# Patient Record
Sex: Female | Born: 1965 | Race: White | Hispanic: No | Marital: Married | Smoking: Never smoker
Health system: Southern US, Community
[De-identification: ages and names within clinical notes are randomized; demographics above are authoritative.]

## PROBLEM LIST (undated history)

## (undated) DIAGNOSIS — D649 Anemia, unspecified: Secondary | ICD-10-CM

## (undated) DIAGNOSIS — M1990 Unspecified osteoarthritis, unspecified site: Secondary | ICD-10-CM

## (undated) DIAGNOSIS — M179 Osteoarthritis of knee, unspecified: Secondary | ICD-10-CM

## (undated) DIAGNOSIS — F419 Anxiety disorder, unspecified: Secondary | ICD-10-CM

## (undated) DIAGNOSIS — K635 Polyp of colon: Secondary | ICD-10-CM

## (undated) DIAGNOSIS — K219 Gastro-esophageal reflux disease without esophagitis: Secondary | ICD-10-CM

## (undated) DIAGNOSIS — K5790 Diverticulosis of intestine, part unspecified, without perforation or abscess without bleeding: Secondary | ICD-10-CM

## (undated) DIAGNOSIS — Z8719 Personal history of other diseases of the digestive system: Secondary | ICD-10-CM

## (undated) DIAGNOSIS — I10 Essential (primary) hypertension: Secondary | ICD-10-CM

## (undated) DIAGNOSIS — M1710 Unilateral primary osteoarthritis, unspecified knee: Secondary | ICD-10-CM

## (undated) DIAGNOSIS — K602 Anal fissure, unspecified: Secondary | ICD-10-CM

## (undated) HISTORY — DX: Anal fissure, unspecified: K60.2

## (undated) HISTORY — DX: Anemia, unspecified: D64.9

## (undated) HISTORY — PX: KNEE ARTHROSCOPY W/ MENISCECTOMY: SHX1879

## (undated) HISTORY — PX: TONSILLECTOMY: SUR1361

## (undated) HISTORY — DX: Diverticulosis of intestine, part unspecified, without perforation or abscess without bleeding: K57.90

## (undated) HISTORY — DX: Essential (primary) hypertension: I10

---
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
## 1989-07-30 HISTORY — PX: BUNIONECTOMY: SHX129

## 1994-07-30 HISTORY — PX: TUBAL LIGATION: SHX77

## 1998-07-30 HISTORY — PX: SEPTOPLASTY: SUR1290

## 1999-05-30 ENCOUNTER — Other Ambulatory Visit: Admission: RE | Admit: 1999-05-30 | Discharge: 1999-05-30 | Payer: Self-pay | Admitting: Obstetrics and Gynecology

## 2000-06-05 ENCOUNTER — Other Ambulatory Visit: Admission: RE | Admit: 2000-06-05 | Discharge: 2000-06-05 | Payer: Self-pay | Admitting: Obstetrics and Gynecology

## 2000-06-13 ENCOUNTER — Encounter (INDEPENDENT_AMBULATORY_CARE_PROVIDER_SITE_OTHER): Payer: Self-pay | Admitting: Specialist

## 2000-06-13 ENCOUNTER — Ambulatory Visit (HOSPITAL_COMMUNITY): Admission: RE | Admit: 2000-06-13 | Discharge: 2000-06-13 | Payer: Self-pay | Admitting: General Surgery

## 2000-06-13 HISTORY — PX: ANAL FISSURE REPAIR: SHX2312

## 2001-12-16 ENCOUNTER — Other Ambulatory Visit: Admission: RE | Admit: 2001-12-16 | Discharge: 2001-12-16 | Payer: Self-pay | Admitting: Obstetrics and Gynecology

## 2003-03-23 ENCOUNTER — Other Ambulatory Visit: Admission: RE | Admit: 2003-03-23 | Discharge: 2003-03-23 | Payer: Self-pay | Admitting: Obstetrics and Gynecology

## 2004-02-10 ENCOUNTER — Ambulatory Visit (HOSPITAL_COMMUNITY): Admission: RE | Admit: 2004-02-10 | Discharge: 2004-02-10 | Payer: Self-pay | Admitting: Orthopedic Surgery

## 2004-04-17 ENCOUNTER — Other Ambulatory Visit: Admission: RE | Admit: 2004-04-17 | Discharge: 2004-04-17 | Payer: Self-pay | Admitting: Obstetrics and Gynecology

## 2004-12-11 ENCOUNTER — Ambulatory Visit (HOSPITAL_COMMUNITY): Admission: RE | Admit: 2004-12-11 | Discharge: 2004-12-11 | Payer: Self-pay | Admitting: Orthopedic Surgery

## 2005-10-11 ENCOUNTER — Ambulatory Visit: Payer: Self-pay | Admitting: Internal Medicine

## 2005-10-25 ENCOUNTER — Ambulatory Visit: Payer: Self-pay | Admitting: Internal Medicine

## 2005-10-30 ENCOUNTER — Encounter (INDEPENDENT_AMBULATORY_CARE_PROVIDER_SITE_OTHER): Payer: Self-pay | Admitting: Specialist

## 2005-10-30 ENCOUNTER — Ambulatory Visit: Payer: Self-pay | Admitting: Internal Medicine

## 2005-11-15 ENCOUNTER — Ambulatory Visit (HOSPITAL_COMMUNITY): Admission: RE | Admit: 2005-11-15 | Discharge: 2005-11-15 | Payer: Self-pay | Admitting: Obstetrics and Gynecology

## 2005-11-15 ENCOUNTER — Encounter (INDEPENDENT_AMBULATORY_CARE_PROVIDER_SITE_OTHER): Payer: Self-pay | Admitting: *Deleted

## 2005-11-15 HISTORY — PX: OTHER SURGICAL HISTORY: SHX169

## 2005-12-12 ENCOUNTER — Ambulatory Visit: Payer: Self-pay | Admitting: Internal Medicine

## 2006-11-18 ENCOUNTER — Ambulatory Visit (HOSPITAL_COMMUNITY): Admission: RE | Admit: 2006-11-18 | Discharge: 2006-11-18 | Payer: Self-pay | Admitting: Specialist

## 2008-04-12 DIAGNOSIS — Z862 Personal history of diseases of the blood and blood-forming organs and certain disorders involving the immune mechanism: Secondary | ICD-10-CM

## 2008-04-12 DIAGNOSIS — K219 Gastro-esophageal reflux disease without esophagitis: Secondary | ICD-10-CM

## 2008-04-12 DIAGNOSIS — Z8719 Personal history of other diseases of the digestive system: Secondary | ICD-10-CM

## 2008-04-12 DIAGNOSIS — K2980 Duodenitis without bleeding: Secondary | ICD-10-CM | POA: Insufficient documentation

## 2008-04-12 DIAGNOSIS — K589 Irritable bowel syndrome without diarrhea: Secondary | ICD-10-CM

## 2008-04-14 ENCOUNTER — Ambulatory Visit: Payer: Self-pay | Admitting: Internal Medicine

## 2008-04-14 DIAGNOSIS — K602 Anal fissure, unspecified: Secondary | ICD-10-CM | POA: Insufficient documentation

## 2008-04-14 DIAGNOSIS — R1013 Epigastric pain: Secondary | ICD-10-CM

## 2008-04-14 DIAGNOSIS — K3189 Other diseases of stomach and duodenum: Secondary | ICD-10-CM

## 2008-04-14 DIAGNOSIS — R1012 Left upper quadrant pain: Secondary | ICD-10-CM

## 2008-04-21 ENCOUNTER — Ambulatory Visit (HOSPITAL_COMMUNITY): Admission: RE | Admit: 2008-04-21 | Discharge: 2008-04-21 | Payer: Self-pay | Admitting: Internal Medicine

## 2008-04-23 ENCOUNTER — Encounter: Payer: Self-pay | Admitting: Internal Medicine

## 2008-04-29 ENCOUNTER — Ambulatory Visit: Payer: Self-pay | Admitting: Internal Medicine

## 2008-04-29 LAB — CONVERTED CEMR LAB
ALT: 19 units/L (ref 0–35)
Basophils Absolute: 0 10*3/uL (ref 0.0–0.1)
Basophils Relative: 0.7 % (ref 0.0–3.0)
Creatinine, Ser: 1 mg/dL (ref 0.4–1.2)
GFR calc non Af Amer: 65 mL/min
Hemoglobin: 12.8 g/dL (ref 12.0–15.0)
Monocytes Absolute: 0.3 10*3/uL (ref 0.1–1.0)
Neutro Abs: 4.2 10*3/uL (ref 1.4–7.7)
Neutrophils Relative %: 69.7 % (ref 43.0–77.0)
RBC: 4.29 M/uL (ref 3.87–5.11)
Retic Ct Pct: 1.7 % (ref 0.4–3.1)
Sodium: 141 meq/L (ref 135–145)

## 2008-05-31 ENCOUNTER — Ambulatory Visit: Payer: Self-pay | Admitting: Internal Medicine

## 2008-06-02 ENCOUNTER — Ambulatory Visit: Payer: Self-pay | Admitting: Cardiovascular Disease

## 2008-06-02 ENCOUNTER — Ambulatory Visit: Payer: Self-pay | Admitting: Internal Medicine

## 2008-06-02 ENCOUNTER — Ambulatory Visit (HOSPITAL_COMMUNITY): Admission: RE | Admit: 2008-06-02 | Discharge: 2008-06-02 | Payer: Self-pay | Admitting: Cardiology

## 2008-06-03 ENCOUNTER — Ambulatory Visit: Payer: Self-pay

## 2008-06-03 ENCOUNTER — Encounter: Payer: Self-pay | Admitting: Cardiovascular Disease

## 2008-06-03 HISTORY — PX: TRANSTHORACIC ECHOCARDIOGRAM: SHX275

## 2008-06-05 ENCOUNTER — Encounter: Payer: Self-pay | Admitting: Internal Medicine

## 2008-06-07 ENCOUNTER — Telehealth: Payer: Self-pay | Admitting: Internal Medicine

## 2008-06-10 ENCOUNTER — Inpatient Hospital Stay (HOSPITAL_COMMUNITY): Admission: RE | Admit: 2008-06-10 | Discharge: 2008-06-12 | Payer: Self-pay | Admitting: Specialist

## 2008-06-10 HISTORY — PX: OTHER SURGICAL HISTORY: SHX169

## 2008-10-12 ENCOUNTER — Telehealth: Payer: Self-pay | Admitting: Internal Medicine

## 2008-10-14 ENCOUNTER — Ambulatory Visit: Payer: Self-pay | Admitting: Internal Medicine

## 2008-10-14 DIAGNOSIS — R110 Nausea: Secondary | ICD-10-CM

## 2008-10-14 DIAGNOSIS — K209 Esophagitis, unspecified: Secondary | ICD-10-CM

## 2008-10-15 DIAGNOSIS — R161 Splenomegaly, not elsewhere classified: Secondary | ICD-10-CM | POA: Insufficient documentation

## 2008-10-25 ENCOUNTER — Ambulatory Visit: Payer: Self-pay | Admitting: Internal Medicine

## 2008-10-25 ENCOUNTER — Encounter: Payer: Self-pay | Admitting: Internal Medicine

## 2008-10-27 ENCOUNTER — Encounter: Payer: Self-pay | Admitting: Internal Medicine

## 2008-10-28 ENCOUNTER — Telehealth: Payer: Self-pay | Admitting: Internal Medicine

## 2010-08-20 ENCOUNTER — Encounter: Payer: Self-pay | Admitting: Specialist

## 2010-12-12 NOTE — Assessment & Plan Note (Signed)
[facility]                                 ON-CALL NOTE   NAME:Cook, Jill HEGNER                    MRN:          [id number]  DATE:06/03/2008                            DOB:          10/07/1965    She is a patient of Dr. Earney Hamburg.  I received a call from Jill Cook this morning.  She recently saw Dr. Clifton James in clinic on  June 02, 2008, with complaints of atypical chest discomfort.  At that  time, he performed a 2-D echocardiogram which I have been able to pull  up and see that this is completely normal.  She also underwent Myoview  stress testing; unfortunately, I do not have the results of that test.  This morning, she complains of some discomfort in her chest and in her  back that is worse with deep breathing and coughing.  It started last  night prior to going to bed and she was able to sleep well, but then  awoke this morning and it was still there in a mild fashion.  She is  fairly jovial on the phone and is just asking what she should do.  She  gives no indication that she is in any acute distress at all.  I  recommended that it is difficult to know whether or not her symptoms are  cardiac in nature.  I went over her echocardiogram with her to reassure  her some.  Finally, I recommended that if she is concerned about her  symptoms that she should come into the ED for further evaluation.  She  explained that she is not overly concerned.  She was just was not sure  what to do and that she will stay at home for now and see if it gets  better, but if not, then she will come into the ED.     Nicolasa Ducking, ANP  Electronically Signed    CB/MedQ  DD: 06/06/2008  DT: 06/06/2008  Job #: [id number]

## 2010-12-12 NOTE — Assessment & Plan Note (Signed)
[facility]                            CARDIOLOGY OFFICE NOTE   NAME:Guertin, DAIYA TAMER                    MRN:          [id number]  DATE:06/02/2008                            DOB:          September 14, 1965    PRIMARY CARE PHYSICIAN:  Ernestina Penna, MD   REASON FOR CONSULTATION:  Chest pain.   HISTORY OF PRESENT ILLNESS:  Ms. Simkin is a pleasant 45 year old  Caucasian female with a past medical history significant for  hyperlipidemia, gastroesophageal reflux disease, duodenitis,  esophagitis, irritable bowel syndrome, anemia and osteoarthritis of her  right knee who presents today for further evaluation of left-sided chest  pain.  The patient tells me that for the last month, she has been having  sharp left-sided chest pain that makes her bend over and clutch her  chest.  The pain lasts for 15-30 minutes and is worsened with deep  inspiration.  She denies any associated shortness of breath or  diaphoresis, but does occasionally have nausea while she is experiencing  this pain.  The pain almost always occurs while she is at rest.  Approximately 2 weeks ago, she started noticing a change in the  character of the pain.  The pain is now a heavy pressure-like sensation  over her center chest without radiation.  There are no specific  associated signs with the chest pain over the last several weeks.  The  patient has a complex history of GI problems, which could explain some  of her current symptoms.  She was most recently seen by Dr. Lina Sar  of gastroenterology for further evaluation of her left upper quadrant  abdominal pain.  Ultrasound revealed splenomegaly in September; however,  her gallbladder and liver were normal in size.  The patient has been  started on Aciphex 20 mg a day for possible gastroesophageal reflux  disease as a explanation of her pain.  Dr. Juanda Chance is continuing a workup  of the abdominal pain which is to include a CT scan of the  abdomen.  The  patient has no other complaints at the current time.   The patient has a planned partial right knee replacement for Friday,  June 04, 2008.   PAST MEDICAL HISTORY:  1. Hyperlipidemia.  2. Gastroesophageal reflux disease.  3. Duodenitis.  4. Esophagitis.  5. Irritable bowel syndrome.  6. Anemia.  7. Osteoarthritis of the right knee with plans for partial knee      replacement later this week.  8. Splenomegaly of unknown etiology.   PAST SURGICAL HISTORY:  Tonsillectomy, nasal surgery, bilateral tubal  ligation, C-section, bunion surgery and knee arthroscopic surgery.   ALLERGIES:  CODEINE, MORPHINE, DILAUDID.   CURRENT MEDICATIONS:  1. Aciphex 20 mg once daily.  2. Multivitamin once daily.  3. Lovaza 1 g 2 pills once daily (the multivitamin and Lovaza are      currently on hold).   SOCIAL HISTORY:  The patient denies use of tobacco or illicit drugs.  She does occasionally have an alcoholic beverage.  She is married and  has 3 children.  She is employed in the desk  job and Insurance account manager.   FAMILY HISTORY:  The patient's mother is alive and has diabetes  mellitus.  Her father is alive and had a myocardial infarction at age of  52.  She has a younger sister and a younger brother who are both  healthy.   REVIEW OF SYSTEMS:  As stated in the history present illness and is  otherwise negative.   PHYSICAL EXAMINATION:  VITAL SIGNS:  Blood pressure 136/98, pulse 87 and  regular, respirations 12 and nonlabored.  GENERAL:  She is a pleasant young Caucasian female in no acute distress.  She is alert and oriented x3.  PSYCHIATRIC:  Mood and affect are normal.  NEUROLOGICAL:  No focal neurological deficits.  MUSCULOSKELETAL:  Muscle strength and tone are normal.  SKIN:  Warm and dry.  HEENT:  Normal.  NECK:  No JVD.  No carotid bruits.  No lymphadenopathy.  No thyromegaly.  LUNGS:  Clear to auscultation bilaterally without wheezes, rhonchi, or  crackles noted.   CARDIOVASCULAR:  Regular rate and rhythm without murmurs, gallops, or  rubs noted.  ABDOMEN:  Soft, nontender, nondistended.  Bowel sounds are present.  EXTREMITIES:  No evidence of edema.  Pulses are 2+ in all extremities.  CHEST WALL:  There is no reproducible chest pain with palpation.   DIAGNOSTIC STUDIES:  1. A 12-lead EKG obtained in our office today shows normal sinus      rhythm with a ventricular rate of 87 beats per minute and sinus      arrhythmia.  This is otherwise a normal EKG.  2. Chest x-ray obtained on June 02, 2008, is negative for acute      cardiopulmonary process.  The lung fields are both clear.  The      heart and mediastinal contours are normal.  All osseous structures      are normal.   ASSESSMENT AND PLAN:  This is a pleasant 45 year old Caucasian female  with a family history of premature coronary artery disease and a  personal history of obesity and hyperlipidemia who presents with  complaints of substernal and left-sided chest pressure and heaviness.  The pain has some atypical features and that it lasts for several hours  at times, occurs at rest and is not associated with exertion.  This pain  is also made worse at times by deep inspiration.  There are some  concerning features about the pain, however, and because of this I think  it will be most appropriate to obtain surface echocardiogram to evaluate  her left ventricular function and to rule out any pericardial effusion.  I would also like to get an adenosine Myoview to rule out any myocardial  ischemia.  We will perform this test at the earliest convenience for the  patient and we will call her with the results.  I will make no  medication changes at the current time.  We will communicate the results  with the patient and to her surgeon when the results are available.  Once again, the patient is planning to have a partial right knee  replacement done as soon as she has clearance from cardiology and  the  gastroenterology team.     Verne Carrow, MD  Electronically Signed    CM/MedQ  DD: 06/02/2008  DT: 06/03/2008  Job #: [id number]   cc:   Hedwig Morton. Juanda Chance, MD  Ernestina Penna, M.D.

## 2010-12-12 NOTE — Op Note (Signed)
Jill Cook, OSORTO             ACCOUNT NO.:  [id number]   MEDICAL RECORD NO.:  [id number]          PATIENT TYPE:  INP   LOCATION:  1608                         FACILITY:  [facility]   PHYSICIAN:  Erasmo Leventhal, M.D.DATE OF BIRTH:  06/03/1966   DATE OF PROCEDURE:  06/10/2008  DATE OF DISCHARGE:                               OPERATIVE REPORT   PREOPERATIVE DIAGNOSIS:  Right knee patellofemoral osteoarthritis.   POSTOPERATIVE DIAGNOSIS:  Right knee patellofemoral osteoarthritis.   PROCEDURE:  Right knee patellofemoral arthroplasty.   SURGEON:  Erasmo Leventhal, M.D.   ASSISTANT:  Jaquelyn Bitter. Chabon, P.A.   ANESTHESIA:  Femoral nerve block with general  anesthesia was rule  femoral nerve block with general, Dr. Shireen Quan.   BLOOD LOSS:  Less than 50 mL.   DRAINS:  One Hemovac.   COMPLICATIONS:  None.   TOURNIQUET TIME:  Seventy minutes at 300 mmHg.   COMPLICATIONS:  None.   DISPOSITION:  PACU stable.   OPERATIVE IMPLANTS:  DePuy Sigma size 1 patellofemoral trochlea and a 35  mm all polyethylene patella.   OPERATIVE DETAILS:  The patient counseled in holding after correct side  was identified.  Regional anesthetic was administered.  She was then  taken to the operating room and placed in the position.  General  anesthesia was administered and well padded bump.  Foley catheter placed  in sterile technique by OR circulating nurse.  Right lower extremity was  elevated, prepped with DuraPrep and draped in the sterile fashion.  Standard Esmarch  tourniquet inflated to 300 mmHg.  Straight midline  incision was made in the skin and subcutaneous tissue.  Medial  parapatellar arthrotomy performed making sure to protect the articular  cartilage that was uninvolved and also the menisci.  The knee was  inspected through this arthrotomy.  There were no others areas of  abnormality noted and grade 4 chondromalacia patella and softening of  femoral trochlea..  The patella was  resected initially after measuring  the appropriate thickness and a size 35 locking holes were made and a  shim was placed.   A 2 mm pilot hole was made for standard technique being 7 mm anterior to  the intercondylar notch cortical bone superiorly leaving a 2 mm rim.  Distal femoral cutting block was applied.  We made appropriate varus and  valgus and rotation based upon Whiteside's line and also epicondylar  axis.  The sagittal cuts were made, transverse cut was made at the  appropriate level making sure it was nice and flush.  Next a jig was  then placed utilizing the router and appropriate of bone was resected.  It was removed, trimmed down with rongeurs and trial was placed  following that fixation.   The knee was copiously irrigated with pulsatile lavage and utilizing  modern cement technique, all components were cemented into place.  We  used a size 35 patella with a size 1 femoral trochlea making sure we had  appropriate balance, rotated and set and appropriate coverage  throughout.  After cement had cured, excess cement removed.  Tracking  was anatomic.  She  had already undergone lateral release with previous  surgical procedure and did not require lateral release this time.  We  had anatomic tracking.  Again pulsatile lavage irrigated the knee.  The  arthrotomy was closed and the knee flexed  after drain was placed.  This  done with Vicryl suture.  Subcu with Vicryl and skin with subcuticular  Monocryl suture.  Drain was hooked to suction.  A sterile dressing  applied.  Tourniquet deflated.  Ice packs applied.  Knee immobilizer.  She tolerated the procedure well.  There were no complications or  problems.  She was then awakened and taken from the operating room in  stable condition.   Note:  She had received preoperative antibiotics.   To help with surgical technique and good decision making, Mr. Leilani Able PA-C assistance was needed for the entire case.            ______________________________  Erasmo Leventhal, M.D.     RAC/MEDQ  D:  06/10/2008  T:  06/10/2008  Job:  [id number]

## 2010-12-15 NOTE — Discharge Summary (Signed)
Jill Cook, Jill Cook             ACCOUNT NO.:  [id number]   MEDICAL RECORD NO.:  [id number]          PATIENT TYPE:  INP   LOCATION:  1608                         FACILITY:  [facility]   PHYSICIAN:  Erasmo Leventhal, M.D.DATE OF BIRTH:  01-10-1966   DATE OF ADMISSION:  06/10/2008  DATE OF DISCHARGE:  06/12/2008                               DISCHARGE SUMMARY   ADMITTING DIAGNOSIS:  Right knee patellofemoral arthritis.   DISCHARGE DIAGNOSIS:  Same.   OPERATION:  Right knee patellofemoral arthroplasty.   BRIEF HISTORY:  This is a 45 year old lady with a history of  patellofemoral arthritis with failure of conservative treatment  including scope and lateral release to alleviate her pain.  After  discussion of treatment benefits, risks and options, the patient now  scheduled for patellofemoral arthroplasty of the right knee.   LABORATORY VALUES:  Admission CBC within normal limits.  Admission BMET  showed a sodium slightly low at 133, glucose high at 120.  Hemoglobin/hematocrit on discharge 11.8 and 34.4, sodium was back up to  normal at 138 on discharge.  Urinalysis within normal limits.   COURSE IN THE HOSPITAL:  The patient tolerated the op procedure well.  First postoperative day she was feeling okay, vital signs stable,  afebrile, CBC normal, lungs clear, heart sounds normal, bowel sounds  active, calves negative, dressing was dry, drain was removed and she was  started on physical therapy.  Second postoperative day she was  comfortable, vital signs stable, afebrile, labs were stable, her calves  were negative, her dressing was changed, her wound was benign,  neurovascular status was intact and plans were made for discharge today  after her physical therapy if she did well.   CONDITION ON DISCHARGE:  Improved.   DISCHARGE MEDICATIONS:  1. Oxycodone 5 mg one to two q.6 h p.r.n. pain.  2. Robaxin 500 mg one p.o. q.8 h p.r.n. spasm.  3. Aspirin 325 mg one pill twice a day for  3 weeks.   DISCHARGE INSTRUCTIONS:  She is to do ankle pumps, tighten and relax the  leg muscles frequently each hour.  She is to ambulate with assistance in  her crutches, wear brace when up and about.  Call the office for recheck  in 2 weeks or call sooner p.r.n. problems.  She is to keep the wound  clean and dry for a total of 3 weeks and she is to follow a low sodium,  heart healthy diet.      Jaquelyn Bitter. Chabon, P.A.    ______________________________  Erasmo Leventhal, M.D.    SJC/MEDQ  D:  06/30/2008  T:  06/30/2008  Job:  [id number]

## 2010-12-15 NOTE — H&P (Signed)
NAME:  Jill Cook, Jill Cook             ACCOUNT NO.:  [id number]   MEDICAL RECORD NO.:  [id number]          PATIENT TYPE:  AMB   LOCATION:  SDC                           FACILITY:  [facility]   PHYSICIAN:  Duke Salvia. Marcelle Overlie, M.D.DATE OF BIRTH:  [birth year]   DATE OF ADMISSION:  DATE OF DISCHARGE:                                HISTORY & PHYSICAL   CHIEF COMPLAINT:  Menorrhagia.   HISTORY OF PRESENT ILLNESS:  A 45 year old G2, P2 has 3 living children,  prior tubal, 1 set of twins, has been bothered with menorrhagia that has not  responded to conservative measures. She underwent FSG in our office dated  October 26, 2005 which showed a 3.1 x 3.2 intramural fibroid with a small  polyp noted. She presents now for [facility] hysteroscopy and NovaSure endometrial  ablation. This procedure including risks of bleeding, infection, adjacent  organ injury and the possible need for open or additional surgery were all  reviewed with her which she understands and accepts.   ALLERGIES:  DILAUDID, CODEINE and MORPHINE.   CURRENT MEDICATIONS:  None.   OBSTETRICAL HISTORY:  One vaginal delivery, one Cesarean section, tubal  ligation.   FAMILY HISTORY:  Significant for great-grandmother with twin gestation.  Otherwise negative.   PHYSICAL EXAMINATION:  VITAL SIGNS:  Temperature 98.2, blood pressure  120/78.  HEENT:  Unremarkable.  NECK:  Supple without masses.  LUNGS:  Clear.  CARDIOVASCULAR:  Regular rate and rhythm without murmurs, rubs, or gallops  noted.  BREASTS:  Without masses.  ABDOMEN:  Soft, flat, nontender.  PELVIC:  Normal external genitalia. Vagina and cervix clear. Uterus in good  position, normal size. Adnexa negative.   IMPRESSION:  Symptomatic leiomyoma, endometrial polyp, menorrhagia.   PLAN:  D&C hysteroscopy with NovaSure endometrial ablation. Procedure and  risks reviewed as above.      Richard M. Marcelle Overlie, M.D.  Electronically Signed     RMH/MEDQ  D:  11/12/2005  T:  11/12/2005   Job:  [id number]

## 2010-12-15 NOTE — Op Note (Signed)
NAME:  Jill Cook, Jill Cook             ACCOUNT NO.:  [id number]   MEDICAL RECORD NO.:  [id number]          PATIENT TYPE:  AMB   LOCATION:  SDC                           FACILITY:  [facility]   PHYSICIAN:  Duke Salvia. Marcelle Overlie, M.D.DATE OF BIRTH:  1966/03/16   DATE OF PROCEDURE:  11/15/2005  DATE OF DISCHARGE:                                 OPERATIVE REPORT   PREOPERATIVE DIAGNOSIS:  Endometrial polyp, menorrhagia with abnormal  uterine bleeding.  Leiomyoma.   POSTOPERATIVE DIAGNOSIS:  Endometrial polyp, menorrhagia with abnormal  uterine bleeding.  Leiomyoma.   OPERATION PERFORMED:  Dilation and curettage, hysteroscopy, NovaSure EMA.   SURGEON:  Duke Salvia. Marcelle Overlie, M.D.   ANESTHESIA:  General endotracheal.   COMPLICATIONS:  None.   DRAINS:  In and out Foley catheter.   ESTIMATED BLOOD LOSS:  Minimal.   SPECIMENS:  Endometrial curettings with endometrial polyp.   DESCRIPTION OF PROCEDURE:  The patient was taken to the operating room.  After an adequate level of general endotracheal anesthesia was obtained, the  patient was placed in stirrups.  The perineum and vagina were prepped and  draped with Betadine.  The bladder was drained.  Examination under  anesthesia carried out.  The uterus was mid posterior, normal size, adnexa  negative.  Speculum was positioned.  Cervix grasped with a tenaculum.  Paracervical block infiltrated at 4, 8, 10 and 2. A deep paracervical block  total of 16 mL 1% Xylocaine after negative aspiration.  The uterus was then  sounded to 9.5 cm.  Cervical length of 3.0 progressively dilated to a 29  Pratt dilator.  A 7 mm continuous flow hysteroscope was then inserted.  A  small benign appearing posterior wall polyp was noted.  The remainder of the  cavity was normal.  The polyp was removed by dilation and curettage.  The  scope was reinserted, the cavity irrigated and noted to be clear.  NovaSure  was then carried out per protocol without complication.  A width of  4.5,  power setting of 161 for 112 seconds.  She tolerated this well, did receive  Toradol intraoperative and went to recovery room in good condition.      Richard M. Marcelle Overlie, M.D.  Electronically Signed     RMH/MEDQ  D:  11/15/2005  T:  11/16/2005  Job:  [id number]

## 2010-12-15 NOTE — H&P (Signed)
NAME:  Jill, Cook             ACCOUNT NO.:  [id number]   MEDICAL RECORD NO.:  [id number]          PATIENT TYPE:  INP   LOCATION:                               [facility]ACILITY[facility]   PHYSICIAN:  Erasmo Leventhal, M.D.DATE OF BIRTH:  [birth year]   DATE OF ADMISSION:  06/04/2008  DATE OF DISCHARGE:                              HISTORY & PHYSICAL   CHIEF COMPLAINT:  Right knee patellofemoral arthritis.   HISTORY OF PRESENT ILLNESS:  This is a 45 year old lady with a history  patellofemoral arthritis with failure of conservative treatment,  including scope and lateral release to alleviate her pain.  Due to daily  pain and discomfort, she comes in now for H and P prior to  patellofemoral arthroplasty of her right knee.  The surgery, risks,  benefits, and aftercare were discussed in detail with the patient,  questions invited and answered, and surgery to go ahead as scheduled.   PAST MEDICAL HISTORY:   DRUG ALLERGIES:  CODEINE, DILANTIN, and MORPHINE, all with nausea,  vomiting, and hives.   CURRENT MEDICATIONS:  1. Nexium one daily.  2. Fish oil.  3. Vitamins.   PAST SURGICAL HISTORY:  1. Tonsillectomy.  2. Bunionectomy.  3. C-section.  4. Tubal ligation.  5. Septoplasty.  6. Lateral release surgery of the right knee.   SERIOUS MEDICAL ILLNESSES:  Include hypercholesterolemia.   FAMILY HISTORY:  Positive for MI, coronary artery disease, diabetes, and  hypertension.   SOCIAL HISTORY:  The patient is married.  She works in Insurance account manager.  She  does not smoke and drinks occasionally.   REVIEW OF SYSTEMS:  CENTRAL NERVOUS SYSTEM:  Negative for headache,  blurred vision, or dizziness.  PULMONARY:  Negative for shortness  breath, PND, or orthopnea.  CARDIOVASCULAR:  Positive for  hypercholesterolemia.  Negative for chest pain or palpitation.  GI:  Positive for GERD.  GU:  Negative for urinary tract difficulty.  MUSCULOSKELETAL:  Positive as in HPI.   PHYSICAL EXAMINATION:   VITAL SIGNS:  BP 132/78, pulse 82 and regular,  respirations 16.  GENERAL APPEARANCE:  This is a well-developed, well-nourished lady in no  acute distress.  HEENT:  Head normocephalic.  Nose patent.  Ears patent.  Pupils are  equal, round, and react to light.  Throat without injection.  NECK:  Supple without adenopathy.  Carotids 2+ without bruit.  CHEST:  Clear to auscultation.  No rales or rhonchi.  Respirations 16.  HEART:  Regular rate and rhythm at 82 beats per minute without murmur.  ABDOMEN:  Soft with active bowel sounds.  No masses or organomegaly.  NEUROLOGIC:  The patient is alert and oriented to time, place, and  person.  Cranial nerves II through XII are grossly intact.  EXTREMITIES:  Right knee with significant patellofemoral crepitation.  Positive inhibition test and pain with any loaded extension.  Neurovascular status intact.  Dorsalis pedis and posterior tibialis  pulses are 2+.   X-rays show patellofemoral arthritis, right knee.   IMPRESSION:  Patellofemoral osteoarthritis, right knee.   PLAN:  Patellofemoral arthroplasty, right knee.      [name], P.A.    ______________________________  Erasmo Leventhal, M.D.    SJC/MEDQ  D:  05/10/2008  T:  05/10/2008  Job:  [id number]

## 2010-12-15 NOTE — Op Note (Signed)
[facility]  Patient:    Jill Cook, Jill Cook                    MRN: [id number] Proc. Date: 06/13/00 Adm. Date:  [id number] Attending:  Carson Myrtle                           Operative Report  PREOPERATIVE DIAGNOSIS:  Anal fissure.  POSTOPERATIVE DIAGNOSIS:  Anal fissure.  PROCEDURE:  Repair of anal fissure.  SURGEON:  Timothy E. Earlene Plater, M.D.  ANESTHESIA:  General.  INDICATIONS:  This is a 45 year old female with a persistent anal fissure and failure to respond to conservative management.  She wishes to proceed with a surgical repair.  This has been carefully explained.  DESCRIPTION OF PROCEDURE:  The patient was brought to the operating room and placed supine.  LMA anesthesia was provided, and she was then placed in the lithotomy position.  Inspection and preparation of the perineal area was accomplished.  Relaxation of the perineum was notable, understanding that this patient although under general anesthesia had no muscle relaxant.  The anus was patchulous.  There was a moderate rectocele and general relaxation.  I could identify the IC groove and palpate the internal sphincter; but, because of the existing relaxation I was hesitant to perform an internal sphincterotomy as is usually done.  Therefore, after careful inspection of the perianal area and enterorectum by anoscopy, I simply debrided the fissure and excised the external tag which was prominent.  I then cauterized the edges and this completed the repair on this particular situation.  Gelfoam gauze and a dry sterile dressing was applied.  She tolerated it well and was removed to recovery room in good condition.  Written and verbal instructions were given to the patient and her husband. She will be seen in follow up as an outpatient. DD:  06/13/00 TD:  06/13/00 Job: [id number]

## 2011-05-01 LAB — CBC
HCT: 34.4 — ABNORMAL LOW
HCT: 36.7
Hemoglobin: 11.8 — ABNORMAL LOW
Hemoglobin: 12.7
Hemoglobin: 12.7
MCHC: 34.2
MCV: 87.6
Platelets: 177
Platelets: 206
RBC: 3.92
RBC: 4.32
WBC: 6.3
WBC: 8.8

## 2011-05-01 LAB — BASIC METABOLIC PANEL
BUN: 3 — ABNORMAL LOW
Calcium: 8.6
Calcium: 8.7
Chloride: 102
Creatinine, Ser: 0.76
GFR calc Af Amer: >60
GFR calc non Af Amer: >60
Glucose, Bld: 107 — ABNORMAL HIGH
Glucose, Bld: 120 — ABNORMAL HIGH
Potassium: 3.8
Sodium: 133 — ABNORMAL LOW

## 2011-05-01 LAB — CROSSMATCH
ABO/RH(D): A POS
Antibody Screen: NEGATIVE

## 2011-05-01 LAB — URINALYSIS, ROUTINE W REFLEX MICROSCOPIC
Bilirubin Urine: NEGATIVE
Glucose, UA: NEGATIVE
Ketones, ur: NEGATIVE
Ketones, ur: NEGATIVE
Protein, ur: NEGATIVE
Specific Gravity, Urine: 1.004 — ABNORMAL LOW
Urobilinogen, UA: 0.2
pH: 7
pH: 7

## 2011-05-01 LAB — DIFFERENTIAL
Basophils Absolute: 0
Basophils Relative: 1
Eosinophils Absolute: 0.1
Eosinophils Relative: 1
Lymphocytes Relative: 27
Monocytes Absolute: 0.3
Neutro Abs: 4.1
Neutrophils Relative %: 66

## 2011-05-01 LAB — COMPREHENSIVE METABOLIC PANEL
Calcium: 9.2
Creatinine, Ser: 0.82
GFR calc Af Amer: >60
Total Bilirubin: 0.8
Total Protein: 6.9

## 2011-05-01 LAB — ABO/RH: ABO/RH(D): A POS

## 2012-06-23 ENCOUNTER — Ambulatory Visit: Payer: Self-pay | Admitting: Otolaryngology

## 2012-06-30 ENCOUNTER — Encounter: Payer: Self-pay | Admitting: Otolaryngology

## 2012-07-30 ENCOUNTER — Encounter: Payer: Self-pay | Admitting: Otolaryngology

## 2013-05-11 ENCOUNTER — Other Ambulatory Visit: Payer: Self-pay | Admitting: Physician Assistant

## 2013-05-11 ENCOUNTER — Encounter: Payer: Self-pay | Admitting: Family Medicine

## 2013-05-11 NOTE — Telephone Encounter (Signed)
Medication refill for one time only.  Patient needs to be seen.  Letter sent for patient to call and schedule 

## 2013-06-21 ENCOUNTER — Other Ambulatory Visit: Payer: Self-pay | Admitting: Physician Assistant

## 2013-06-22 NOTE — Telephone Encounter (Signed)
Refill denied.  Pt aware she must be seen for refills

## 2013-06-23 ENCOUNTER — Telehealth: Payer: Self-pay | Admitting: Family Medicine

## 2013-06-23 ENCOUNTER — Encounter: Payer: Self-pay | Admitting: Family Medicine

## 2013-06-23 NOTE — Telephone Encounter (Signed)
Pt NTBS  Another letter sent to tell her so.

## 2013-06-24 ENCOUNTER — Other Ambulatory Visit: Payer: Self-pay | Admitting: Physician Assistant

## 2013-07-13 ENCOUNTER — Other Ambulatory Visit: Payer: BC Managed Care – PPO

## 2013-07-13 DIAGNOSIS — F411 Generalized anxiety disorder: Secondary | ICD-10-CM

## 2013-07-13 DIAGNOSIS — I10 Essential (primary) hypertension: Secondary | ICD-10-CM

## 2013-07-13 LAB — COMPLETE METABOLIC PANEL WITH GFR
ALT: 16 U/L (ref 0–35)
AST: 15 U/L (ref 0–37)
Albumin: 4.6 g/dL (ref 3.5–5.2)
BUN: 12 mg/dL (ref 6–23)
Calcium: 9.9 mg/dL (ref 8.4–10.5)
Chloride: 102 mEq/L (ref 96–112)
Potassium: 4.4 mEq/L (ref 3.5–5.3)
Total Bilirubin: 0.5 mg/dL (ref 0.3–1.2)
Total Protein: 6.8 g/dL (ref 6.0–8.3)

## 2013-07-13 LAB — CBC WITH DIFFERENTIAL/PLATELET
Basophils Absolute: 0 10*3/uL (ref 0.0–0.1)
Basophils Relative: 1 % (ref 0–1)
Eosinophils Absolute: 0.1 10*3/uL (ref 0.0–0.7)
Eosinophils Relative: 2 % (ref 0–5)
MCH: 30 pg (ref 26.0–34.0)
MCHC: 34.9 g/dL (ref 30.0–36.0)
MCV: 85.7 fL (ref 78.0–100.0)
Platelets: 190 10*3/uL (ref 150–400)
RDW: 14 % (ref 11.5–15.5)

## 2013-07-13 LAB — LIPID PANEL: LDL Cholesterol: 95 mg/dL (ref 0–99)

## 2013-07-13 LAB — TSH: TSH: 1.63 u[IU]/mL (ref 0.350–4.500)

## 2013-07-16 ENCOUNTER — Ambulatory Visit (INDEPENDENT_AMBULATORY_CARE_PROVIDER_SITE_OTHER): Payer: BC Managed Care – PPO | Admitting: Family Medicine

## 2013-07-16 ENCOUNTER — Encounter: Payer: Self-pay | Admitting: Family Medicine

## 2013-07-16 VITALS — BP 110/74 | HR 78 | Temp 98.1°F | Resp 16 | Ht 66.0 in | Wt 212.0 lb

## 2013-07-16 DIAGNOSIS — Z0000 Encounter for general adult medical examination without abnormal findings: Secondary | ICD-10-CM

## 2013-07-16 DIAGNOSIS — K5790 Diverticulosis of intestine, part unspecified, without perforation or abscess without bleeding: Secondary | ICD-10-CM | POA: Insufficient documentation

## 2013-07-16 NOTE — Progress Notes (Signed)
Subjective:    Patient ID: Jill Cook, female    DOB: [birth year], 47 y.o.   MRN: [id number]  HPI Patient today for complete physical exam. She is concerned because she's had a recent cold with rhinorrhea and congestion. Otherwise she is doing well. She does complain of daily diarrhea over the last 2 years. It does seem to be triggered by eating bread and gluten-containing foods. She denies any melena or weight loss or fevers or hematochezia. She had a colonoscopy that was benign. She's previously been diagnosed with possible irritable bowel syndrome.  She also has a history of GERD, esophagitis, dyspepsia, and duodenitis.  Otherwise she is doing well. Her most recent lab work is listed below: Lab on 07/13/2013  Component Date Value Range Status  . Sodium 07/13/2013 140  135 - 145 mEq/L Final  . Potassium 07/13/2013 4.4  3.5 - 5.3 mEq/L Final  . Chloride 07/13/2013 102  96 - 112 mEq/L Final  . CO2 07/13/2013 28  19 - 32 mEq/L Final  . Glucose, Bld 07/13/2013 96  70 - 99 mg/dL Final  . BUN 62/13/0865 12  6 - 23 mg/dL Final  . Creat 78/46/9629 0.89  0.50 - 1.10 mg/dL Final  . Total Bilirubin 07/13/2013 0.5  0.3 - 1.2 mg/dL Final  . Alkaline Phosphatase 07/13/2013 54  39 - 117 U/L Final  . AST 07/13/2013 15  0 - 37 U/L Final  . ALT 07/13/2013 16  0 - 35 U/L Final  . Total Protein 07/13/2013 6.8  6.0 - 8.3 g/dL Final  . Albumin 52/84/1324 4.6  3.5 - 5.2 g/dL Final  . Calcium 40/04/2724 9.9  8.4 - 10.5 mg/dL Final  . GFR, Est African American 07/13/2013 89   Final  . GFR, Est Non African American 07/13/2013 77   Final   Comment:                            The estimated GFR is a calculation valid for adults (>=37 years old)                          that uses the CKD-EPI algorithm to adjust for age and sex. It is                            not to be used for children, pregnant women, hospitalized patients,                             patients on dialysis, or with rapidly changing kidney  function.                          According to the NKDEP, eGFR >89 is normal, 60-89 shows mild                          impairment, 30-59 shows moderate impairment, 15-29 shows severe                          impairment and <15 is ESRD.                             . WBC 07/13/2013 5.0  4.0 - 10.5 K/uL Final  . RBC 07/13/2013 4.34  3.87 - 5.11 MIL/uL Final  . Hemoglobin 07/13/2013 13.0  12.0 - 15.0 g/dL Final  . HCT 19/14/7829 37.2  36.0 - 46.0 % Final  . MCV 07/13/2013 85.7  78.0 - 100.0 fL Final  . MCH 07/13/2013 30.0  26.0 - 34.0 pg Final  . MCHC 07/13/2013 34.9  30.0 - 36.0 g/dL Final  . RDW 56/21/3086 14.0  11.5 - 15.5 % Final  . Platelets 07/13/2013 190  150 - 400 K/uL Final  . Neutrophils Relative % 07/13/2013 59  43 - 77 % Final  . Neutro Abs 07/13/2013 3.0  1.7 - 7.7 K/uL Final  . Lymphocytes Relative 07/13/2013 30  12 - 46 % Final  . Lymphs Abs 07/13/2013 1.5  0.7 - 4.0 K/uL Final  . Monocytes Relative 07/13/2013 8  3 - 12 % Final  . Monocytes Absolute 07/13/2013 0.4  0.1 - 1.0 K/uL Final  . Eosinophils Relative 07/13/2013 2  0 - 5 % Final  . Eosinophils Absolute 07/13/2013 0.1  0.0 - 0.7 K/uL Final  . Basophils Relative 07/13/2013 1  0 - 1 % Final  . Basophils Absolute 07/13/2013 0.0  0.0 - 0.1 K/uL Final  . Smear Review 07/13/2013 Criteria for review not met   Final  . TSH 07/13/2013 1.630  0.350 - 4.500 uIU/mL Final  . Cholesterol 07/13/2013 164  0 - 200 mg/dL Final   Comment: ATP III Classification:                                < 200        mg/dL        Desirable                               200 - 239     mg/dL        Borderline High                               >= 240        mg/dL        High                             . Triglycerides 07/13/2013 138  <150 mg/dL Final  . HDL 57/84/6962 41  >39 mg/dL Final  . Total CHOL/HDL Ratio 07/13/2013 4.0   Final  . VLDL 07/13/2013 28  0 - 40 mg/dL Final  . LDL Cholesterol 07/13/2013 95  0 - 99 mg/dL Final   Comment:                             Total Cholesterol/HDL Ratio:CHD Risk                                                 Coronary Heart Disease Risk Table  Men       Women                                   1/2 Average Risk              3.4        3.3                                       Average Risk              5.0        4.4                                    2X Average Risk              9.6        7.1                                    3X Average Risk             23.4       11.0                          Use the calculated Patient Ratio above and the CHD Risk table                           to determine the patient's CHD Risk.                          ATP III Classification (LDL):                                < 100        mg/dL         Optimal                               100 - 129     mg/dL         Near or Above Optimal                               130 - 159     mg/dL         Borderline High                               160 - 189     mg/dL         High                                > 190        mg/dL         Very High  Past Medical History  Diagnosis Date  . Hypertension   . Allergy   . Diverticulosis    Current Outpatient Prescriptions on File Prior to Visit  Medication Sig Dispense Refill  . lisinopril (PRINIVIL,ZESTRIL) 10 MG tablet TAKE ONE TABLET BY MOUTH ONE TIME DAILY   30 tablet  0   No current facility-administered medications on file prior to visit.   Allergies  Allergen Reactions  . Codeine     REACTION: nausea and vomiting  . Hydromorphone Hcl     REACTION: nausea and vomiting.  . Morphine     REACTION: nausea and vomiting   History   Social History  . Marital Status: Married    Spouse Name: N/A    Number of Children: N/A  . Years of Education: N/A   Occupational History  . Not on file.   Social History Main Topics  . Smoking status: Never Smoker   . Smokeless  tobacco: Never Used  . Alcohol Use: No     Comment: Occasional  . Drug Use: No  . Sexual Activity: Yes   Other Topics Concern  . Not on file   Social History Narrative  . No narrative on file   Family History  Problem Relation Age of Onset  . Hypertension Mother   . Heart disease Father   . Hypertension Paternal Grandfather   . Diabetes Paternal Grandfather        Review of Systems  All other systems reviewed and are negative.       Objective:   Physical Exam  Vitals reviewed. Constitutional: She is oriented to person, place, and time. She appears well-developed and well-nourished. No distress.  HENT:  Head: Normocephalic and atraumatic.  Right Ear: External ear normal.  Left Ear: External ear normal.  Nose: Nose normal.  Mouth/Throat: Oropharynx is clear and moist. No oropharyngeal exudate.  Eyes: Conjunctivae and EOM are normal. Pupils are equal, round, and reactive to light. Right eye exhibits no discharge. Left eye exhibits no discharge. No scleral icterus.  Neck: Normal range of motion. Neck supple. No JVD present. No tracheal deviation present. No thyromegaly present.  Cardiovascular: Normal rate, regular rhythm and intact distal pulses.  Exam reveals no gallop and no friction rub.   No murmur heard. Pulmonary/Chest: Effort normal and breath sounds normal. No stridor. No respiratory distress. She has no wheezes. She has no rales. She exhibits no tenderness.  Abdominal: Soft. Bowel sounds are normal. She exhibits no distension and no mass. There is no tenderness. There is no rebound and no guarding.  Musculoskeletal: Normal range of motion. She exhibits no edema.  Lymphadenopathy:    She has no cervical adenopathy.  Neurological: She is alert and oriented to person, place, and time. She has normal reflexes. She displays normal reflexes. No cranial nerve deficit. She exhibits normal muscle tone. Coordination normal.  Skin: Skin is warm. No rash noted. She is not  diaphoretic. No erythema. No pallor.  Psychiatric: She has a normal mood and affect. Her behavior is normal. Judgment and thought content normal.          Assessment & Plan:  1. Routine general medical examination at a health care facility Patient's physical exam is completely normal. Her tetanus shot and flu shot is up-to-date. She gets her mammogram, pelvic exam, and Pap smear through her gynecologist. Her lab work is completely normal. Her blood pressure is excellent. I recommended tincture of time upper respiratory infection. With regards to the diarrhea, I recommended  that she consumed a gluten-free diet and see if the diarrhea improves. If this does not help I recommended taking a probiotic daily for 2 weeks.  If this does not help I would recommend GI consult for possible colonoscopy to evaluate for inflammatory bowel

## 2013-07-17 ENCOUNTER — Other Ambulatory Visit: Payer: Self-pay | Admitting: Physician Assistant

## 2013-07-17 NOTE — Telephone Encounter (Signed)
Medication refilled per protocol. 

## 2013-10-01 ENCOUNTER — Encounter (HOSPITAL_BASED_OUTPATIENT_CLINIC_OR_DEPARTMENT_OTHER): Payer: Self-pay | Admitting: *Deleted

## 2013-10-01 ENCOUNTER — Other Ambulatory Visit: Payer: Self-pay | Admitting: Orthopedic Surgery

## 2013-10-02 ENCOUNTER — Encounter (HOSPITAL_BASED_OUTPATIENT_CLINIC_OR_DEPARTMENT_OTHER): Payer: Self-pay | Admitting: *Deleted

## 2013-10-02 NOTE — Progress Notes (Signed)
NPO AFTER MN WITH EXCEPTION CLEAR LIQUIDS UNTIL 0930. ARRIVE AT 2575. NEEDS ISTAT AND EKG.

## 2013-10-06 ENCOUNTER — Encounter (HOSPITAL_BASED_OUTPATIENT_CLINIC_OR_DEPARTMENT_OTHER): Payer: 59 | Admitting: Anesthesiology

## 2013-10-06 ENCOUNTER — Ambulatory Visit (HOSPITAL_BASED_OUTPATIENT_CLINIC_OR_DEPARTMENT_OTHER)
Admission: RE | Admit: 2013-10-06 | Discharge: 2013-10-06 | Disposition: A | Discharge status: Home / Self Care | Payer: 59 | Source: Ambulatory Visit | Attending: Specialist | Admitting: Specialist

## 2013-10-06 ENCOUNTER — Ambulatory Visit (HOSPITAL_BASED_OUTPATIENT_CLINIC_OR_DEPARTMENT_OTHER): Payer: 59 | Admitting: Anesthesiology

## 2013-10-06 ENCOUNTER — Encounter (HOSPITAL_BASED_OUTPATIENT_CLINIC_OR_DEPARTMENT_OTHER): Payer: Self-pay

## 2013-10-06 ENCOUNTER — Encounter (HOSPITAL_BASED_OUTPATIENT_CLINIC_OR_DEPARTMENT_OTHER)
Admission: RE | Disposition: A | Discharge status: Home / Self Care | Payer: Self-pay | Source: Ambulatory Visit | Attending: Specialist

## 2013-10-06 DIAGNOSIS — M19039 Primary osteoarthritis, unspecified wrist: Secondary | ICD-10-CM | POA: Insufficient documentation

## 2013-10-06 DIAGNOSIS — Z9889 Other specified postprocedural states: Secondary | ICD-10-CM

## 2013-10-06 DIAGNOSIS — K219 Gastro-esophageal reflux disease without esophagitis: Secondary | ICD-10-CM | POA: Insufficient documentation

## 2013-10-06 DIAGNOSIS — Z79899 Other long term (current) drug therapy: Secondary | ICD-10-CM | POA: Insufficient documentation

## 2013-10-06 DIAGNOSIS — I10 Essential (primary) hypertension: Secondary | ICD-10-CM | POA: Insufficient documentation

## 2013-10-06 DIAGNOSIS — M2240 Chondromalacia patellae, unspecified knee: Secondary | ICD-10-CM | POA: Insufficient documentation

## 2013-10-06 DIAGNOSIS — M1710 Unilateral primary osteoarthritis, unspecified knee: Secondary | ICD-10-CM | POA: Insufficient documentation

## 2013-10-06 DIAGNOSIS — M659 Unspecified synovitis and tenosynovitis, unspecified site: Secondary | ICD-10-CM | POA: Insufficient documentation

## 2013-10-06 DIAGNOSIS — Z885 Allergy status to narcotic agent status: Secondary | ICD-10-CM | POA: Insufficient documentation

## 2013-10-06 DIAGNOSIS — M1610 Unilateral primary osteoarthritis, unspecified hip: Secondary | ICD-10-CM | POA: Insufficient documentation

## 2013-10-06 DIAGNOSIS — E785 Hyperlipidemia, unspecified: Secondary | ICD-10-CM | POA: Insufficient documentation

## 2013-10-06 HISTORY — DX: Gastro-esophageal reflux disease without esophagitis: K21.9

## 2013-10-06 HISTORY — DX: Osteoarthritis of knee, unspecified: M17.9

## 2013-10-06 HISTORY — PX: KNEE ARTHROSCOPY WITH MEDIAL MENISECTOMY: SHX5651

## 2013-10-06 HISTORY — DX: Unilateral primary osteoarthritis, unspecified knee: M17.10

## 2013-10-06 HISTORY — DX: Personal history of other diseases of the digestive system: Z87.19

## 2013-10-06 HISTORY — DX: Unspecified osteoarthritis, unspecified site: M19.90

## 2013-10-06 LAB — POCT I-STAT 4, (NA,K, GLUC, HGB,HCT)
GLUCOSE: 93 mg/dL (ref 70–99)
HEMATOCRIT: 36 % (ref 36.0–46.0)
HEMOGLOBIN: 12.2 g/dL (ref 12.0–15.0)
POTASSIUM: 4 meq/L (ref 3.7–5.3)
Sodium: 139 mEq/L (ref 137–147)

## 2013-10-06 SURGERY — ARTHROSCOPY, KNEE, WITH MEDIAL MENISCECTOMY
Anesthesia: General | Site: Knee | Laterality: Right

## 2013-10-06 MED ORDER — LIDOCAINE HCL (CARDIAC) 20 MG/ML IV SOLN
INTRAVENOUS | Status: DC | PRN
  Administered 2013-10-06: 100 mg via INTRAVENOUS

## 2013-10-06 MED ORDER — CEPHALEXIN 500 MG PO CAPS: 500.0000 mg | ORAL_CAPSULE | Freq: Four times a day (QID) | ORAL | Status: DC

## 2013-10-06 MED ORDER — FENTANYL CITRATE 0.05 MG/ML IJ SOLN
25.0000 ug | INTRAMUSCULAR | Status: DC | PRN
Start: 2013-10-06 — End: 2013-10-06
  Filled 2013-10-06: qty 1

## 2013-10-06 MED ORDER — LACTATED RINGERS IV SOLN
INTRAVENOUS | Status: DC | PRN
  Administered 2013-10-06 (×2): via INTRAVENOUS

## 2013-10-06 MED ORDER — MORPHINE SULFATE 4 MG/ML IJ SOLN
INTRAMUSCULAR | Status: AC
  Filled 2013-10-06: qty 1

## 2013-10-06 MED ORDER — PROPOFOL 10 MG/ML IV BOLUS
INTRAVENOUS | Status: DC | PRN
  Administered 2013-10-06: 200 mg via INTRAVENOUS

## 2013-10-06 MED ORDER — CEFAZOLIN SODIUM-DEXTROSE 2-3 GM-% IV SOLR
2.0000 g | INTRAVENOUS | Status: DC
  Filled 2013-10-06: qty 50

## 2013-10-06 MED ORDER — OXYCODONE HCL 5 MG/5ML PO SOLN
5.0000 mg | Freq: Once | ORAL | Status: DC | PRN
  Filled 2013-10-06: qty 5

## 2013-10-06 MED ORDER — CHLORHEXIDINE GLUCONATE 4 % EX LIQD
60.0000 mL | Freq: Once | CUTANEOUS | Status: DC
  Filled 2013-10-06: qty 60

## 2013-10-06 MED ORDER — ONDANSETRON HCL 4 MG PO TABS: 4.0000 mg | ORAL_TABLET | Freq: Three times a day (TID) | ORAL | Status: DC | PRN

## 2013-10-06 MED ORDER — ASPIRIN EC 325 MG PO TBEC: 325.0000 mg | DELAYED_RELEASE_TABLET | Freq: Two times a day (BID) | ORAL | Status: DC

## 2013-10-06 MED ORDER — PROMETHAZINE HCL 25 MG/ML IJ SOLN
6.2500 mg | INTRAMUSCULAR | Status: DC | PRN
  Filled 2013-10-06: qty 1

## 2013-10-06 MED ORDER — OXYCODONE HCL 5 MG PO TABS
5.0000 mg | ORAL_TABLET | Freq: Once | ORAL | Status: DC | PRN
  Filled 2013-10-06: qty 1

## 2013-10-06 MED ORDER — MEPERIDINE HCL 25 MG/ML IJ SOLN
6.2500 mg | INTRAMUSCULAR | Status: DC | PRN
  Filled 2013-10-06: qty 1

## 2013-10-06 MED ORDER — SODIUM CHLORIDE 0.9 % IV SOLN
INTRAVENOUS | Status: DC
  Filled 2013-10-06: qty 1000

## 2013-10-06 MED ORDER — KETOROLAC TROMETHAMINE 30 MG/ML IJ SOLN
INTRAMUSCULAR | Status: DC | PRN
  Administered 2013-10-06: 30 mg via INTRAVENOUS

## 2013-10-06 MED ORDER — HYDROCODONE-ACETAMINOPHEN 5-325 MG PO TABS
ORAL_TABLET | ORAL | Status: AC
  Filled 2013-10-06: qty 1

## 2013-10-06 MED ORDER — HYDROCODONE-ACETAMINOPHEN 5-325 MG PO TABS
1.0000 | ORAL_TABLET | ORAL | Status: DC | PRN
  Administered 2013-10-06: 1 via ORAL
  Filled 2013-10-06: qty 1

## 2013-10-06 MED ORDER — BUPIVACAINE HCL 0.25 % IJ SOLN
INTRAMUSCULAR | Status: DC | PRN
  Administered 2013-10-06: 30 mL

## 2013-10-06 MED ORDER — FENTANYL CITRATE 0.05 MG/ML IJ SOLN
INTRAMUSCULAR | Status: DC | PRN
Start: 2013-10-06 — End: 2013-10-06
  Administered 2013-10-06 (×3): 25 ug via INTRAVENOUS
  Administered 2013-10-06: 50 ug via INTRAVENOUS
  Administered 2013-10-06 (×3): 25 ug via INTRAVENOUS

## 2013-10-06 MED ORDER — CEFAZOLIN SODIUM-DEXTROSE 2-3 GM-% IV SOLR
INTRAVENOUS | Status: DC | PRN
Start: 2013-10-06 — End: 2013-10-06
  Administered 2013-10-06: 2 g via INTRAVENOUS

## 2013-10-06 MED ORDER — MIDAZOLAM HCL 5 MG/5ML IJ SOLN
INTRAMUSCULAR | Status: DC | PRN
  Administered 2013-10-06: 2 mg via INTRAVENOUS

## 2013-10-06 MED ORDER — ONDANSETRON HCL 4 MG/2ML IJ SOLN
INTRAMUSCULAR | Status: DC | PRN
  Administered 2013-10-06: 4 mg via INTRAVENOUS

## 2013-10-06 MED ORDER — MIDAZOLAM HCL 2 MG/2ML IJ SOLN
INTRAMUSCULAR | Status: AC
  Filled 2013-10-06: qty 2

## 2013-10-06 MED ORDER — SODIUM CHLORIDE 0.9 % IR SOLN
Status: DC | PRN
  Administered 2013-10-06: 5000 mL

## 2013-10-06 MED ORDER — HYDROCODONE-ACETAMINOPHEN 5-325 MG PO TABS: 1.0000 | ORAL_TABLET | ORAL | Status: DC | PRN

## 2013-10-06 MED ORDER — LACTATED RINGERS IV SOLN
INTRAVENOUS | Status: DC
  Administered 2013-10-06: 14:00:00 via INTRAVENOUS
  Filled 2013-10-06: qty 1000

## 2013-10-06 MED ORDER — ACETAMINOPHEN 10 MG/ML IV SOLN
INTRAVENOUS | Status: DC | PRN
  Administered 2013-10-06: 1000 mg via INTRAVENOUS

## 2013-10-06 MED ORDER — FENTANYL CITRATE 0.05 MG/ML IJ SOLN
INTRAMUSCULAR | Status: AC
  Filled 2013-10-06: qty 4

## 2013-10-06 MED ORDER — STERILE WATER FOR IRRIGATION IR SOLN
Status: DC | PRN
  Administered 2013-10-06: 1

## 2013-10-06 MED ORDER — DEXAMETHASONE SODIUM PHOSPHATE 4 MG/ML IJ SOLN
INTRAMUSCULAR | Status: DC | PRN
  Administered 2013-10-06: 10 mg via INTRAVENOUS

## 2013-10-06 SURGICAL SUPPLY — 54 items
BANDAGE ESMARK 6X9 LF (GAUZE/BANDAGES/DRESSINGS) ×1 IMPLANT
BANDAGE GAUZE ELAST BULKY 4 IN (GAUZE/BANDAGES/DRESSINGS) ×4 IMPLANT
BLADE 4.2CUDA (BLADE) IMPLANT
BLADE CUDA GRT WHITE 3.5 (BLADE) ×3 IMPLANT
BLADE CUDA SHAVER 3.5 (BLADE) IMPLANT
BNDG CMPR 9X6 STRL LF SNTH (GAUZE/BANDAGES/DRESSINGS) ×1
BNDG ESMARK 6X9 LF (GAUZE/BANDAGES/DRESSINGS) ×3
BNDG GAUZE ELAST 4 BULKY (GAUZE/BANDAGES/DRESSINGS) ×6 IMPLANT
CANISTER SUCT LVC 12 LTR MEDI- (MISCELLANEOUS) ×6 IMPLANT
CANISTER SUCTION 1200CC (MISCELLANEOUS) ×3 IMPLANT
CLOTH BEACON ORANGE TIMEOUT ST (SAFETY) ×3 IMPLANT
CUFF TOURNIQUET SINGLE 34IN LL (TOURNIQUET CUFF) ×3 IMPLANT
DRAPE ARTHROSCOPY W/POUCH 114 (DRAPES) ×3 IMPLANT
DRAPE INCISE 23X17 IOBAN STRL (DRAPES) ×2
DRAPE INCISE 23X17 STRL (DRAPES) ×1 IMPLANT
DRAPE INCISE IOBAN 23X17 STRL (DRAPES) ×1 IMPLANT
DRAPE INCISE IOBAN 66X45 STRL (DRAPES) ×3 IMPLANT
DRSG PAD ABDOMINAL 8X10 ST (GAUZE/BANDAGES/DRESSINGS) ×2 IMPLANT
DURAPREP 26ML APPLICATOR (WOUND CARE) ×3 IMPLANT
ELECT MENISCUS 165MM 90D (ELECTRODE) IMPLANT
ELECT REM PT RETURN 9FT ADLT (ELECTROSURGICAL)
ELECTRODE REM PT RTRN 9FT ADLT (ELECTROSURGICAL) IMPLANT
GAUZE XEROFORM 1X8 LF (GAUZE/BANDAGES/DRESSINGS) ×3 IMPLANT
GLOVE BIO SURGEON STRL SZ7.5 (GLOVE) ×3 IMPLANT
GLOVE BIOGEL M STER SZ 6 (GLOVE) ×2 IMPLANT
GLOVE INDICATOR 6.5 STRL GRN (GLOVE) ×2 IMPLANT
GLOVE INDICATOR 7.0 STRL GRN (GLOVE) ×2 IMPLANT
GLOVE INDICATOR 8.0 STRL GRN (GLOVE) ×6 IMPLANT
GLOVE SURG ORTHO 8.0 STRL STRW (GLOVE) ×3 IMPLANT
GOWN PREVENTION PLUS LG XLONG (DISPOSABLE) ×1 IMPLANT
GOWN STRL REIN XL XLG (GOWN DISPOSABLE) ×1 IMPLANT
GOWN STRL REUS W/TWL LRG LVL3 (GOWN DISPOSABLE) ×4 IMPLANT
GOWN STRL REUS W/TWL XL LVL3 (GOWN DISPOSABLE) ×2 IMPLANT
IMMOBILIZER KNEE 22 UNIV (SOFTGOODS) IMPLANT
IMMOBILIZER KNEE 24 THIGH 36 (MISCELLANEOUS) IMPLANT
IMMOBILIZER KNEE 24 UNIV (MISCELLANEOUS)
KNEE WRAP E Z 3 GEL PACK (MISCELLANEOUS) ×3 IMPLANT
MINI VAC (SURGICAL WAND) ×3 IMPLANT
NEEDLE HYPO 22GX1.5 SAFETY (NEEDLE) ×3 IMPLANT
PACK ARTHROSCOPY DSU (CUSTOM PROCEDURE TRAY) ×3 IMPLANT
PACK BASIN DAY SURGERY FS (CUSTOM PROCEDURE TRAY) ×3 IMPLANT
PAD ABD 8X10 STRL (GAUZE/BANDAGES/DRESSINGS) ×6 IMPLANT
PADDING CAST ABS 4INX4YD NS (CAST SUPPLIES) ×2
PADDING CAST ABS COTTON 4X4 ST (CAST SUPPLIES) ×1 IMPLANT
PENCIL BUTTON HOLSTER BLD 10FT (ELECTRODE) IMPLANT
SET ARTHROSCOPY TUBING (MISCELLANEOUS) ×3
SET ARTHROSCOPY TUBING LN (MISCELLANEOUS) ×1 IMPLANT
SPONGE GAUZE 4X4 12PLY (GAUZE/BANDAGES/DRESSINGS) ×3 IMPLANT
SUT ETHILON 4 0 PS 2 18 (SUTURE) ×3 IMPLANT
SYR CONTROL 10ML LL (SYRINGE) ×3 IMPLANT
TOWEL OR 17X24 6PK STRL BLUE (TOWEL DISPOSABLE) ×3 IMPLANT
WAND 30 DEG SABER W/CORD (SURGICAL WAND) IMPLANT
WAND 90 DEG TURBOVAC W/CORD (SURGICAL WAND) IMPLANT
WATER STERILE IRR 500ML POUR (IV SOLUTION) ×3 IMPLANT

## 2013-10-06 NOTE — Anesthesia Procedure Notes (Signed)
Procedure Name: LMA Insertion Date/Time: 10/06/2013 5:39 PM Performed by: Justice Rocher Pre-anesthesia Checklist: Patient identified, Emergency Drugs available, Suction available and Patient being monitored Patient Re-evaluated:Patient Re-evaluated prior to inductionOxygen Delivery Method: Circle System Utilized Preoxygenation: Pre-oxygenation with 100% oxygen Intubation Type: IV induction Ventilation: Mask ventilation without difficulty LMA: LMA inserted LMA Size: 4.0 Number of attempts: 1 Airway Equipment and Method: bite block Placement Confirmation: positive ETCO2 Tube secured with: Tape Dental Injury: Teeth and Oropharynx as per pre-operative assessment

## 2013-10-06 NOTE — Transfer of Care (Signed)
Immediate Anesthesia Transfer of Care Note  Patient: Jill Cook  Procedure(s) Performed: Procedure(s) (LRB): RIGHT KNEE ARTHROSCOPY WITH CHONDROPLASTY (Right)  Patient Location: PACU  Anesthesia Type: General  Level of Consciousness: awake, sedated, patient cooperative and responds to stimulation  Airway & Oxygen Therapy: Patient Spontanous Breathing and Patient connected to face mask oxygen  Post-op Assessment: Report given to PACU RN, Post -op Vital signs reviewed and stable and Patient moving all extremities  Post vital signs: Reviewed and stable  Complications: No apparent anesthesia complications

## 2013-10-06 NOTE — Interval H&P Note (Signed)
History and Physical Interval Note:  10/06/2013 2:57 PM  Jill Cook  has presented today for surgery, with the diagnosis of right knee torn mensicus and chondromalcia  The various methods of treatment have been discussed with the patient and family. After consideration of risks, benefits and other options for treatment, the patient has consented to  Procedure(s): RIGHT KNEE ARTHROSCOPY WITH PARTIAL MENISECTOMY WITH CHONDROPLASTY (Right) as a surgical intervention .  The patient's history has been reviewed, patient examined, no change in status, stable for surgery.  I have reviewed the patient's chart and labs.  Questions were answered to the patient's satisfaction.     Nijah Orlich ANDREW

## 2013-10-06 NOTE — Discharge Instructions (Signed)

## 2013-10-06 NOTE — Anesthesia Preprocedure Evaluation (Addendum)
Anesthesia Evaluation  Patient identified by MRN, date of birth, ID band Patient awake    Reviewed: Allergy & Precautions, H&P , NPO status , Patient's Chart, lab work & pertinent test results  Airway       Dental   Pulmonary neg pulmonary ROS,          Cardiovascular hypertension, Pt. on medications     Neuro/Psych negative neurological ROS  negative psych ROS   GI/Hepatic negative GI ROS, Neg liver ROS, GERD-  ,  Endo/Other  negative endocrine ROS  Renal/GU negative Renal ROS     Musculoskeletal negative musculoskeletal ROS (+)   Abdominal   Peds  Hematology  (+) Blood dyscrasia, ,   Anesthesia Other Findings   Reproductive/Obstetrics negative OB ROS                          Anesthesia Physical Anesthesia Plan  ASA: II  Anesthesia Plan: General   Post-op Pain Management:    Induction: Intravenous  Airway Management Planned: LMA  Additional Equipment:   Intra-op Plan:   Post-operative Plan: Extubation in OR  Informed Consent: I have reviewed the patients History and Physical, chart, labs and discussed the procedure including the risks, benefits and alternatives for the proposed anesthesia with the patient or authorized representative who has indicated his/her understanding and acceptance.   Dental advisory given  Plan Discussed with: CRNA  Anesthesia Plan Comments:         Anesthesia Quick Evaluation

## 2013-10-06 NOTE — Op Note (Signed)
Dictated [phone]

## 2013-10-06 NOTE — H&P (View-Only) (Signed)
NPO AFTER MN WITH EXCEPTION CLEAR LIQUIDS UNTIL 0930. ARRIVE AT 1345. NEEDS ISTAT AND EKG.  

## 2013-10-06 NOTE — Anesthesia Postprocedure Evaluation (Signed)
  Anesthesia Post-op Note  Patient: Jill Cook  Procedure(s) Performed: Procedure(s) (LRB): RIGHT KNEE ARTHROSCOPY WITH CHONDROPLASTY (Right)  Patient Location: PACU  Anesthesia Type: General  Level of Consciousness: awake and alert   Airway and Oxygen Therapy: Patient Spontanous Breathing  Post-op Pain: mild  Post-op Assessment: Post-op Vital signs reviewed, Patient's Cardiovascular Status Stable, Respiratory Function Stable, Patent Airway and No signs of Nausea or vomiting  Last Vitals:  Filed Vitals:   10/06/13 1828  BP: 152/78  Pulse: 111  Temp: 37.1 C  Resp: 15    Post-op Vital Signs: stable   Complications: No apparent anesthesia complications

## 2013-10-06 NOTE — H&P (Signed)
Jill Cook is an 48 y.o. female.   Chief Complaint: right knee pain HPI: Patient presents with right knee pain. Despite conservative treatment her discomfort continues. She wishes to proceed with surgery as consented. Denies SOB, CP, or fever/chills.  Past Medical History  Diagnosis Date  . Hypertension   . Diverticulosis   . Hyperlipidemia   . History of esophagitis   . Right knee meniscal tear   . OA (osteoarthritis) of knee     RIGHT  . GERD (gastroesophageal reflux disease)     WATCHES DIET  . Arthritis     RIGHT HIP AND WRIST    Past Surgical History  Procedure Laterality Date  . Cesarean section  1994  . Anal fissure repair  06-13-2000  . D & c hysteroscopy /  novasure endometrial ablation  11-15-2005  . Knee arthroscopy w/ meniscectomy Right   . Right knee patellofemoral arthroplasty  06-10-2008  . Bunionectomy Right 1991  . Septoplasty  2000  . Transthoracic echocardiogram  06-03-2008    NORMAL /  EF 60%  . Tubal ligation  1996  . Tonsillectomy  AGE 36    Family History  Problem Relation Age of Onset  . Hypertension Mother   . Heart disease Father   . Hypertension Paternal Grandfather   . Diabetes Paternal Grandfather    Social History:  reports that she has never smoked. She has never used smokeless tobacco. She reports that she drinks alcohol. She reports that she does not use illicit drugs.  Allergies:  Allergies  Allergen Reactions  . Codeine Nausea And Vomiting  . Hydromorphone Hcl Nausea And Vomiting  . Morphine Nausea And Vomiting    Medications Prior to Admission  Medication Sig Dispense Refill  . lisinopril (PRINIVIL,ZESTRIL) 10 MG tablet TAKE ONE TABLET BY MOUTH ONE TIME DAILY---  TAKES IN AM      . Omega-3 Fatty Acids (FISH OIL) 1200 MG CAPS Take 1 capsule by mouth daily.        Results for orders placed during the hospital encounter of 10/06/13 (from the past 48 hour(s))  POCT I-STAT 4, (NA,K, GLUC, HGB,HCT)     Status: None   Collection Time    10/06/13  2:22 PM      Result Value Ref Range   Sodium 139  137 - 147 mEq/L   Potassium 4.0  3.7 - 5.3 mEq/L   Glucose, Bld 93  70 - 99 mg/dL   HCT 36.0  36.0 - 46.0 %   Hemoglobin 12.2  12.0 - 15.0 g/dL   No results found.  Review of Systems  Constitutional: Negative.   HENT: Negative.   Eyes: Negative.   Respiratory: Negative.   Cardiovascular: Negative.   Gastrointestinal: Negative.   Genitourinary: Negative.   Musculoskeletal: Negative.   Skin: Negative.   Neurological: Negative.   Endo/Heme/Allergies: Negative.   Psychiatric/Behavioral: Negative.     Blood pressure 119/77, pulse 95, temperature 98.3 F (36.8 C), temperature source Oral, resp. rate 16, height 5\' 6"  (1.676 m), weight 97.297 kg (214 lb 8 oz), SpO2 99.00%. Physical Exam  Constitutional: She appears well-developed.  HENT:  Head: Normocephalic.  Eyes: EOM are normal.  Neck: Normal range of motion.  Cardiovascular: Normal rate, regular rhythm, normal heart sounds and intact distal pulses.   Respiratory: Effort normal and breath sounds normal.  GI: Soft.  Musculoskeletal: Normal range of motion. She exhibits edema and tenderness.  Neurological: She is alert.  Psychiatric: Her behavior is normal.  Assessment/Plan: Right knee TM and chondral defect: D/c home today Follow up in office Myndi Wamble L 10/06/2013, 2:47 PM

## 2013-10-07 ENCOUNTER — Encounter (HOSPITAL_BASED_OUTPATIENT_CLINIC_OR_DEPARTMENT_OTHER): Payer: Self-pay | Admitting: Specialist

## 2013-10-08 NOTE — Op Note (Signed)
NAMEJUANITA, STREIGHT             ACCOUNT NO.:  [id number]  MEDICAL RECORD NO.:  [id number]  LOCATION:                                 FACILITY:  PHYSICIAN:  Cynda Familia, M.D.DATE OF BIRTH:  1965/10/26  DATE OF PROCEDURE:  10/06/2013 DATE OF DISCHARGE:                              OPERATIVE REPORT   PREOPERATIVE DIAGNOSES:  Right knee osteoarthritis, possible torn lateral meniscus.  POSTOPERATIVE DIAGNOSIS:  Right knee osteoarthritis.  PROCEDURE:  Right knee arthroscopic chondroplasty of the medial and lateral compartments and synovectomy.  SURGEON:  Cynda Familia, M.D.  ASSISTANT:  Wyatt Portela, PA-C  ANESTHESIA:  General.  ESTIMATED BLOOD LOSS:  Minimal.  DRAINS:  None.  COMPLICATION:  None.  TOURNIQUET TIME:  25 minutes at 300 mmHg.  DISPOSITION:  PACU stable.  OPERATIVE DETAILS:  The patient was counseled in the holding area. Correct site was identified.  IV was started, sedation was given.  IV Ancef was given on the way to the operating room.  In the OR, placed in supine position under general anesthesia.  Right thigh was placed in the thigh holder, prepped with DuraPrep and draped in sterile fashion.  Time- out was done to confirmed __________.  Arthroscopic portals were meticulously established.  Popliteal, inferomedial, inferolateral __________ actually had previous patellofemoral arthroplasty.  There was quite a bit of synovitis anteromedial and anterolateral, debridement with shaver.  ACL and PCL were intact.  The patellofemoral joint was inspected.  It was found to be without complicating features, it was nice and stable and tracking well.  Showed no significant wear. Suprapatellar pouch, medial and lateral gutters were unremarkable. Medial compartment inspected, medial meniscus intact.  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  __________femoral condyle back to stable base with shaver.  Lateral meniscus is actually found to be intact.  __________soft.  Following the synovectomy, chondroplasty of the medial and lateral compartment __________ the knee was reinspected, irrigated, and arthroscopic equipment removed. __________suture.  Another 10 mL of Sensorcaine placed in skin, 10 mL of Sensorcaine into the knee joint.  Sterile dressing was applied, TED hose, ice pack.  No complications.  She was awakened, taken from operating room to PACU in stable condition.  She was stabilized in the PACU and discharged to home.          ______________________________ Cynda Familia, M.D.     RAC/MEDQ  D:  10/06/2013  T:  10/07/2013  Job:  [id number]

## 2013-10-20 ENCOUNTER — Encounter: Payer: Self-pay | Admitting: Specialist

## 2013-10-28 ENCOUNTER — Encounter: Payer: Self-pay | Admitting: Specialist

## 2014-01-04 ENCOUNTER — Other Ambulatory Visit: Payer: Self-pay | Admitting: Family Medicine

## 2014-08-04 ENCOUNTER — Other Ambulatory Visit: Payer: Self-pay | Admitting: Family Medicine

## 2014-08-04 NOTE — Telephone Encounter (Signed)
Refill appropriate and filled per protocol. 

## 2014-09-08 ENCOUNTER — Other Ambulatory Visit: Payer: Self-pay | Admitting: Obstetrics and Gynecology

## 2014-09-10 LAB — CYTOLOGY - PAP

## 2014-12-02 ENCOUNTER — Encounter: Payer: Self-pay | Admitting: Internal Medicine

## 2015-01-04 ENCOUNTER — Telehealth: Payer: Self-pay | Admitting: Internal Medicine

## 2015-01-05 NOTE — Telephone Encounter (Signed)
Left a message for patient to call back. 

## 2015-01-05 NOTE — Telephone Encounter (Signed)
Spoke with patient and she is having LLQ pain that radiates to her rectum x 2 weeks. She also reports upper abdominal/chest pain yesterday but none today. Patient instructed to seek ED care for chest pain if this recurs. Scheduled with Lori Hvozdovic, PA-C on 01/19/15 at 1:45 PM for LLQ pain. She understand to see ED or urgent care if pain increases or for increasing symptoms.

## 2015-01-19 ENCOUNTER — Other Ambulatory Visit: Payer: Self-pay | Admitting: *Deleted

## 2015-01-19 ENCOUNTER — Ambulatory Visit (INDEPENDENT_AMBULATORY_CARE_PROVIDER_SITE_OTHER): Payer: BLUE CROSS/BLUE SHIELD | Admitting: Physician Assistant

## 2015-01-19 ENCOUNTER — Other Ambulatory Visit (INDEPENDENT_AMBULATORY_CARE_PROVIDER_SITE_OTHER): Payer: BLUE CROSS/BLUE SHIELD

## 2015-01-19 ENCOUNTER — Encounter: Payer: Self-pay | Admitting: Physician Assistant

## 2015-01-19 ENCOUNTER — Telehealth: Payer: Self-pay | Admitting: *Deleted

## 2015-01-19 VITALS — BP 120/70 | HR 92 | Ht 66.0 in | Wt 217.1 lb

## 2015-01-19 DIAGNOSIS — K602 Anal fissure, unspecified: Secondary | ICD-10-CM | POA: Diagnosis not present

## 2015-01-19 DIAGNOSIS — R1031 Right lower quadrant pain: Secondary | ICD-10-CM

## 2015-01-19 DIAGNOSIS — R1013 Epigastric pain: Secondary | ICD-10-CM

## 2015-01-19 DIAGNOSIS — K219 Gastro-esophageal reflux disease without esophagitis: Secondary | ICD-10-CM | POA: Diagnosis not present

## 2015-01-19 DIAGNOSIS — R748 Abnormal levels of other serum enzymes: Secondary | ICD-10-CM

## 2015-01-19 LAB — CBC WITH DIFFERENTIAL/PLATELET
BASOS ABS: 0.1 10*3/uL (ref 0.0–0.1)
Basophils Relative: 1 % (ref 0.0–3.0)
EOS PCT: 1.8 % (ref 0.0–5.0)
Eosinophils Absolute: 0.1 10*3/uL (ref 0.0–0.7)
HCT: 36.5 % (ref 36.0–46.0)
Hemoglobin: 12.6 g/dL (ref 12.0–15.0)
Lymphocytes Relative: 29.8 % (ref 12.0–46.0)
Lymphs Abs: 1.9 10*3/uL (ref 0.7–4.0)
MCHC: 34.5 g/dL (ref 30.0–36.0)
MCV: 87 fl (ref 78.0–100.0)
MONOS PCT: 5.7 % (ref 3.0–12.0)
Monocytes Absolute: 0.4 10*3/uL (ref 0.1–1.0)
NEUTROS ABS: 3.9 10*3/uL (ref 1.4–7.7)
NEUTROS PCT: 61.7 % (ref 43.0–77.0)
Platelets: 199 10*3/uL (ref 150.0–400.0)
RBC: 4.2 Mil/uL (ref 3.87–5.11)
RDW: 13.9 % (ref 11.5–15.5)
WBC: 6.3 10*3/uL (ref 4.0–10.5)

## 2015-01-19 LAB — COMPREHENSIVE METABOLIC PANEL
ALBUMIN: 4.3 g/dL (ref 3.5–5.2)
ALK PHOS: 52 U/L (ref 39–117)
ALT: 13 U/L (ref 0–35)
AST: 15 U/L (ref 0–37)
BUN: 13 mg/dL (ref 6–23)
CALCIUM: 9.4 mg/dL (ref 8.4–10.5)
CO2: 27 mEq/L (ref 19–32)
Chloride: 104 mEq/L (ref 96–112)
Creatinine, Ser: 1.01 mg/dL (ref 0.40–1.20)
GFR: 62 mL/min (ref >60.00)
Glucose, Bld: 109 mg/dL — ABNORMAL HIGH (ref 70–99)
POTASSIUM: 3.9 meq/L (ref 3.5–5.1)
SODIUM: 138 meq/L (ref 135–145)
TOTAL PROTEIN: 6.9 g/dL (ref 6.0–8.3)
Total Bilirubin: 0.4 mg/dL (ref 0.2–1.2)

## 2015-01-19 LAB — TSH: TSH: 1.55 u[IU]/mL (ref 0.35–4.50)

## 2015-01-19 LAB — AMYLASE: Amylase: 34 U/L (ref 27–131)

## 2015-01-19 LAB — LIPASE: Lipase: 60 U/L — ABNORMAL HIGH (ref 11.0–59.0)

## 2015-01-19 LAB — IGA: IgA: 70 mg/dL (ref 68–378)

## 2015-01-19 MED ORDER — PANTOPRAZOLE SODIUM 40 MG PO TBEC: DELAYED_RELEASE_TABLET | ORAL | Status: DC

## 2015-01-19 MED ORDER — DILTIAZEM GEL 2 %: 1.0000 "application " | Freq: Three times a day (TID) | CUTANEOUS | Status: DC

## 2015-01-19 MED ORDER — HYOSCYAMINE SULFATE 0.125 MG SL SUBL: SUBLINGUAL_TABLET | SUBLINGUAL | Status: DC

## 2015-01-19 NOTE — Telephone Encounter (Signed)
Called prescription into [facility]. Diltizem Gel 2 %. Directions : patient is to apply rectally 3 times daily for 6 weeks.  3 Gram tube with 1 refill.  I read the [employer] ID number to the pharmacist. Does not look like the patient has prescription coverage.  No RX Bin or RX Group or Id numbers.

## 2015-01-19 NOTE — Progress Notes (Signed)
Patient ID: Jill Cook, female   DOB: 11/09/1965, 49 y.o.   MRN: [id number]    HPI:  Jill Cook is a 49 y.o.   female referred by Susy Frizzle, MD for evaluation of right lower abdominal pain and GERD.  Patient is known to Dr. Olevia Perches with a history of GERD, duodenitis, esophagitis, and dyspepsia. She has a history of anal fissures as well. Her last colonoscopy was on 10/30/2005 and it was a normal examination with no polyps. Her last endoscopy was 10/25/2008 and was done for noncardiac chest pain. She was found to have mild gastritis but an otherwise normal exam. She per day presents today stating that she typically has had diarrhea for most of her adult life. Sometimes it is urgent. About 3 weeks ago she started to have severe right lower quadrant pain that radiates to the rectum. It is exacerbated with sitting for a long period. It is not exacerbated nor alleviated with ingestion of food. It comes and goes and can last from seconds to 15-20 minutes. Lying down in her stomach helps. It is not associated with nausea or vomiting. She has a constant urge that she has to move her bowels but nothing comes out. She has had 2 hard stools recently that she strained to pass and then has had diarrhea in between. She had blood each time with those to firm stools. She has no urinary symptoms but says if her bladder is full it increases her right lower quadrant pain. Her pain is also worse if she doesn't move her bowels. She has no associated I pain, joint pain, oral ulcers or rashes. She had a uterine ablation 9-10 years ago but still has her ovaries. She still sees her gynecologist recently and [facility]. She says her exam was normal and she was not having the pain she describes now at that time.  She also has a long-standing history of GERD and has been on Prevacid. She reports that after she eats she has a lot of phlegm and has to cough it up. She has no  nocturnal regurgitation or cough. She has no dysphagia but complains of epigastric pain that is fairly constant and is not alleviated or exacerbated with ingestion of foods. Recently she feels full after 2 bites and can only eat very small frequent meals. Her weight has been stable. She denies excessive use of non-steroidal anti-inflammatory drugs.   Past Medical History  Diagnosis Date  . Hypertension   . Diverticulosis   . Hyperlipidemia   . History of esophagitis   . Right knee meniscal tear   . OA (osteoarthritis) of knee     RIGHT  . GERD (gastroesophageal reflux disease)     WATCHES DIET  . Arthritis     RIGHT HIP AND WRIST  . Anal fissure   . Anemia     Past Surgical History  Procedure Laterality Date  . Cesarean section  1994  . Anal fissure repair  06-13-2000  . D & c hysteroscopy /  novasure endometrial ablation  11-15-2005  . Knee arthroscopy w/ meniscectomy Right   . Right knee patellofemoral arthroplasty  06-10-2008  . Bunionectomy Right 1991  . Septoplasty  2000  . Transthoracic echocardiogram  06-03-2008    NORMAL /  EF 60%  . Tubal ligation  1996  . Tonsillectomy  AGE 69  . Knee arthroscopy with medial menisectomy Right 10/06/2013    Procedure: RIGHT  KNEE ARTHROSCOPY WITH CHONDROPLASTY;  Surgeon: Sydnee Cabal, MD;  Location: [facility];  Service: Orthopedics;  Laterality: Right;   Family History  Problem Relation Age of Onset  . Hypertension Mother   . Heart disease Father   . Hypertension Paternal Grandfather   . Diabetes Paternal Grandfather   . Thyroid disease Mother   . Uterine cancer Paternal Aunt   . Colon cancer Maternal Aunt   . Esophageal cancer Maternal Grandfather    History  Substance Use Topics  . Smoking status: Never Smoker   . Smokeless tobacco: Never Used  . Alcohol Use: Yes     Comment: Occasional   Current Outpatient Prescriptions  Medication Sig Dispense Refill  . Calcium Carb-Cholecalciferol (CALCIUM-VITAMIN  D) 600-400 MG-UNIT TABS Take 1 tablet by mouth daily.    Marland Kitchen GLUCOSAMINE-CHONDROITIN PO Take 1 capsule by mouth daily.    Marland Kitchen lisinopril (PRINIVIL,ZESTRIL) 10 MG tablet TAKE ONE TABLET BY MOUTH ONE TIME DAILY  30 tablet 4  . diltiazem 2 % GEL Apply 1 application topically 3 (three) times daily. 30 g 1  . hyoscyamine (LEVSIN/SL) 0.125 MG SL tablet Take 1 tablet 3 times daily as need3ed for cramping and diarrhea, spasms. 909 tablet 3  . pantoprazole (PROTONIX) 40 MG tablet Take 1 tab prior to breakfast. 30 tablet 4   No current facility-administered medications for this visit.   Allergies  Allergen Reactions  . Codeine Nausea And Vomiting  . Dilaudid [Hydromorphone Hcl]   . Hydromorphone Hcl Nausea And Vomiting  . Morphine Nausea And Vomiting     Review of Systems: Gen: Denies any fever, chills, sweats, anorexia, fatigue, weakness, malaise, weight loss, and sleep disorder CV: Denies chest pain, angina, palpitations, syncope, orthopnea, PND, peripheral edema, and claudication. Resp: Denies dyspnea at rest, dyspnea with exercise, cough, sputum, wheezing, coughing up blood, and pleurisy. GI: Denies vomiting blood, jaundice, and fecal incontinence.   Denies dysphagia or odynophagia. GU : Denies urinary burning, blood in urine, urinary frequency, urinary hesitancy, nocturnal urination, and urinary incontinence. MS: Denies joint pain, limitation of movement, and swelling, stiffness, low back pain, extremity pain. Denies muscle weakness, cramps, atrophy.  Derm: Denies rash, itching, dry skin, hives, moles, warts, or unhealing ulcers.  Psych: Denies depression, anxiety, memory loss, suicidal ideation, hallucinations, paranoia, and confusion. Heme: Denies bruising, bleeding, and enlarged lymph nodes. Neuro:  Denies any headaches, dizziness, paresthesias. Endo:  Denies any problems with DM, thyroid, adrenal function  Prior Endoscopies:   See history of present illness  Physical Exam: BP 120/70 mmHg   Pulse 92  Ht 5\' 6"  (1.676 m)  Wt 217 lb 2 oz (98.487 kg)  BMI 35.06 kg/m2 Constitutional: Pleasant,well-developed, white female in no acute distress. HEENT: Normocephalic and atraumatic. Conjunctivae are normal. No scleral icterus. Neck supple. Thyromegaly Cardiovascular: Normal rate, regular rhythm.  Pulmonary/chest: Effort normal and breath sounds normal. No wheezing, rales or rhonchi. Abdominal: Soft, nondistended, right lower quadrant and suprapubic tenderness to palpation with guarding but no rebound, no costovertebral angle tenderness Bowel sounds active throughout. There are no masses palpable. No hepatomegaly. Rectal: Brown stool heme negative, anal fissure noted Extremities: no edema Lymphadenopathy: No cervical adenopathy noted. Neurological: Alert and oriented to person place and time. Skin: Skin is warm and dry. No rashes noted. Psychiatric: Normal mood and affect. Behavior is normal.  ASSESSMENT AND PLAN: #1. Right lower quadrant pain. In has been present for 3 weeks and waxes and wanes in intensity. It is exacerbated if she has not  urinated or not moved her bowels. She does still have her ovaries. Will obtain CT of the abdomen and pelvis to help delineate an etiology for her pain. In the meantime she will use Benefiber 2 tablespoons every morning and water. She will also be given a trial of Levsin 0.25 mg 1 by mouth 3 times a day when necessary.  #2. Anal fissure. She will be given a trial of diltiazem ointment 0.2% to apply rectally 3 times a day for 6 weeks.  #3. GERD and epigastric pain. An antireflux regimen has been reviewed. She will discontinue Prevacid and be given a trial of pantoprazole 40 mg 1 by mouth every morning 30 minutes prior to breakfast. A CBC, apprehensive metabolic panel, amylase, lipase, TSH, IgA and TTG will be obtained.  Further recommendations and follow-up will be made pending the findings of the above.    Orest Dygert, Deloris Ping 01/19/2015, 4:27  PM  CC: Susy Frizzle, MD

## 2015-01-19 NOTE — Patient Instructions (Addendum)
Please go to the basement level to have your labs drawn.  We sent prescriptions to [facility] [place], [place]. 1. Pantoprazole sodium 40 mg  2. Levsin SL  We sent a prescription to [facility] . 1. Diltiazem gel 2 %   Take 2 TB of Benefiber in water every day.    You have been scheduled for a CT scan of the abdomen and pelvis at [place], [place], [place] ( [facility]).   You are scheduled on 01-24-2015 at 10:30 am . You should arrive at 10:15 am  prior to your appointment time for registration. Please follow the written instructions below on the day of your exam:  WARNING: IF YOU ARE ALLERGIC TO IODINE/X-RAY DYE, PLEASE NOTIFY RADIOLOGY IMMEDIATELY AT [phone]! YOU WILL BE GIVEN A 13 HOUR PREMEDICATION PREP.  1) Do not eat or drink anything after  6:30 am (4 hours prior to your test) 2) You have been given 2 bottles of oral contrast to drink. The solution may taste better if refrigerated, but do NOT add ice or any other liquid to this solution. Shake well before drinking.    Call [phone] and get the instructions for drinking the oral contrast we have given y ou.    The purpose of you drinking the oral contrast is to aid in the visualization of your intestinal tract. The contrast solution may cause some diarrhea. Before your exam is started, you will be given a small amount of fluid to drink. Depending on your individual set of symptoms, you may also receive an intravenous injection of x-ray contrast/dye. Plan on being at [facility] for 30 minutes or long, depending on the type of exam you are having performed.  If you have any questions regarding your exam or if you need to reschedule, you may call the CT department at [phone] between the hours of 8:00 am and 5:00 pm, Monday-Friday.  ________________________________________________________________________

## 2015-01-19 NOTE — Progress Notes (Signed)
Reviewed, and agree with CT scan of abd/pelvis, r/o pelvic mass, exerting extrinsic pressure , also  Constipation with overflow diarrhea. Will see. KUB might have been helpful while in the office .

## 2015-01-20 LAB — TISSUE TRANSGLUTAMINASE, IGG: Tissue Transglut Ab: 1 U/mL (ref <6)

## 2015-01-21 ENCOUNTER — Other Ambulatory Visit: Payer: Self-pay | Admitting: Family Medicine

## 2015-01-24 ENCOUNTER — Other Ambulatory Visit (INDEPENDENT_AMBULATORY_CARE_PROVIDER_SITE_OTHER): Payer: BLUE CROSS/BLUE SHIELD

## 2015-01-24 ENCOUNTER — Ambulatory Visit
Admission: RE | Admit: 2015-01-24 | Discharge: 2015-01-24 | Disposition: A | Discharge status: Home / Self Care | Payer: BLUE CROSS/BLUE SHIELD | Source: Ambulatory Visit | Attending: Physician Assistant | Admitting: Physician Assistant

## 2015-01-24 DIAGNOSIS — R748 Abnormal levels of other serum enzymes: Secondary | ICD-10-CM

## 2015-01-24 DIAGNOSIS — R1013 Epigastric pain: Secondary | ICD-10-CM

## 2015-01-24 DIAGNOSIS — R1031 Right lower quadrant pain: Secondary | ICD-10-CM | POA: Diagnosis not present

## 2015-01-24 DIAGNOSIS — K602 Anal fissure, unspecified: Secondary | ICD-10-CM | POA: Insufficient documentation

## 2015-01-24 LAB — LIPASE: LIPASE: 49 U/L (ref 11.0–59.0)

## 2015-01-24 LAB — AMYLASE: AMYLASE: 39 U/L (ref 27–131)

## 2015-01-24 MED ORDER — IOHEXOL 350 MG/ML SOLN: 100.0000 mL | Freq: Once | INTRAVENOUS | Status: DC | PRN

## 2015-01-24 MED ORDER — IOHEXOL 350 MG/ML SOLN
100.0000 mL | Freq: Once | INTRAVENOUS | Status: AC | PRN
  Administered 2015-01-24: 100 mL via INTRAVENOUS

## 2015-03-01 ENCOUNTER — Other Ambulatory Visit: Payer: Self-pay | Admitting: Internal Medicine

## 2015-03-01 ENCOUNTER — Ambulatory Visit (INDEPENDENT_AMBULATORY_CARE_PROVIDER_SITE_OTHER): Payer: 59 | Admitting: Internal Medicine

## 2015-03-01 ENCOUNTER — Encounter: Payer: Self-pay | Admitting: Internal Medicine

## 2015-03-01 VITALS — BP 120/70 | HR 78 | Ht 66.0 in | Wt 215.2 lb

## 2015-03-01 DIAGNOSIS — R1031 Right lower quadrant pain: Secondary | ICD-10-CM | POA: Diagnosis not present

## 2015-03-01 MED ORDER — LINACLOTIDE 145 MCG PO CAPS: 145.0000 ug | ORAL_CAPSULE | Freq: Every day | ORAL | Status: DC

## 2015-03-01 NOTE — Progress Notes (Signed)
Jill Cook [birth year] [id number]  Note: This dictation was prepared with Dragon digital system. Any transcriptional errors that result from this procedure are unintentional.   History of Present Illness: This is a 49 year old white female with chronic right lower quadrant abdominal pain. We have seen her in the past for gastroesophageal reflux and dyspepsia. Last OV with L.Hvozdovic PA  2 months ago for diarrhea and right lower quadrant abdominal pain which radiated to the rectum. CT scan of the abdomen was negative. The diarrhea has now resolved and she is having more constipation. The pain in the right lower quadrant is worse when she sitting and it it forces her to stretch her legs or stand up or lay down. It does not bother her at night. Last colonoscopy in April 2007 was a normal exam. There is a family history of colon cancer in maternal aunt. She has tried Stage manager and also Diltiazem 2%  ointment 3 times a day for suspected anal fissure ,she denies any anal irritation or rectal pain separate from the right lower quadrant abdominal pain. Hx of C-section in the past, no other pelvic surgeries    Past Medical History  Diagnosis Date  . Hypertension   . Diverticulosis   . Hyperlipidemia   . History of esophagitis   . Right knee meniscal tear   . OA (osteoarthritis) of knee     RIGHT  . GERD (gastroesophageal reflux disease)     WATCHES DIET  . Arthritis     RIGHT HIP AND WRIST  . Anal fissure   . Anemia     Past Surgical History  Procedure Laterality Date  . Cesarean section  1994  . Anal fissure repair  06-13-2000  . D & c hysteroscopy /  novasure endometrial ablation  11-15-2005  . Knee arthroscopy w/ meniscectomy Right   . Right knee patellofemoral arthroplasty  06-10-2008  . Bunionectomy Right 1991  . Septoplasty  2000  . Transthoracic echocardiogram  06-03-2008    NORMAL /  EF 60%  . Tubal ligation  1996  . Tonsillectomy  AGE 5  . Knee arthroscopy with medial  menisectomy Right 10/06/2013    Procedure: RIGHT KNEE ARTHROSCOPY WITH CHONDROPLASTY;  Surgeon: Sydnee Cabal, MD;  Location: [facility];  Service: Orthopedics;  Laterality: Right;    Allergies  Allergen Reactions  . Codeine Nausea And Vomiting  . Dilaudid [Hydromorphone Hcl]   . Hydromorphone Hcl Nausea And Vomiting  . Morphine Nausea And Vomiting    Family history and social history have been reviewed.  Review of Systems:   The remainder of the 10 point ROS is negative except as outlined in the H&P  Physical Exam: General Appearance Well developed, in no distress Eyes  Non icteric  HEENT  Non traumatic, normocephalic  Mouth No lesion, tongue papillated, no cheilosis Neck Supple without adenopathy, thyroid not enlarged, no carotid bruits, no JVD Lungs Clear to auscultation bilaterally COR Normal S1, normal S2, regular rhythm, no murmur, quiet precordium Abdomen tenderness right lower quadrant without associated mass or fullness. Normoactive bowel sounds. Straight leg raising is negative Laying down does not precipitate the pain. There is no evidence of hernia Rectal not repeated Extremities  No pedal edema Skin No lesions Neurological Alert and oriented x 3 Psychological Normal mood and affect  Assessment and Plan:   49 year old white female with the persistent right lower quadrant discomfort initially in the setting of diarrhea now more of a constipation. CT scan abdomen is negative.  Adhesions would be a possibility. Irritable bowel syndrome or gynecological issue. I doubt Crohn's disease but in the setting of diarrhea may need to rule it out. She will be scheduled for small bowel follow-through. And subsequently for colonoscopy. She will follow-up with Dr. Silverio Decamp . We will start Linzess 145 g daily, she may consider gynecological evaluation including pelvic ultrasound    Delfin Edis 03/01/2015

## 2015-03-01 NOTE — Patient Instructions (Addendum)
We have sent the following medications to your pharmacy for you to pick up at your convenience:  [place] appointment for the abdominal xray is at [facility] on 03/04/15 11 am  please arrive at 10:45 am for an 11 am appointment.  Nothing to eat or drink 6 hours prior to appointment. You will need a follow up with Alonza Bogus PA on 04/12/15 at 9:30 am Dr Dennard Schaumann

## 2015-03-04 ENCOUNTER — Ambulatory Visit (HOSPITAL_COMMUNITY): Payer: 59

## 2015-03-04 ENCOUNTER — Ambulatory Visit (HOSPITAL_COMMUNITY)
Admission: RE | Admit: 2015-03-04 | Discharge: 2015-03-04 | Disposition: A | Discharge status: Home / Self Care | Payer: 59 | Source: Ambulatory Visit | Attending: Internal Medicine | Admitting: Internal Medicine

## 2015-03-04 DIAGNOSIS — R1031 Right lower quadrant pain: Secondary | ICD-10-CM

## 2015-03-04 DIAGNOSIS — K449 Diaphragmatic hernia without obstruction or gangrene: Secondary | ICD-10-CM | POA: Insufficient documentation

## 2015-03-07 ENCOUNTER — Other Ambulatory Visit: Payer: Self-pay | Admitting: *Deleted

## 2015-03-07 MED ORDER — METHYLPREDNISOLONE 4 MG PO TBPK: ORAL_TABLET | ORAL | Status: DC

## 2015-03-18 ENCOUNTER — Telehealth: Payer: Self-pay | Admitting: Internal Medicine

## 2015-03-18 NOTE — Telephone Encounter (Signed)
Spoke with patient and she states she saw her GYN and he does not think the pain is GYN. She is interested in scheduling colonoscopy. She also reports she continues to have pain. Moved OV with Alonza Bogus, PA to 03/29/15.

## 2015-03-18 NOTE — Telephone Encounter (Signed)
Reviewed and agree. She saw Lorri before.

## 2015-03-29 ENCOUNTER — Ambulatory Visit (INDEPENDENT_AMBULATORY_CARE_PROVIDER_SITE_OTHER): Payer: 59 | Admitting: Gastroenterology

## 2015-03-29 ENCOUNTER — Encounter: Payer: Self-pay | Admitting: Gastroenterology

## 2015-03-29 VITALS — BP 108/72 | HR 64 | Ht 66.0 in | Wt 214.6 lb

## 2015-03-29 DIAGNOSIS — R1031 Right lower quadrant pain: Secondary | ICD-10-CM | POA: Diagnosis not present

## 2015-03-29 MED ORDER — NA SULFATE-K SULFATE-MG SULF 17.5-3.13-1.6 GM/177ML PO SOLN: 1.0000 | Freq: Once | ORAL | Status: DC

## 2015-03-29 NOTE — Patient Instructions (Signed)

## 2015-03-29 NOTE — Progress Notes (Addendum)
     03/29/2015 Jill Cook [id number] 11-09-1965   History of Present Illness:  This is a 49 year old white female with chronic right lower quadrant abdominal pain. We have seen her in the past for gastroesophageal reflux and dyspepsia. OV with L.Hvozdovic, PA-C  2 in June for diarrhea and right lower quadrant abdominal pain which radiated to the rectum. CT scan of the abdomen was negative. The diarrhea resolved and then she is having more constipation. The pain in the right lower quadrant is worse when she sitting and it forces her to stretch her legs or stand up or lay down. It does not bother her at night for the most part but did keep her from sleeping yesterday. Last colonoscopy in April 2007 was a normal exam. There is a family history of colon cancer in maternal aunt. She has tried Stage manager and also Diltiazem 2% ointment 3 times a day for suspected anal fissure, although, she denies any anal irritation or rectal pain separate from the right lower quadrant abdominal pain. Hx of C-section in the past, no other pelvic surgeries.  She was then seen by Dr. Olevia Perches earlier this month and SBFT was ordered, which was normal.  She then suggested GYN follow-up, but if unremarkable then possible need for colonoscopy.  Patient saw GYN and they do not think that this is a GYN issue, however, they are willing to perform laparoscopy but would like colonoscopy first.  She is now on Linzess for constipation and is taking levsin, but still has quite a bit of pain at times.     Current Medications, Allergies, Past Medical History, Past Surgical History, Family History and Social History were reviewed in [employer] record.   Physical Exam: BP 108/72 mmHg  Pulse 64  Ht 5\' 6"  (1.676 m)  Wt 214 lb 9.6 oz (97.342 kg)  BMI 34.65 kg/m2 General: Well developed white female in no acute distress Head: Normocephalic and atraumatic Eyes:  Sclerae anicteric, conjunctiva pink  Ears: Normal  auditory acuity Lungs: Clear throughout to auscultation Heart: Regular rate and rhythm Abdomen: Soft, non-distended.  Normal bowel sounds.  Moderate RLQ TTP without R/R/G. Rectal:  Will be done at the time of colonoscopy. Musculoskeletal: Symmetrical with no gross deformities  Extremities: No edema  Neurological: Alert oriented x 4, grossly non-focal Psychological:  Alert and cooperative. Normal mood and affect  Assessment and Recommendations: *49 year old white female with persistent right lower quadrant pain initially in the setting of diarrhea now more of a constipation. CT scan abdomen is negative as well as SBFT. Adhesions would be a possibility. Irritable bowel syndrome or gynecological issue. I doubt Crohn's disease but in the setting of diarrhea may need to rule it out.  Saw GYN and do not think is is GYN related, but they would like her to have a colonoscopy prior to them performing laparoscopy.  Will schedule for colonoscopy with Dr. Havery Moros.  The risks, benefits, and alternatives to colonoscopy were discussed with the patient and she consents to proceed.

## 2015-03-29 NOTE — Progress Notes (Signed)
I agree with the assessment and plan for this patient. I will meet her at her colonoscopy and make further recommendations pending this result.

## 2015-04-12 ENCOUNTER — Ambulatory Visit: Payer: 59 | Admitting: Gastroenterology

## 2015-05-04 ENCOUNTER — Encounter: Payer: Self-pay | Admitting: Gastroenterology

## 2015-05-04 ENCOUNTER — Ambulatory Visit (AMBULATORY_SURGERY_CENTER): Payer: 59 | Admitting: Gastroenterology

## 2015-05-04 VITALS — BP 110/77 | HR 71 | Temp 98.2°F | Resp 23 | Ht 66.0 in | Wt 214.0 lb

## 2015-05-04 DIAGNOSIS — K635 Polyp of colon: Secondary | ICD-10-CM

## 2015-05-04 DIAGNOSIS — D120 Benign neoplasm of cecum: Secondary | ICD-10-CM

## 2015-05-04 DIAGNOSIS — D122 Benign neoplasm of ascending colon: Secondary | ICD-10-CM

## 2015-05-04 DIAGNOSIS — R1031 Right lower quadrant pain: Secondary | ICD-10-CM | POA: Diagnosis present

## 2015-05-04 DIAGNOSIS — K621 Rectal polyp: Secondary | ICD-10-CM | POA: Diagnosis not present

## 2015-05-04 DIAGNOSIS — D128 Benign neoplasm of rectum: Secondary | ICD-10-CM

## 2015-05-04 MED ORDER — SODIUM CHLORIDE 0.9 % IV SOLN: 500.0000 mL | INTRAVENOUS | Status: DC

## 2015-05-04 NOTE — Op Note (Signed)
[facility]  [address] [place], [id number]   COLONOSCOPY PROCEDURE REPORT  PATIENT: Jill Cook, Jill Cook  MR#: [id number] BIRTHDATE: 1966/05/23 , 48  yrs. old GENDER: female ENDOSCOPIST: Yetta Flock, MD REFERRED BY: PROCEDURE DATE:  05/04/2015 PROCEDURE:   Colonoscopy, diagnostic and Colonoscopy with biopsy First Screening Colonoscopy - Avg.  risk and is 50 yrs.  old or older - No.  Prior Negative Screening - Now for repeat screening. N/A  History of Adenoma - Now for follow-up colonoscopy & has been > or = to 3 yrs.  N/A  Polyps removed today? Yes ASA CLASS:   Class II INDICATIONS:Colorectal Neoplasm Risk Assessment for this procedure is average risk and abdominal pain / diarrhea. MEDICATIONS: Propofol 300 mg IV  DESCRIPTION OF PROCEDURE:   After the risks benefits and alternatives of the procedure were thoroughly explained, informed consent was obtained.  The digital rectal exam revealed no abnormalities of the rectum.   The LB [id number]  endoscope was introduced through the anus and advanced to the ileum. No adverse events experienced.   The quality of the prep was adequate The instrument was then slowly withdrawn as the colon was fully examined. Estimated blood loss is zero unless otherwise noted in this procedure report.  COLON FINDINGS: Three diminutive polyps, located in the cecum, ascending colon, and rectum were noted and removed with cold forceps.  Mild diverticulosis was noted in the sigmoid colon.  The remainder of the examined colon was normal without obvious mass lesions or other polyps, or inflammatory changes.  Random biopsies taken to rule out microscopic colitis.  The terminal ileum was normal.  No cause for the patient's abdominal pain was noted on this exam.  Retroflexed views revealed no abnormalities. The time to cecum = 4.2 Withdrawal time = 20.7   The scope was withdrawn and the procedure completed. COMPLICATIONS: There  were no immediate complications.  ENDOSCOPIC IMPRESSION: Three diminutive polyps, located in the cecum, ascending colon, and rectum were noted and removed with cold forceps. Mild diverticulosis was noted in the sigmoid colon Normal remainder of examined colon - biopsies taken to rule out microscopic colitis Normal ileum  RECOMMENDATIONS: 1.  Await pathology results 2.  Resume diet 3   Resume medications  eSigned:  Yetta Flock, MD 05/04/2015 1:51 PM   cc: the patient   PATIENT NAME:  Jill, Cook MR#: [id number]

## 2015-05-04 NOTE — Progress Notes (Signed)
Report to PACU, RN, vss, BBS= Clear.  

## 2015-05-04 NOTE — Patient Instructions (Signed)
Colon polyps removed today. Handout given on polyps. Resume current medications. Call us with any questions or concerns. Thank you!   YOU HAD AN ENDOSCOPIC PROCEDURE TODAY AT [facility]:   Refer to the procedure report that was given to you for any specific questions about what was found during the examination.  If the procedure report does not answer your questions, please call your gastroenterologist to clarify.  If you requested that your care partner not be given the details of your procedure findings, then the procedure report has been included in a sealed envelope for you to review at your convenience later.  YOU SHOULD EXPECT: Some feelings of bloating in the abdomen. Passage of more gas than usual.  Walking can help get rid of the air that was put into your GI tract during the procedure and reduce the bloating. If you had a lower endoscopy (such as a colonoscopy or flexible sigmoidoscopy) you may notice spotting of blood in your stool or on the toilet paper. If you underwent a bowel prep for your procedure, you may not have a normal bowel movement for a few days.  Please Note:  You might notice some irritation and congestion in your nose or some drainage.  This is from the oxygen used during your procedure.  There is no need for concern and it should clear up in a day or so.  SYMPTOMS TO REPORT IMMEDIATELY:   Following lower endoscopy (colonoscopy or flexible sigmoidoscopy):  Excessive amounts of blood in the stool  Significant tenderness or worsening of abdominal pains  Swelling of the abdomen that is new, acute  Fever of 100F or higher   For urgent or emergent issues, a gastroenterologist can be reached at any hour by calling [phone].   DIET: Your first meal following the procedure should be a small meal and then it is ok to progress to your normal diet. Heavy or fried foods are harder to digest and may make you feel nauseous or bloated.  Likewise, meals  heavy in dairy and vegetables can increase bloating.  Drink plenty of fluids but you should avoid alcoholic beverages for 24 hours.  ACTIVITY:  You should plan to take it easy for the rest of today and you should NOT DRIVE or use heavy machinery until tomorrow (because of the sedation medicines used during the test).    FOLLOW UP: Our staff will call the number listed on your records the next business day following your procedure to check on you and address any questions or concerns that you may have regarding the information given to you following your procedure. If we do not reach you, we will leave a message.  However, if you are feeling well and you are not experiencing any problems, there is no need to return our call.  We will assume that you have returned to your regular daily activities without incident.  If any biopsies were taken you will be contacted by phone or by letter within the next 1-3 weeks.  Please call us at [phone] if you have not heard about the biopsies in 3 weeks.    SIGNATURES/CONFIDENTIALITY: You and/or your care partner have signed paperwork which will be entered into your electronic medical record.  These signatures attest to the fact that that the information above on your After Visit Summary has been reviewed and is understood.  Full responsibility of the confidentiality of this discharge information lies with you and/or your care-partner.

## 2015-05-04 NOTE — Progress Notes (Signed)
Called to room to assist during endoscopic procedure.  Patient ID and intended procedure confirmed with present staff. Received instructions for my participation in the procedure from the performing physician.  

## 2015-05-05 ENCOUNTER — Telehealth: Payer: Self-pay | Admitting: *Deleted

## 2015-05-05 NOTE — Telephone Encounter (Signed)
  Follow up Call-  Call back number 05/04/2015  Post procedure Call Back phone  # [phone]  Permission to leave phone message Yes     Patient questions:  Do you have a fever, pain , or abdominal swelling? No. Pain Score  0 *  Have you tolerated food without any problems? Yes.    Have you been able to return to your normal activities? Yes.    Do you have any questions about your discharge instructions: Diet   No. Medications  No. Follow up visit  No.  Do you have questions or concerns about your Care? No.  Actions: * If pain score is 4 or above: No action needed, pain <4.

## 2015-06-02 ENCOUNTER — Encounter: Payer: Self-pay | Admitting: Physician Assistant

## 2015-06-02 ENCOUNTER — Ambulatory Visit (INDEPENDENT_AMBULATORY_CARE_PROVIDER_SITE_OTHER): Payer: 59 | Admitting: Physician Assistant

## 2015-06-02 VITALS — BP 136/80 | HR 76 | Temp 98.3°F | Resp 18 | Wt 223.0 lb

## 2015-06-02 DIAGNOSIS — R600 Localized edema: Secondary | ICD-10-CM | POA: Diagnosis not present

## 2015-06-02 MED ORDER — HYDROCHLOROTHIAZIDE 25 MG PO TABS: 25.0000 mg | ORAL_TABLET | Freq: Every day | ORAL | Status: DC

## 2015-06-02 NOTE — Progress Notes (Signed)
Patient ID: Jill Cook MRN: [id number], DOB: [birth year], 49 y.o. Date of Encounter: 06/02/2015, 10:01 AM    Chief Complaint:  Chief Complaint  Patient presents with  . having alot of edema     HPI: 49 y.o. year old white female works as an Therapist, sports at [employer]. Works nights and says that she just got off work. Has been on her feet nonstop for 13 hours. Is having some swelling.  Says that she noticed on Sunday her watch and rings felt a little tight. However she had not eaten anything that was high sodium that she can think of. Last night was her third night of work. Yesterday when she got off work she noticed swelling in her feet and legs. Again this morning after working all night has swelling so came here for visit.  Says that sometimes if she eats ham or something it will cause some swelling in her hands but otherwise she usually never gets swelling.  She is scheduled for surgery on her right knee in January and says this will be her fourth surgery on that knee.     Home Meds:   Outpatient Prescriptions Prior to Visit  Medication Sig Dispense Refill  . Calcium Carb-Cholecalciferol (CALCIUM-VITAMIN D) 600-400 MG-UNIT TABS Take 1 tablet by mouth daily.    Marland Kitchen GLUCOSAMINE-CHONDROITIN PO Take 1 capsule by mouth daily.    . Linaclotide (LINZESS) 145 MCG CAPS capsule Take 1 capsule (145 mcg total) by mouth daily. 30 capsule 2  . lisinopril (PRINIVIL,ZESTRIL) 10 MG tablet TAKE ONE TABLET BY MOUTH ONE TIME DAILY 30 tablet 11  . pantoprazole (PROTONIX) 40 MG tablet Take 1 tab prior to breakfast. 30 tablet 4  . diltiazem 2 % GEL Apply 1 application topically 3 (three) times daily. (Patient not taking: Reported on 05/04/2015) 30 g 1  . hyoscyamine (LEVSIN/SL) 0.125 MG SL tablet Take 1 tablet 3 times daily as need3ed for cramping and diarrhea, spasms. (Patient not taking: Reported on 05/04/2015) 909 tablet 3   No facility-administered medications prior to visit.     Allergies:  Allergies  Allergen Reactions  . Codeine Nausea And Vomiting  . Dilaudid [Hydromorphone Hcl]   . Hydromorphone Hcl Nausea And Vomiting  . Morphine Nausea And Vomiting      Review of Systems: See HPI for pertinent ROS. All other ROS negative.    Physical Exam: Blood pressure 136/80, pulse 76, temperature 98.3 F (36.8 C), temperature source Oral, resp. rate 18, weight 223 lb (101.152 kg)., Body mass index is 36.01 kg/(m^2). General: WF.  Appears in no acute distress. Neck: Supple. No thyromegaly. No lymphadenopathy. Lungs: Clear bilaterally to auscultation without wheezes, rales, or rhonchi. Breathing is unlabored. Heart: Regular rhythm. No murmurs, rubs, or gallops. Msk:  Strength and tone normal for age. She is wearing knee brace on right knee Extremities/Skin: Left lower leg--1+ edema. Right lower leg--1+ - 2+ edema lower half of calf and at ankle level. No swelling of foot distal to ankle level.  Neuro: Alert and oriented X 3. Moves all extremities spontaneously. Gait is normal. CNII-XII grossly in tact. Psych:  Responds to questions appropriately with a normal affect.     ASSESSMENT AND PLAN:  49 y.o. year old female with  1. Bilateral lower extremity edema I feel that most likely the reason she has more swelling on the right leg than the left is the fact that she has inflammation in that right knee. I recommend that she take HCTZ  25 mg daily for a couple of days. Hopefully this will get rid of her swelling and then it will stay stable. However if she does need to continue HCTZ she can. She is on ACE inhibitor so this should balance out her potassium but if she does and up needing HCTZ on a daily basis than she needs to follow-up for be met in 1 week. If swelling not resolved with taking this daily, then follow-up. - hydrochlorothiazide (HYDRODIURIL) 25 MG tablet; Take 1 tablet (25 mg total) by mouth daily.  Dispense: 30 tablet; Refill: 0   Signed, [address] [place], [place], [facility] 06/02/2015 10:01 AM

## 2015-07-07 ENCOUNTER — Ambulatory Visit (INDEPENDENT_AMBULATORY_CARE_PROVIDER_SITE_OTHER): Payer: 59 | Admitting: Physician Assistant

## 2015-07-07 ENCOUNTER — Encounter: Payer: Self-pay | Admitting: Physician Assistant

## 2015-07-07 VITALS — BP 128/82 | HR 80 | Temp 98.5°F | Resp 18 | Wt 222.0 lb

## 2015-07-07 DIAGNOSIS — F411 Generalized anxiety disorder: Secondary | ICD-10-CM | POA: Diagnosis not present

## 2015-07-07 DIAGNOSIS — F329 Major depressive disorder, single episode, unspecified: Secondary | ICD-10-CM

## 2015-07-07 DIAGNOSIS — R002 Palpitations: Secondary | ICD-10-CM

## 2015-07-07 DIAGNOSIS — F32A Depression, unspecified: Secondary | ICD-10-CM

## 2015-07-07 MED ORDER — CLONAZEPAM 0.5 MG PO TABS: 0.5000 mg | ORAL_TABLET | Freq: Two times a day (BID) | ORAL | Status: DC | PRN

## 2015-07-07 MED ORDER — PAROXETINE HCL 20 MG PO TABS: 20.0000 mg | ORAL_TABLET | Freq: Every day | ORAL | Status: DC

## 2015-07-07 NOTE — Progress Notes (Signed)
Patient ID: Jill Cook MRN: [id number], DOB: 03-03-1966, 49 y.o. Date of Encounter: @DATE @  Chief Complaint:  Chief Complaint  Patient presents with  . c/o severe anxiety and palpitations    HPI: 49 y.o. year old white female  presents with above symptoms.  Says that she has been having some palpitations for about 3 weeks but they have increased to where they're occurring almost daily. Says that she is also had a lot of increased stress recently. Says that her mom's health is not well. Says that her oldest daughter got married in May and she and her husband are living with them so it is crowded and this is adding stress to her. 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  Say that she has had to crying spells at work for no real reason and that it is embarrassing.  Says that she is scheduled for total knee replacement January 23. Says she is not looking forward to it but knows that it is in her best interest. Has had 4 surgeries to that knee.  Says "I've been fighting it as long as I can fight it". Says that her mom is on medication for depression. Says her sister is on medication for anxiety.  Patient states that the only time she has been on medicine for anxiety or depression herself was back in the 1990s after her twins were born. Says that at that time she had 3 young children at home the twins were premature and her husband was working as a Administrator and she was that home with taking care of the babies by herself.  Regarding caffeine intake she says that usually she would have one cup of coffee in the morning but she stopped that for the past 2 weeks because she was wondering if that was contributing to her palpitations.  No other caffeine on a routine basis.  Says that she feels the palpitations when she is having increased stress/anxiety.  Has had no chest pressure heaviness tightness or squeezing.   Past Medical History  Diagnosis Date  .  Hypertension   . Diverticulosis   . Hyperlipidemia   . History of esophagitis   . Right knee meniscal tear   . OA (osteoarthritis) of knee     RIGHT  . GERD (gastroesophageal reflux disease)     WATCHES DIET  . Arthritis     RIGHT HIP AND WRIST  . Anal fissure   . Anemia      Home Meds: Outpatient Prescriptions Prior to Visit  Medication Sig Dispense Refill  . Calcium Carb-Cholecalciferol (CALCIUM-VITAMIN D) 600-400 MG-UNIT TABS Take 1 tablet by mouth daily.    Marland Kitchen GLUCOSAMINE-CHONDROITIN PO Take 1 capsule by mouth daily.    . hydrochlorothiazide (HYDRODIURIL) 25 MG tablet Take 1 tablet (25 mg total) by mouth daily. 30 tablet 0  . lisinopril (PRINIVIL,ZESTRIL) 10 MG tablet TAKE ONE TABLET BY MOUTH ONE TIME DAILY 30 tablet 11  . pantoprazole (PROTONIX) 40 MG tablet Take 1 tab prior to breakfast. 30 tablet 4  . Linaclotide (LINZESS) 145 MCG CAPS capsule Take 1 capsule (145 mcg total) by mouth daily. 30 capsule 2  . meloxicam (MOBIC) 7.5 MG tablet Take 7.5 mg by mouth daily.     No facility-administered medications prior to visit.    Allergies:  Allergies  Allergen Reactions  . Codeine Nausea And Vomiting  . Dilaudid [Hydromorphone Hcl]   . Hydromorphone Hcl Nausea And Vomiting  .  Morphine Nausea And Vomiting    Social History   Social History  . Marital Status: Married    Spouse Name: N/A  . Number of Children: 3  . Years of Education: N/A   Occupational History  . RN     [employer]   Social History Main Topics  . Smoking status: Never Smoker   . Smokeless tobacco: Never Used  . Alcohol Use: Yes     Comment: Occasional  . Drug Use: No  . Sexual Activity: Not on file   Other Topics Concern  . Not on file   Social History Narrative    Family History  Problem Relation Age of Onset  . Hypertension Mother   . Heart disease Father   . Hypertension Paternal Grandfather   . Diabetes Paternal Grandfather   . Thyroid disease Mother   .  Uterine cancer Paternal Aunt   . Colon cancer Maternal Aunt     dx in her 48's  . Esophageal cancer Maternal Grandfather      Review of Systems:  See HPI for pertinent ROS. All other ROS negative.    Physical Exam: Blood pressure 128/82, pulse 80, temperature 98.5 F (36.9 C), temperature source Oral, resp. rate 18, weight 222 lb (100.699 kg)., Body mass index is 35.85 kg/(m^2). General: WF. Appears in no acute distress. Neck: Supple. No thyromegaly. No lymphadenopathy. Lungs: Clear bilaterally to auscultation without wheezes, rales, or rhonchi. Breathing is unlabored. Heart: RRR with S1 S2. No murmurs, rubs, or gallops. Musculoskeletal:  Strength and tone normal for age. Extremities/Skin: Warm and dry. Neuro: Alert and oriented X 3. Moves all extremities spontaneously. Gait is normal. CNII-XII grossly in tact. Psych:  Responds to questions appropriately with a normal affect. mood and affect are appropriate throughout the visit today.    EKG shows normal sinus rhythm with no ectopy. Nonspecific ST-T changes.  ASSESSMENT AND PLAN:  49 y.o. year old female with  1. Generalized anxiety disorder Discussed proper expectations of medications at length. She is to take the Paxil daily even if she is not noticing any improvement in symptoms. She is to call immediately if she feels that the medicine is causing adverse effects or worsening in her mood. She can use the Klonopin as needed if she is feeling increased anxiety/panic. She is to follow-up in 6 weeks. Noted that this will be around the time of her knee surgery so she can just call and follow up over the phone. At that time she can call and report whether her symptoms of anxiety and depression are improved. - PARoxetine (PAXIL) 20 MG tablet; Take 1 tablet (20 mg total) by mouth daily.  Dispense: 30 tablet; Refill: 1 - clonazePAM (KLONOPIN) 0.5 MG tablet; Take 1 tablet (0.5 mg total) by mouth 2 (two) times daily as needed for anxiety.   Dispense: 30 tablet; Refill: 0  2. Depression Discussed proper expectations of medications at length. She is to take the Paxil daily even if she is not noticing any improvement in symptoms. She is to call immediately if she feels that the medicine is causing adverse effects or worsening in her mood. She can use the Klonopin as needed if she is feeling increased anxiety/panic. She is to follow-up in 6 weeks. Noted that this will be around the time of her knee surgery so she can just call and follow up over the phone. At that time she can call and report whether her symptoms of anxiety and depression are  improved. - PARoxetine (PAXIL) 20 MG tablet; Take 1 tablet (20 mg total) by mouth daily.  Dispense: 30 tablet; Refill: 1  3. Palpitations EKG shows normal sinus rhythm with no ectopy. Nonspecific ST-T changes. Will check labs to evaluate for any underlying anemia electrolyte imbalance or thyroid abnormality to be contributing to her palpitations and symptoms. - EKG 12-Lead - CBC with Differential/Platelet - COMPLETE METABOLIC PANEL WITH GFR - TSH   Signed, [address] [place], [place], [facility] 07/07/2015 3:52 PM

## 2015-07-08 LAB — CBC WITH DIFFERENTIAL/PLATELET
BASOS PCT: 1 % (ref 0–1)
Basophils Absolute: 0.1 10*3/uL (ref 0.0–0.1)
EOS ABS: 0.1 10*3/uL (ref 0.0–0.7)
EOS PCT: 2 % (ref 0–5)
HEMATOCRIT: 35 % — AB (ref 36.0–46.0)
Hemoglobin: 12.1 g/dL (ref 12.0–15.0)
LYMPHS PCT: 27 % (ref 12–46)
Lymphs Abs: 1.5 10*3/uL (ref 0.7–4.0)
MCH: 29.8 pg (ref 26.0–34.0)
MCHC: 34.6 g/dL (ref 30.0–36.0)
MCV: 86.2 fL (ref 78.0–100.0)
MONO ABS: 0.4 10*3/uL (ref 0.1–1.0)
MPV: 10.5 fL (ref 8.6–12.4)
Monocytes Relative: 7 % (ref 3–12)
Neutro Abs: 3.6 10*3/uL (ref 1.7–7.7)
Neutrophils Relative %: 63 % (ref 43–77)
PLATELETS: 190 10*3/uL (ref 150–400)
RBC: 4.06 MIL/uL (ref 3.87–5.11)
RDW: 14.2 % (ref 11.5–15.5)
WBC: 5.7 10*3/uL (ref 4.0–10.5)

## 2015-07-08 LAB — COMPLETE METABOLIC PANEL WITH GFR
ALT: 13 U/L (ref 6–29)
AST: 14 U/L (ref 10–35)
Albumin: 4.2 g/dL (ref 3.6–5.1)
Alkaline Phosphatase: 56 U/L (ref 33–115)
BUN: 16 mg/dL (ref 7–25)
CALCIUM: 9.2 mg/dL (ref 8.6–10.2)
CHLORIDE: 100 mmol/L (ref 98–110)
CO2: 24 mmol/L (ref 20–31)
Creat: 0.89 mg/dL (ref 0.50–1.10)
GFR, EST AFRICAN AMERICAN: 88 mL/min (ref >=60)
GFR, Est Non African American: 76 mL/min (ref >=60)
Glucose, Bld: 93 mg/dL (ref 70–99)
POTASSIUM: 3.9 mmol/L (ref 3.5–5.3)
Sodium: 139 mmol/L (ref 135–146)
Total Bilirubin: 0.4 mg/dL (ref 0.2–1.2)
Total Protein: 6.5 g/dL (ref 6.1–8.1)

## 2015-07-08 LAB — TSH: TSH: 1.266 u[IU]/mL (ref 0.350–4.500)

## 2015-07-12 ENCOUNTER — Telehealth: Payer: Self-pay | Admitting: *Deleted

## 2015-07-12 NOTE — Telephone Encounter (Signed)
Pt called stating was put on paxil and states she had taken this medication years ago and had caused her to have weight gain and done research on it to check and is one of the symptoms, wants to know if you could put her on something else like wellbutrin or effexor.   Also pt states the Jeanella Craze is making her drowsy "knocks her out" and works as Marine scientist on night shift wants also something else.  [facility] pharmacy

## 2015-07-13 MED ORDER — ALPRAZOLAM 0.25 MG PO TABS: 0.2500 mg | ORAL_TABLET | Freq: Two times a day (BID) | ORAL | Status: DC | PRN

## 2015-07-13 MED ORDER — BUPROPION HCL ER (XL) 150 MG PO TB24: ORAL_TABLET | ORAL | Status: DC

## 2015-07-13 NOTE — Telephone Encounter (Signed)
Spoke to patient.  Made aware of medication changes.  One month follow up appt made.  Rx's faxed to pharmacy.

## 2015-07-13 NOTE — Telephone Encounter (Signed)
Regarding concern of weight gain-- would use either Wellbutrin or Pristiq.  See if she wants to research Pristiq.  If not,  then would do Wellbutrin XL  150 mg every morning for 4 days then increase to taking 2 of these to equal 300 mg every morning. Dispense #60+0 refill. At the end of this supply will have her follow-up with me will know what dose to order next.  Remove the clonazepam and in place of that gives Xanax 0.25 mg 1 by mouth twice a day when necessary #60+0

## 2015-07-31 DIAGNOSIS — K635 Polyp of colon: Secondary | ICD-10-CM

## 2015-07-31 HISTORY — DX: Polyp of colon: K63.5

## 2015-08-12 NOTE — H&P (Signed)
TOTAL KNEE REVISION ADMISSION H&P  Patient is being admitted for conversion from right UKR to a TKA.  Subjective:  Chief Complaint:   Right knee pain S/P UKR  HPI: Jill Cook, 50 y.o. female, has a history of pain and functional disability in the right knee(s) due to failed previous arthroplasty and patient has failed non-surgical conservative treatments for greater than 12 weeks to include NSAID's and/or analgesics, corticosteriod injections, viscosupplementation injections and activity modification. The indications for the revision of the total knee arthroplasty are progressive or substantial perporsthetic bone loss. Onset of symptoms was gradual starting 2+ years ago with gradually worsening course since that time.  Prior procedures on the right knee(s) include unicompartmental arthroplasty per Dr. Theda Sers in 2009.  Patient currently rates pain in the right knee(s) at 8 out of 10 with activity. There is worsening of pain with activity and weight bearing, pain that interferes with activities of daily living, pain with passive range of motion, crepitus and joint swelling.  Patient has evidence of previous UKR by imaging studies. This condition presents safety issues increasing the risk of falls.  There is no current active infection.  Risks, benefits and expectations were discussed with the patient.  Risks including but not limited to the risk of anesthesia, blood clots, nerve damage, blood vessel damage, failure of the prosthesis, infection and up to and including death.  Patient understand the risks, benefits and expectations and wishes to proceed with surgery.   PCP: Odette Fraction, MD  D/C Plans:      Home with HHPT  Post-op Meds:       No Rx given  Tranexamic Acid:      To be given - IV    Decadron:      Is to be given  FYI:     ASA post-op  Norco (pt states that she has had before without issues)    Patient Active Problem List   Diagnosis Date Noted  . RLQ abdominal pain  03/29/2015  . S/P right knee arthroscopy 10/06/2013  . SPLENOMEGALY 10/15/2008  . ESOPHAGITIS 10/14/2008  . NAUSEA 10/14/2008  . DYSPEPSIA 04/14/2008  . RECTAL FISSURE 04/14/2008  . Abdominal pain, left upper quadrant 04/14/2008  . GERD 04/12/2008  . DUODENITIS 04/12/2008  . IRRITABLE BOWEL SYNDROME 04/12/2008  . ANEMIA, IRON DEFICIENCY, HX OF 04/12/2008  . ESOPHAGITIS, HX OF 04/12/2008   Past Medical History  Diagnosis Date  . Hypertension   . Diverticulosis   . Hyperlipidemia   . History of esophagitis   . Right knee meniscal tear   . OA (osteoarthritis) of knee     RIGHT  . GERD (gastroesophageal reflux disease)     WATCHES DIET  . Arthritis     RIGHT HIP AND WRIST  . Anal fissure   . Anemia     Past Surgical History  Procedure Laterality Date  . Cesarean section  1994  . Anal fissure repair  06-13-2000  . D & c hysteroscopy /  novasure endometrial ablation  11-15-2005  . Knee arthroscopy w/ meniscectomy Right   . Right knee patellofemoral arthroplasty  06-10-2008  . Bunionectomy Right 1991  . Septoplasty  2000  . Transthoracic echocardiogram  06-03-2008    NORMAL /  EF 60%  . Tubal ligation  1996  . Tonsillectomy  AGE 19  . Knee arthroscopy with medial menisectomy Right 10/06/2013    Procedure: RIGHT KNEE ARTHROSCOPY WITH CHONDROPLASTY;  Surgeon: Sydnee Cabal, MD;  Location: [facility];  Service: Orthopedics;  Laterality: Right;    No prescriptions prior to admission   Allergies  Allergen Reactions  . Codeine Nausea And Vomiting  . Hydromorphone Hcl Nausea And Vomiting  . Morphine Hives and Nausea And Vomiting    Social History  Substance Use Topics  . Smoking status: Never Smoker   . Smokeless tobacco: Never Used  . Alcohol Use: Yes     Comment: Occasional    Family History  Problem Relation Age of Onset  . Hypertension Mother   . Heart disease Father   . Hypertension Paternal Grandfather   . Diabetes Paternal Grandfather   .  Thyroid disease Mother   . Uterine cancer Paternal Aunt   . Colon cancer Maternal Aunt     dx in her 71's  . Esophageal cancer Maternal Grandfather      Review of Systems  Constitutional: Negative.   HENT: Negative.   Eyes: Negative.   Respiratory: Negative.   Cardiovascular: Negative.   Gastrointestinal: Positive for heartburn.  Genitourinary: Negative.   Musculoskeletal: Positive for joint pain.  Skin: Negative.   Neurological: Negative.   Endo/Heme/Allergies: Negative.   Psychiatric/Behavioral: Negative.      Objective:  Physical Exam  Constitutional: She is oriented to person, place, and time. She appears well-developed.  HENT:  Head: Normocephalic.  Eyes: Pupils are equal, round, and reactive to light.  Neck: Neck supple. No JVD present. No tracheal deviation present. No thyromegaly present.  Cardiovascular: Normal rate, regular rhythm, normal heart sounds and intact distal pulses.   Respiratory: Effort normal and breath sounds normal. No stridor. No respiratory distress. She has no wheezes.  GI: Soft. There is no tenderness. There is no guarding.  Musculoskeletal:       Right knee: She exhibits decreased range of motion, swelling, laceration (healed previous incision) and bony tenderness. She exhibits no ecchymosis, no deformity and no erythema. Tenderness found.  Lymphadenopathy:    She has no cervical adenopathy.  Neurological: She is alert and oriented to person, place, and time.  Skin: Skin is warm and dry.  Psychiatric: She has a normal mood and affect.      Labs:  Estimated body mass index is 34.56 kg/(m^2) as calculated from the following:   Height as of 05/04/15: 5\' 6"  (1.676 m).   Weight as of 05/04/15: 97.07 kg (214 lb).  Imaging Review Plain radiographs demonstrate previous right UKR of the right knee(s). The overall alignment is neutral. The bone quality appears to be good for age and reported activity level.   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   The patient history, physical examination, clinical judgment of the provider and imaging studies are consistent with failure of the right knee(s), previous unicompartmental knee arthroplasty. Revision total knee arthroplasty is deemed medically necessary. The treatment options including medical management, injection therapy, arthroscopy and revision arthroplasty were discussed at length. The risks and benefits of revision total knee arthroplasty were presented and reviewed. The risks due to aseptic loosening, infection, stiffness, patella tracking problems, thromboembolic complications and other imponderables were discussed. The patient acknowledged the explanation, agreed to proceed with the plan and consent was signed. Patient is being admitted for inpatient treatment for surgery, pain control, PT, OT, prophylactic antibiotics, VTE prophylaxis, progressive ambulation and ADL's and discharge planning.The patient is planning to be discharged home with home health services.     West Pugh Dinesh Ulysse   PA-C  08/12/2015, 1:41 PM

## 2015-08-17 ENCOUNTER — Encounter: Payer: Self-pay | Admitting: Physician Assistant

## 2015-08-17 ENCOUNTER — Ambulatory Visit (INDEPENDENT_AMBULATORY_CARE_PROVIDER_SITE_OTHER): Payer: 59 | Admitting: Physician Assistant

## 2015-08-17 VITALS — BP 124/72 | HR 96 | Temp 98.1°F | Resp 18 | Wt 219.0 lb

## 2015-08-17 DIAGNOSIS — F329 Major depressive disorder, single episode, unspecified: Secondary | ICD-10-CM

## 2015-08-17 DIAGNOSIS — F411 Generalized anxiety disorder: Secondary | ICD-10-CM | POA: Diagnosis not present

## 2015-08-17 DIAGNOSIS — R609 Edema, unspecified: Secondary | ICD-10-CM

## 2015-08-17 DIAGNOSIS — F32A Depression, unspecified: Secondary | ICD-10-CM

## 2015-08-17 DIAGNOSIS — F341 Dysthymic disorder: Secondary | ICD-10-CM | POA: Insufficient documentation

## 2015-08-17 MED ORDER — FUROSEMIDE 20 MG PO TABS: 20.0000 mg | ORAL_TABLET | Freq: Every day | ORAL | Status: DC

## 2015-08-17 MED ORDER — POTASSIUM CHLORIDE CRYS ER 10 MEQ PO TBCR: 10.0000 meq | EXTENDED_RELEASE_TABLET | Freq: Two times a day (BID) | ORAL | Status: DC

## 2015-08-17 MED ORDER — BUPROPION HCL ER (XL) 300 MG PO TB24: 300.0000 mg | ORAL_TABLET | Freq: Every day | ORAL | Status: DC

## 2015-08-17 NOTE — Patient Instructions (Addendum)
YOUR PROCEDURE IS SCHEDULED ON : 08/22/15  REPORT TO [facility] MAIN ENTRANCE FOLLOW SIGNS TO EAST ELEVATOR - GO TO 3rd FLOOR CHECK IN AT 3 EAST NURSES STATION (SHORT STAY) AT: 8:00 AM  CALL THIS NUMBER IF YOU HAVE PROBLEMS THE MORNING OF SURGERY [phone]  REMEMBER:ONLY 1 PER PERSON MAY GO TO SHORT STAY WITH YOU TO GET READY THE MORNING OF YOUR SURGERY  DO NOT EAT FOOD OR DRINK LIQUIDS AFTER MIDNIGHT  TAKE THESE MEDICINES THE MORNING OF SURGERY:  BUPROPION / CETIRIZINE  YOU MAY NOT HAVE ANY METAL ON YOUR BODY INCLUDING HAIR PINS AND PIERCING'S. DO NOT WEAR JEWELRY, MAKEUP, LOTIONS, POWDERS OR PERFUMES. DO NOT WEAR NAIL POLISH. DO NOT SHAVE 48 HRS PRIOR TO SURGERY. MEN MAY SHAVE FACE AND NECK.  DO NOT Jill Cook. [facility] IS NOT RESPONSIBLE FOR VALUABLES.  CONTACTS, DENTURES OR PARTIALS MAY NOT BE WORN TO SURGERY. LEAVE SUITCASE IN CAR. CAN BE BROUGHT TO ROOM AFTER SURGERY.  PATIENTS DISCHARGED THE DAY OF SURGERY WILL NOT BE ALLOWED TO DRIVE HOME.  PLEASE READ OVER THE FOLLOWING INSTRUCTION SHEETS _________________________________________________________________________________                                          [facility] - PREPARING FOR SURGERY  Before surgery, you can play an important role.  Because skin is not sterile, your skin needs to be as free of germs as possible.  You can reduce the number of germs on your skin by washing with CHG (chlorahexidine gluconate) soap before surgery.  CHG is an antiseptic cleaner which kills germs and bonds with the skin to continue killing germs even after washing. Please DO NOT use if you have an allergy to CHG or antibacterial soaps.  If your skin becomes reddened/irritated stop using the CHG and inform your nurse when you arrive at Short Stay. Do not shave (including legs and underarms) for at least 48 hours prior to the first CHG shower.  You may shave your face. Please follow these  instructions carefully:   1.  Shower with CHG Soap the night before surgery and the  morning of Surgery.   2.  If you choose to wash your hair, wash your hair first as usual with your  normal  Shampoo.   3.  After you shampoo, rinse your hair and body thoroughly to remove the  shampoo.                                         4.  Use CHG as you would any other liquid soap.  You can apply chg directly  to the skin and wash . Gently wash with scrungie or clean wascloth    5.  Apply the CHG Soap to your body ONLY FROM THE NECK DOWN.   Do not use on open                           Wound or open sores. Avoid contact with eyes, ears mouth and genitals (private parts).                        Genitals (private parts) with your normal soap.  6.  Wash thoroughly, paying special attention to the area where your surgery  will be performed.   7.  Thoroughly rinse your body with warm water from the neck down.   8.  DO NOT shower/wash with your normal soap after using and rinsing off  the CHG Soap .                9.  Pat yourself dry with a clean towel.             10.  Wear clean night clothes to bed after shower             11.  Place clean sheets on your bed the night of your first shower and do not  sleep with pets.  Day of Surgery : Do not apply any lotions/deodorants the morning of surgery.  Please wear clean clothes to the hospital/surgery center.  FAILURE TO FOLLOW THESE INSTRUCTIONS MAY RESULT IN THE CANCELLATION OF YOUR SURGERY    PATIENT SIGNATURE_________________________________  ______________________________________________________________________     Adam Phenix  An incentive spirometer is a tool that can help keep your lungs clear and active. This tool measures how well you are filling your lungs with each breath. Taking long deep breaths may help reverse or decrease the chance of developing breathing (pulmonary) problems (especially infection)  following:  A long period of time when you are unable to move or be active. BEFORE THE PROCEDURE   If the spirometer includes an indicator to show your best effort, your nurse or respiratory therapist will set it to a desired goal.  If possible, sit up straight or lean slightly forward. Try not to slouch.  Hold the incentive spirometer in an upright position. INSTRUCTIONS FOR USE   Sit on the edge of your bed if possible, or sit up as far as you can in bed or on a chair.  Hold the incentive spirometer in an upright position.  Breathe out normally.  Place the mouthpiece in your mouth and seal your lips tightly around it.  Breathe in slowly and as deeply as possible, raising the piston or the ball toward the top of the column.  Hold your breath for 3-5 seconds or for as long as possible. Allow the piston or ball to fall to the bottom of the column.  Remove the mouthpiece from your mouth and breathe out normally.  Rest for a few seconds and repeat Steps 1 through 7 at least 10 times every 1-2 hours when you are awake. Take your time and take a few normal breaths between deep breaths.  The spirometer may include an indicator to show your best effort. Use the indicator as a goal to work toward during each repetition.  After each set of 10 deep breaths, practice coughing to be sure your lungs are clear. If you have an incision (the cut made at the time of surgery), support your incision when coughing by placing a pillow or rolled up towels firmly against it. Once you are able to get out of bed, walk around indoors and cough well. You may stop using the incentive spirometer when instructed by your caregiver.  RISKS AND COMPLICATIONS  Take your time so you do not get dizzy or light-headed.  If you are in pain, you may need to take or ask for pain medication before doing incentive spirometry. It is harder to take a deep breath if you are having pain. AFTER USE  Rest and breathe slowly  and  easily.  It can be helpful to keep track of a log of your progress. Your caregiver can provide you with a simple table to help with this. If you are using the spirometer at home, follow these instructions: [place] IF:   You are having difficultly using the spirometer.  You have trouble using the spirometer as often as instructed.  Your pain medication is not giving enough relief while using the spirometer.  You develop fever of 100.5 F (38.1 C) or higher. SEEK IMMEDIATE MEDICAL CARE IF:   You cough up bloody sputum that had not been present before.  You develop fever of 102 F (38.9 C) or greater.  You develop worsening pain at or near the incision site. MAKE SURE YOU:   Understand these instructions.  Will watch your condition.  Will get help right away if you are not doing well or get worse. Document Released: 11/26/2006 Document Revised: 10/08/2011 Document Reviewed: 01/27/2007 Exit[employer] Patient Information 2014 ExitCare, [place].   ________________________________________________________________________  WHAT IS A BLOOD TRANSFUSION? Blood Transfusion Information  A transfusion is the replacement of blood or some of its parts. Blood is made up of multiple cells which provide different functions.  Red blood cells carry oxygen and are used for blood loss replacement.  White blood cells fight against infection.  Platelets control bleeding.  Plasma helps clot blood.  Other blood products are available for specialized needs, such as hemophilia or other clotting disorders. BEFORE THE TRANSFUSION  Who gives blood for transfusions?   Healthy volunteers who are fully evaluated to make sure their blood is safe. This is blood bank blood. Transfusion therapy is the safest it has ever been in the practice of medicine. Before blood is taken from a donor, a complete history is taken to make sure that person has no history of diseases nor engages in risky social  behavior (examples are intravenous drug use or sexual activity with multiple partners). The donor's travel history is screened to minimize risk of transmitting infections, such as malaria. The donated blood is tested for signs of infectious diseases, such as HIV and hepatitis. The blood is then tested to be sure it is compatible with you in order to minimize the chance of a transfusion reaction. If you or a relative donates blood, this is often done in anticipation of surgery and is not appropriate for emergency situations. It takes many days to process the donated blood. RISKS AND COMPLICATIONS Although transfusion therapy is very safe and saves many lives, the main dangers of transfusion include:   Getting an infectious disease.  Developing a transfusion reaction. This is an allergic reaction to something in the blood you were given. Every precaution is taken to prevent this. The decision to have a blood transfusion has been considered carefully by your caregiver before blood is given. Blood is not given unless the benefits outweigh the risks. AFTER THE TRANSFUSION  Right after receiving a blood transfusion, you will usually feel much better and more energetic. This is especially true if your red blood cells have gotten low (anemic). The transfusion raises the level of the red blood cells which carry oxygen, and this usually causes an energy increase.  The nurse administering the transfusion will monitor you carefully for complications. HOME CARE INSTRUCTIONS  No special instructions are needed after a transfusion. You may find your energy is better. Speak with your caregiver about any limitations on activity for underlying diseases you may have. SEEK MEDICAL CARE IF:  Your condition is not improving after your transfusion.  You develop redness or irritation at the intravenous (IV) site. SEEK IMMEDIATE MEDICAL CARE IF:  Any of the following symptoms occur over the next 12 hours:  Shaking  chills.  You have a temperature by mouth above 102 F (38.9 C), not controlled by medicine.  Chest, back, or muscle pain.  People around you feel you are not acting correctly or are confused.  Shortness of breath or difficulty breathing.  Dizziness and fainting.  You get a rash or develop hives.  You have a decrease in urine output.  Your urine turns a dark color or changes to pink, red, or brown. Any of the following symptoms occur over the next 10 days:  You have a temperature by mouth above 102 F (38.9 C), not controlled by medicine.  Shortness of breath.  Weakness after normal activity.  The white part of the eye turns yellow (jaundice).  You have a decrease in the amount of urine or are urinating less often.  Your urine turns a dark color or changes to pink, red, or brown. Document Released: 07/13/2000 Document Revised: 10/08/2011 Document Reviewed: 03/01/2008 [facility] Patient Information 2014 [place], [place].  _______________________________________________________________________

## 2015-08-17 NOTE — Progress Notes (Signed)
Patient ID: TIAIRRA COONRADT MRN: [id number], DOB: 08-28-1965, 50 y.o. Date of Encounter: @DATE @  Chief Complaint:  Chief Complaint  Patient presents with  . 1 mth follow up    HPI: 50 y.o. year old white female  presents for 1 month f/u.   THE FOLLOWING IS COPIED FROM HER OV NOTE 07/06/2014:  Says that she has been having some palpitations for about 3 weeks but they have increased to where they're occurring almost daily. Says that she is also had a lot of increased stress recently. Says that her mom's health is not well. Says that her oldest daughter got married in May and she and her husband are living with them so it is crowded and this is adding stress to her. 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  Say that she has had to crying spells at work for no real reason and that it is embarrassing.  Says that she is scheduled for total knee replacement January 23. Says she is not looking forward to it but knows that it is in her best interest. Has had 4 surgeries to that knee.  Says "I've been fighting it as long as I can fight it". Says that her mom is on medication for depression. Says her sister is on medication for anxiety.  Patient states that the only time she has been on medicine for anxiety or depression herself was back in the 1990s after her twins were born. Says that at that time she had 3 young children at home the twins were premature and her husband was working as a Administrator and she was that home with taking care of the babies by herself.  Regarding caffeine intake she says that usually she would have one cup of coffee in the morning but she stopped that for the past 2 weeks because she was wondering if that was contributing to her palpitations.  No other caffeine on a routine basis.  Says that she feels the palpitations when she is having increased stress/anxiety.  Has had no chest pressure heaviness tightness or squeezing.  At that visit--checked  labs---CBC, CMET, TSH---Normal.  At that visit I prescribed Paxil and Klonopin.  However she called here on 07/12/15 and reported that she had taken Paxil years ago and it caused weight gain. She had researched it and weight gain was on their list of possible side effects and at that time she was wanting to start something else such as Wellbutrin or Effexor. Also at that phone call she reported that the clonazepam made her very drowsy and "knocked her out" and with her working as a Marine scientist on night shift needed something else.  At that time prescribed Wellbutrin XL 150 mg every morning for 4 days then increase to taking 2 of these every morning. Also changed the clonazepam to Xanax 0.25 mg.   TODAY---08/18/2015: Regarding the Wellbutrin she states that she did take that as directed and is now taking 2 of them daily. She says it definitely has not caused any adverse effects and does not make her feel "crazy." She says that she thinks that it is helping some. However she is only been on this right at 4 weeks -- probably just now getting to where she is getting full effect for this dose. She says that she is not having as many crying spells and thinks that this part definitely is improving. Regarding the Klonopin she says that she took that and then  slept for 10 hours and that she never does that.  Says that since switching to the Xanax that that has been working fine for her and does not knock her out nearly so much. Says that she is only used about 4 of the Xanax.  Says that she feels like she is retaining fluid. Says " everything feels tight--- her legs, feet,  her fingers." Says that with taking HCTZ she doesn't feel like this is making her urinate increased amount.  Is scheduled for knee replacement January 23. Says that she will be out of work at least 2 months and is hoping that if she works hard she can get back to work the beginning of April.     Past Medical History  Diagnosis Date  .  Hypertension   . Diverticulosis   . Hyperlipidemia   . History of esophagitis   . Right knee meniscal tear   . OA (osteoarthritis) of knee     RIGHT  . GERD (gastroesophageal reflux disease)     WATCHES DIET  . Arthritis     RIGHT HIP AND WRIST  . Anal fissure   . Anemia      Home Meds: Outpatient Prescriptions Prior to Visit  Medication Sig Dispense Refill  . ALPRAZolam (XANAX) 0.25 MG tablet Take 1 tablet (0.25 mg total) by mouth 2 (two) times daily as needed for anxiety. 60 tablet 0  . Calcium Carb-Cholecalciferol (CALCIUM-VITAMIN D) 600-400 MG-UNIT TABS Take 1 tablet by mouth daily.    . cetirizine (ZYRTEC) 10 MG tablet Take 10 mg by mouth daily.    Marland Kitchen GLUCOSAMINE-CHONDROITIN PO Take 1 capsule by mouth daily.    . Linaclotide (LINZESS) 145 MCG CAPS capsule Take 1 capsule (145 mcg total) by mouth daily. (Patient taking differently: Take 145 mcg by mouth daily as needed (for constipation). ) 30 capsule 2  . lisinopril (PRINIVIL,ZESTRIL) 10 MG tablet TAKE ONE TABLET BY MOUTH ONE TIME DAILY 30 tablet 11  . buPROPion (WELLBUTRIN XL) 150 MG 24 hr tablet Take one tablet by mouth in the morning for 4 days, then start two tablets by mouth in the morning daily. (Patient taking differently: Take 300 mg by mouth daily. ) 60 tablet 0   No facility-administered medications prior to visit.    Allergies:  Allergies  Allergen Reactions  . Codeine Nausea And Vomiting  . Hydromorphone Hcl Nausea And Vomiting  . Morphine Hives and Nausea And Vomiting    Social History   Social History  . Marital Status: Married    Spouse Name: N/A  . Number of Children: 3  . Years of Education: N/A   Occupational History  . RN     [employer]   Social History Main Topics  . Smoking status: Never Smoker   . Smokeless tobacco: Never Used  . Alcohol Use: Yes     Comment: Occasional  . Drug Use: No  . Sexual Activity: Not on file   Other Topics Concern  . Not on file    Social History Narrative    Family History  Problem Relation Age of Onset  . Hypertension Mother   . Heart disease Father   . Hypertension Paternal Grandfather   . Diabetes Paternal Grandfather   . Thyroid disease Mother   . Uterine cancer Paternal Aunt   . Colon cancer Maternal Aunt     dx in her 62's  . Esophageal cancer Maternal Grandfather      Review of Systems:  See HPI for pertinent ROS. All other ROS negative.    Physical Exam: Blood pressure 124/72, pulse 96, temperature 98.1 F (36.7 C), temperature source Oral, resp. rate 18, weight 219 lb (99.338 kg)., Body mass index is 35.36 kg/(m^2). General: WF. Appears in no acute distress. Neck: Supple. No thyromegaly. No lymphadenopathy. Lungs: Clear bilaterally to auscultation without wheezes, rales, or rhonchi. Breathing is unlabored. Heart: RRR with S1 S2. No murmurs, rubs, or gallops. Musculoskeletal:  Strength and tone normal for age. Extremities/Skin: Warm and dry. No pitting edema of feet, ankles, hands.  Neuro: Alert and oriented X 3. Moves all extremities spontaneously. Gait is normal. CNII-XII grossly in tact. Psych:  Responds to questions appropriately with a normal affect. mood and affect are appropriate throughout the visit today.      ASSESSMENT AND PLAN:  50 y.o. year old female with   1. Edema, unspecified type Will stop HCTZ. Will change this to Lasix 20 mg and potassium 10 mEq. She will return for lab work and follow-up as soon as she can after this knee surgery. - furosemide (LASIX) 20 MG tablet; Take 1 tablet (20 mg total) by mouth daily.  Dispense: 30 tablet; Refill: 3 - potassium chloride (K-DUR,KLOR-CON) 10 MEQ tablet; Take 1 tablet (10 mEq total) by mouth 2 (two) times daily.  Dispense: 30 tablet; Refill: 3  2. Generalized anxiety disorder She will continue the Wellbutrin at 300 mg daily and monitor symptoms. She will return for office visit in 1-2 months whenever she can depending on her  recuperation from this knee surgery. If she needs to follow-up in the interim she can call me and let me know how she is feeling once she is on this dose for another couple of weeks. - buPROPion (WELLBUTRIN XL) 300 MG 24 hr tablet; Take 1 tablet (300 mg total) by mouth daily.  Dispense: 30 tablet; Refill: 3  3. Depression She will continue the Wellbutrin at 300 mg daily and monitor symptoms. She will return for office visit in 1-2 months whenever she can depending on her recuperation from this knee surgery. If she needs to follow-up in the interim she can call me and let me know how she is feeling once she is on this dose for another couple of weeks. - buPROPion (WELLBUTRIN XL) 300 MG 24 hr tablet; Take 1 tablet (300 mg total) by mouth daily.  Dispense: 30 tablet; Refill: 3     Signed, [address] [place], [place], [place] 08/17/2015 11:02 AM

## 2015-08-18 ENCOUNTER — Encounter (HOSPITAL_COMMUNITY)
Admission: RE | Admit: 2015-08-18 | Discharge: 2015-08-18 | Disposition: A | Discharge status: Home / Self Care | Payer: 59 | Source: Ambulatory Visit | Attending: Orthopedic Surgery | Admitting: Orthopedic Surgery

## 2015-08-18 ENCOUNTER — Encounter (HOSPITAL_COMMUNITY): Payer: Self-pay

## 2015-08-18 DIAGNOSIS — Z01812 Encounter for preprocedural laboratory examination: Secondary | ICD-10-CM | POA: Diagnosis not present

## 2015-08-18 DIAGNOSIS — Z0183 Encounter for blood typing: Secondary | ICD-10-CM | POA: Insufficient documentation

## 2015-08-18 DIAGNOSIS — T8484XA Pain due to internal orthopedic prosthetic devices, implants and grafts, initial encounter: Secondary | ICD-10-CM | POA: Insufficient documentation

## 2015-08-18 DIAGNOSIS — Z96651 Presence of right artificial knee joint: Secondary | ICD-10-CM | POA: Diagnosis not present

## 2015-08-18 HISTORY — DX: Anxiety disorder, unspecified: F41.9

## 2015-08-18 LAB — URINE MICROSCOPIC-ADD ON

## 2015-08-18 LAB — URINALYSIS, ROUTINE W REFLEX MICROSCOPIC
Bilirubin Urine: NEGATIVE
GLUCOSE, UA: NEGATIVE mg/dL
Ketones, ur: NEGATIVE mg/dL
LEUKOCYTES UA: NEGATIVE
Nitrite: NEGATIVE
PH: 5 (ref 5.0–8.0)
PROTEIN: NEGATIVE mg/dL
Specific Gravity, Urine: 1.028 (ref 1.005–1.030)

## 2015-08-18 LAB — APTT: APTT: 29 s (ref 24–37)

## 2015-08-18 LAB — PROTIME-INR
INR: 1.12 (ref 0.00–1.49)
PROTHROMBIN TIME: 14.6 s (ref 11.6–15.2)

## 2015-08-18 LAB — BASIC METABOLIC PANEL
ANION GAP: 9 (ref 5–15)
BUN: 18 mg/dL (ref 6–20)
CALCIUM: 9.8 mg/dL (ref 8.9–10.3)
CO2: 27 mmol/L (ref 22–32)
Chloride: 108 mmol/L (ref 101–111)
Creatinine, Ser: 0.99 mg/dL (ref 0.44–1.00)
GFR calc Af Amer: >60 mL/min (ref >60)
Glucose, Bld: 106 mg/dL — ABNORMAL HIGH (ref 65–99)
POTASSIUM: 4.2 mmol/L (ref 3.5–5.1)
SODIUM: 144 mmol/L (ref 135–145)

## 2015-08-18 LAB — SURGICAL PCR SCREEN
MRSA, PCR: NEGATIVE
Staphylococcus aureus: POSITIVE — AB

## 2015-08-18 LAB — CBC
HEMATOCRIT: 35.2 % — AB (ref 36.0–46.0)
HEMOGLOBIN: 11.6 g/dL — AB (ref 12.0–15.0)
MCH: 29.1 pg (ref 26.0–34.0)
MCHC: 33 g/dL (ref 30.0–36.0)
MCV: 88.4 fL (ref 78.0–100.0)
Platelets: 166 10*3/uL (ref 150–400)
RBC: 3.98 MIL/uL (ref 3.87–5.11)
RDW: 13.2 % (ref 11.5–15.5)
WBC: 5.1 10*3/uL (ref 4.0–10.5)

## 2015-08-18 NOTE — Progress Notes (Signed)
Abnormal UA and PCR faxed to Dr.Olin No Rx called for Mupuricin due to <5 days before surgery   for Tx - will get Betadine nasal swab on arrival day of surgery

## 2015-08-19 NOTE — Anesthesia Preprocedure Evaluation (Addendum)
Anesthesia Evaluation  Patient identified by MRN, date of birth, ID band Patient awake    Reviewed: Allergy & Precautions, H&P , NPO status , Patient's Chart, lab work & pertinent test results  Airway Mallampati: III   Neck ROM: Full    Dental  (+) Teeth Intact, Dental Advisory Given   Pulmonary neg pulmonary ROS,    breath sounds clear to auscultation       Cardiovascular hypertension, Pt. on medications  Rhythm:Regular     Neuro/Psych Anxiety Depression negative neurological ROS  negative psych ROS   GI/Hepatic negative GI ROS, Neg liver ROS, GERD  Medicated,  Endo/Other  negative endocrine ROS  Renal/GU negative Renal ROS     Musculoskeletal negative musculoskeletal ROS (+)   Abdominal (+)  Abdomen: soft.    Peds  Hematology  (+) Blood dyscrasia, , 11/35, plts, 166, INR 1.12   Anesthesia Other Findings   Reproductive/Obstetrics negative OB ROS                            Anesthesia Physical  Anesthesia Plan  ASA: II  Anesthesia Plan: Spinal   Post-op Pain Management:    Induction:   Airway Management Planned: Nasal Cannula  Additional Equipment:   Intra-op Plan:   Post-operative Plan:   Informed Consent: I have reviewed the patients History and Physical, chart, labs and discussed the procedure including the risks, benefits and alternatives for the proposed anesthesia with the patient or authorized representative who has indicated his/her understanding and acceptance.   Dental advisory given  Plan Discussed with: CRNA  Anesthesia Plan Comments:         Anesthesia Quick Evaluation

## 2015-08-22 ENCOUNTER — Inpatient Hospital Stay (HOSPITAL_COMMUNITY): Payer: 59 | Admitting: Anesthesiology

## 2015-08-22 ENCOUNTER — Inpatient Hospital Stay (HOSPITAL_COMMUNITY)
Admission: RE | Admit: 2015-08-22 | Discharge: 2015-08-23 | DRG: 468 | Disposition: A | Discharge status: Home Health | Payer: 59 | Source: Ambulatory Visit | Attending: Orthopedic Surgery | Admitting: Orthopedic Surgery

## 2015-08-22 ENCOUNTER — Encounter (HOSPITAL_COMMUNITY): Payer: Self-pay

## 2015-08-22 ENCOUNTER — Encounter (HOSPITAL_COMMUNITY)
Admission: RE | Disposition: A | Discharge status: Home Health | Payer: Self-pay | Source: Ambulatory Visit | Attending: Orthopedic Surgery

## 2015-08-22 DIAGNOSIS — T84092A Other mechanical complication of internal right knee prosthesis, initial encounter: Principal | ICD-10-CM | POA: Diagnosis present

## 2015-08-22 DIAGNOSIS — Z79899 Other long term (current) drug therapy: Secondary | ICD-10-CM | POA: Diagnosis not present

## 2015-08-22 DIAGNOSIS — T8484XA Pain due to internal orthopedic prosthetic devices, implants and grafts, initial encounter: Secondary | ICD-10-CM | POA: Diagnosis not present

## 2015-08-22 DIAGNOSIS — I10 Essential (primary) hypertension: Secondary | ICD-10-CM | POA: Diagnosis present

## 2015-08-22 DIAGNOSIS — T84022A Instability of internal right knee prosthesis, initial encounter: Secondary | ICD-10-CM | POA: Diagnosis not present

## 2015-08-22 DIAGNOSIS — Y838 Other surgical procedures as the cause of abnormal reaction of the patient, or of later complication, without mention of misadventure at the time of the procedure: Secondary | ICD-10-CM | POA: Diagnosis present

## 2015-08-22 DIAGNOSIS — Z6835 Body mass index (BMI) 35.0-35.9, adult: Secondary | ICD-10-CM

## 2015-08-22 DIAGNOSIS — Z96651 Presence of right artificial knee joint: Secondary | ICD-10-CM | POA: Diagnosis not present

## 2015-08-22 DIAGNOSIS — Z01812 Encounter for preprocedural laboratory examination: Secondary | ICD-10-CM | POA: Diagnosis not present

## 2015-08-22 DIAGNOSIS — K219 Gastro-esophageal reflux disease without esophagitis: Secondary | ICD-10-CM | POA: Diagnosis present

## 2015-08-22 DIAGNOSIS — Z8249 Family history of ischemic heart disease and other diseases of the circulatory system: Secondary | ICD-10-CM

## 2015-08-22 DIAGNOSIS — M1711 Unilateral primary osteoarthritis, right knee: Secondary | ICD-10-CM | POA: Diagnosis present

## 2015-08-22 DIAGNOSIS — M25561 Pain in right knee: Secondary | ICD-10-CM | POA: Diagnosis present

## 2015-08-22 DIAGNOSIS — Z96659 Presence of unspecified artificial knee joint: Secondary | ICD-10-CM

## 2015-08-22 DIAGNOSIS — E669 Obesity, unspecified: Secondary | ICD-10-CM | POA: Diagnosis present

## 2015-08-22 HISTORY — PX: CONVERSION TO TOTAL KNEE: SHX5785

## 2015-08-22 LAB — TYPE AND SCREEN
ABO/RH(D): A POS
Antibody Screen: NEGATIVE

## 2015-08-22 SURGERY — CONVERSION, ARTHROPLASTY, KNEE, PARTIAL, TO TOTAL KNEE ARTHROPLASTY
Anesthesia: Spinal | Site: Knee | Laterality: Right

## 2015-08-22 MED ORDER — METOCLOPRAMIDE HCL 10 MG PO TABS: 5.0000 mg | ORAL_TABLET | Freq: Three times a day (TID) | ORAL | Status: DC | PRN

## 2015-08-22 MED ORDER — PROPOFOL 10 MG/ML IV BOLUS
INTRAVENOUS | Status: AC
Start: 2015-08-22 — End: 2015-08-22
  Filled 2015-08-22: qty 20

## 2015-08-22 MED ORDER — BUPIVACAINE-EPINEPHRINE (PF) 0.25% -1:200000 IJ SOLN
INTRAMUSCULAR | Status: DC | PRN
  Administered 2015-08-22: 30 mL

## 2015-08-22 MED ORDER — METHOCARBAMOL 500 MG PO TABS
500.0000 mg | ORAL_TABLET | Freq: Four times a day (QID) | ORAL | Status: DC | PRN
  Administered 2015-08-23: 500 mg via ORAL
  Filled 2015-08-22: qty 1

## 2015-08-22 MED ORDER — POLYETHYLENE GLYCOL 3350 17 G PO PACK
17.0000 g | PACK | Freq: Two times a day (BID) | ORAL | Status: DC
  Administered 2015-08-22 – 2015-08-23 (×2): 17 g via ORAL

## 2015-08-22 MED ORDER — HYDROCODONE-ACETAMINOPHEN 7.5-325 MG PO TABS
1.0000 | ORAL_TABLET | ORAL | Status: DC
  Administered 2015-08-22 – 2015-08-23 (×6): 2 via ORAL
  Filled 2015-08-22 (×6): qty 2

## 2015-08-22 MED ORDER — SODIUM CHLORIDE 0.9 % IJ SOLN
INTRAMUSCULAR | Status: AC
  Filled 2015-08-22: qty 50

## 2015-08-22 MED ORDER — KETOROLAC TROMETHAMINE 30 MG/ML IJ SOLN
INTRAMUSCULAR | Status: DC | PRN
  Administered 2015-08-22: 30 mg

## 2015-08-22 MED ORDER — CEFAZOLIN SODIUM-DEXTROSE 2-3 GM-% IV SOLR
2.0000 g | INTRAVENOUS | Status: AC
  Administered 2015-08-22: 2 g via INTRAVENOUS

## 2015-08-22 MED ORDER — LORATADINE 10 MG PO TABS
10.0000 mg | ORAL_TABLET | Freq: Every day | ORAL | Status: DC
  Administered 2015-08-23: 10 mg via ORAL
  Filled 2015-08-22: qty 1

## 2015-08-22 MED ORDER — BUPROPION HCL ER (XL) 300 MG PO TB24
300.0000 mg | ORAL_TABLET | Freq: Every day | ORAL | Status: DC
  Administered 2015-08-23: 300 mg via ORAL
  Filled 2015-08-22: qty 1

## 2015-08-22 MED ORDER — METOCLOPRAMIDE HCL 5 MG/ML IJ SOLN: 5.0000 mg | Freq: Three times a day (TID) | INTRAMUSCULAR | Status: DC | PRN

## 2015-08-22 MED ORDER — ONDANSETRON HCL 4 MG/2ML IJ SOLN
INTRAMUSCULAR | Status: AC
  Filled 2015-08-22: qty 2

## 2015-08-22 MED ORDER — DOCUSATE SODIUM 100 MG PO CAPS
100.0000 mg | ORAL_CAPSULE | Freq: Two times a day (BID) | ORAL | Status: DC
  Administered 2015-08-22 – 2015-08-23 (×2): 100 mg via ORAL

## 2015-08-22 MED ORDER — CEFAZOLIN SODIUM-DEXTROSE 2-3 GM-% IV SOLR
2.0000 g | Freq: Four times a day (QID) | INTRAVENOUS | Status: AC
  Administered 2015-08-22 (×2): 2 g via INTRAVENOUS
  Filled 2015-08-22 (×2): qty 50

## 2015-08-22 MED ORDER — DEXAMETHASONE SODIUM PHOSPHATE 10 MG/ML IJ SOLN
10.0000 mg | Freq: Once | INTRAMUSCULAR | Status: AC
  Administered 2015-08-23: 10 mg via INTRAVENOUS
  Filled 2015-08-22: qty 1

## 2015-08-22 MED ORDER — CHLORHEXIDINE GLUCONATE 4 % EX LIQD: 60.0000 mL | Freq: Once | CUTANEOUS | Status: DC

## 2015-08-22 MED ORDER — KETOROLAC TROMETHAMINE 30 MG/ML IJ SOLN
INTRAMUSCULAR | Status: AC
  Filled 2015-08-22: qty 1

## 2015-08-22 MED ORDER — LACTATED RINGERS IV SOLN
INTRAVENOUS | Status: DC
  Administered 2015-08-22: 12:00:00 via INTRAVENOUS
  Administered 2015-08-22: 1000 mL via INTRAVENOUS
  Administered 2015-08-22: 11:00:00 via INTRAVENOUS

## 2015-08-22 MED ORDER — SODIUM CHLORIDE 0.9 % IJ SOLN
INTRAMUSCULAR | Status: DC | PRN
Start: 2015-08-22 — End: 2015-08-22
  Administered 2015-08-22: 30 mL

## 2015-08-22 MED ORDER — KETOROLAC TROMETHAMINE 15 MG/ML IJ SOLN
15.0000 mg | Freq: Four times a day (QID) | INTRAMUSCULAR | Status: DC
  Administered 2015-08-22 – 2015-08-23 (×4): 15 mg via INTRAVENOUS
  Filled 2015-08-22 (×8): qty 1

## 2015-08-22 MED ORDER — PROMETHAZINE HCL 25 MG/ML IJ SOLN: 6.2500 mg | INTRAMUSCULAR | Status: DC | PRN

## 2015-08-22 MED ORDER — FENTANYL CITRATE (PF) 100 MCG/2ML IJ SOLN
INTRAMUSCULAR | Status: DC | PRN
  Administered 2015-08-22: 100 ug via INTRAVENOUS

## 2015-08-22 MED ORDER — PROPOFOL 10 MG/ML IV BOLUS
INTRAVENOUS | Status: AC
  Filled 2015-08-22: qty 20

## 2015-08-22 MED ORDER — CEFAZOLIN SODIUM-DEXTROSE 2-3 GM-% IV SOLR
INTRAVENOUS | Status: AC
  Filled 2015-08-22: qty 50

## 2015-08-22 MED ORDER — DEXAMETHASONE SODIUM PHOSPHATE 10 MG/ML IJ SOLN
INTRAMUSCULAR | Status: AC
  Filled 2015-08-22: qty 1

## 2015-08-22 MED ORDER — MENTHOL 3 MG MT LOZG: 1.0000 | LOZENGE | OROMUCOSAL | Status: DC | PRN

## 2015-08-22 MED ORDER — LIDOCAINE HCL (CARDIAC) 20 MG/ML IV SOLN
INTRAVENOUS | Status: DC | PRN
  Administered 2015-08-22: 50 mg via INTRAVENOUS

## 2015-08-22 MED ORDER — ONDANSETRON HCL 4 MG PO TABS: 4.0000 mg | ORAL_TABLET | Freq: Four times a day (QID) | ORAL | Status: DC | PRN

## 2015-08-22 MED ORDER — MEPERIDINE HCL 50 MG/ML IJ SOLN: 6.2500 mg | INTRAMUSCULAR | Status: DC | PRN

## 2015-08-22 MED ORDER — FENTANYL CITRATE (PF) 100 MCG/2ML IJ SOLN
INTRAMUSCULAR | Status: AC
  Filled 2015-08-22: qty 2

## 2015-08-22 MED ORDER — MAGNESIUM CITRATE PO SOLN: 1.0000 | Freq: Once | ORAL | Status: DC | PRN

## 2015-08-22 MED ORDER — MIDAZOLAM HCL 5 MG/5ML IJ SOLN
INTRAMUSCULAR | Status: DC | PRN
  Administered 2015-08-22: 2 mg via INTRAVENOUS

## 2015-08-22 MED ORDER — ONDANSETRON HCL 4 MG/2ML IJ SOLN: 4.0000 mg | Freq: Four times a day (QID) | INTRAMUSCULAR | Status: DC | PRN

## 2015-08-22 MED ORDER — PHENOL 1.4 % MT LIQD: 1.0000 | OROMUCOSAL | Status: DC | PRN

## 2015-08-22 MED ORDER — ALUM & MAG HYDROXIDE-SIMETH 200-200-20 MG/5ML PO SUSP: 30.0000 mL | ORAL | Status: DC | PRN

## 2015-08-22 MED ORDER — FUROSEMIDE 20 MG PO TABS
20.0000 mg | ORAL_TABLET | Freq: Every day | ORAL | Status: DC
  Administered 2015-08-22 – 2015-08-23 (×2): 20 mg via ORAL
  Filled 2015-08-22 (×2): qty 1

## 2015-08-22 MED ORDER — SODIUM CHLORIDE 0.9 % IV SOLN
INTRAVENOUS | Status: DC
  Administered 2015-08-22 (×2): via INTRAVENOUS
  Filled 2015-08-22 (×4): qty 1000

## 2015-08-22 MED ORDER — PROPOFOL 500 MG/50ML IV EMUL
INTRAVENOUS | Status: DC | PRN
  Administered 2015-08-22: 50 ug/kg/min via INTRAVENOUS

## 2015-08-22 MED ORDER — FERROUS SULFATE 325 (65 FE) MG PO TABS
325.0000 mg | ORAL_TABLET | Freq: Three times a day (TID) | ORAL | Status: DC
  Filled 2015-08-22 (×5): qty 1

## 2015-08-22 MED ORDER — LIDOCAINE HCL (CARDIAC) 20 MG/ML IV SOLN
INTRAVENOUS | Status: AC
  Filled 2015-08-22: qty 5

## 2015-08-22 MED ORDER — METHOCARBAMOL 1000 MG/10ML IJ SOLN
500.0000 mg | Freq: Four times a day (QID) | INTRAVENOUS | Status: DC | PRN
  Administered 2015-08-22: 500 mg via INTRAVENOUS
  Filled 2015-08-22 (×2): qty 5

## 2015-08-22 MED ORDER — CELECOXIB 200 MG PO CAPS
200.0000 mg | ORAL_CAPSULE | Freq: Two times a day (BID) | ORAL | Status: DC
  Administered 2015-08-22: 200 mg via ORAL
  Filled 2015-08-22: qty 1

## 2015-08-22 MED ORDER — ASPIRIN EC 325 MG PO TBEC
325.0000 mg | DELAYED_RELEASE_TABLET | Freq: Two times a day (BID) | ORAL | Status: DC
  Administered 2015-08-23: 325 mg via ORAL
  Filled 2015-08-22 (×3): qty 1

## 2015-08-22 MED ORDER — DEXAMETHASONE SODIUM PHOSPHATE 10 MG/ML IJ SOLN
10.0000 mg | Freq: Once | INTRAMUSCULAR | Status: AC
  Administered 2015-08-22: 10 mg via INTRAVENOUS

## 2015-08-22 MED ORDER — TRANEXAMIC ACID 1000 MG/10ML IV SOLN
1000.0000 mg | Freq: Once | INTRAVENOUS | Status: AC
  Administered 2015-08-22: 1000 mg via INTRAVENOUS
  Filled 2015-08-22: qty 10

## 2015-08-22 MED ORDER — BISACODYL 10 MG RE SUPP: 10.0000 mg | Freq: Every day | RECTAL | Status: DC | PRN

## 2015-08-22 MED ORDER — DIPHENHYDRAMINE HCL 25 MG PO CAPS: 25.0000 mg | ORAL_CAPSULE | Freq: Four times a day (QID) | ORAL | Status: DC | PRN

## 2015-08-22 MED ORDER — BUPIVACAINE IN DEXTROSE 0.75-8.25 % IT SOLN
INTRATHECAL | Status: DC | PRN
  Administered 2015-08-22: 1.8 mL via INTRATHECAL

## 2015-08-22 MED ORDER — MIDAZOLAM HCL 2 MG/2ML IJ SOLN
INTRAMUSCULAR | Status: AC
  Filled 2015-08-22: qty 2

## 2015-08-22 MED ORDER — POTASSIUM CHLORIDE CRYS ER 10 MEQ PO TBCR
10.0000 meq | EXTENDED_RELEASE_TABLET | Freq: Two times a day (BID) | ORAL | Status: DC
  Administered 2015-08-23: 10 meq via ORAL
  Filled 2015-08-22 (×2): qty 1

## 2015-08-22 MED ORDER — HYDROMORPHONE HCL 1 MG/ML IJ SOLN
0.5000 mg | INTRAMUSCULAR | Status: DC | PRN
  Administered 2015-08-22: 1 mg via INTRAVENOUS
  Administered 2015-08-22: 0.5 mg via INTRAVENOUS
  Filled 2015-08-22 (×2): qty 1

## 2015-08-22 MED ORDER — FENTANYL CITRATE (PF) 100 MCG/2ML IJ SOLN
25.0000 ug | INTRAMUSCULAR | Status: DC | PRN
  Administered 2015-08-22 (×2): 50 ug via INTRAVENOUS

## 2015-08-22 MED ORDER — ONDANSETRON HCL 4 MG/2ML IJ SOLN
INTRAMUSCULAR | Status: DC | PRN
  Administered 2015-08-22: 4 mg via INTRAVENOUS

## 2015-08-22 MED ORDER — ALPRAZOLAM 0.25 MG PO TABS: 0.2500 mg | ORAL_TABLET | Freq: Two times a day (BID) | ORAL | Status: DC | PRN

## 2015-08-22 MED ORDER — BUPIVACAINE-EPINEPHRINE (PF) 0.25% -1:200000 IJ SOLN
INTRAMUSCULAR | Status: AC
  Filled 2015-08-22: qty 30

## 2015-08-22 SURGICAL SUPPLY — 56 items
BAG SPEC THK2 15X12 ZIP CLS (MISCELLANEOUS) ×1
BAG ZIPLOCK 12X15 (MISCELLANEOUS) ×3 IMPLANT
BANDAGE ACE 6X5 VEL STRL LF (GAUZE/BANDAGES/DRESSINGS) ×3 IMPLANT
BANDAGE ESMARK 6X9 LF (GAUZE/BANDAGES/DRESSINGS) ×1 IMPLANT
BLADE SAW SGTL 13.0X1.19X90.0M (BLADE) ×5 IMPLANT
BNDG CMPR 9X6 STRL LF SNTH (GAUZE/BANDAGES/DRESSINGS) ×1
BNDG ESMARK 6X9 LF (GAUZE/BANDAGES/DRESSINGS) ×3
BOWL SMART MIX CTS (DISPOSABLE) ×3 IMPLANT
CAPT KNEE TOTAL 3 ATTUNE ×2 IMPLANT
CEMENT HV SMART SET (Cement) ×4 IMPLANT
CUFF TOURN SGL QUICK 34 (TOURNIQUET CUFF) ×3
CUFF TRNQT CYL 34X4X40X1 (TOURNIQUET CUFF) ×1 IMPLANT
DECANTER SPIKE VIAL GLASS SM (MISCELLANEOUS) ×1 IMPLANT
DRAPE EXTREMITY T 121X128X90 (DRAPE) ×3 IMPLANT
DRAPE POUCH INSTRU U-SHP 10X18 (DRAPES) ×3 IMPLANT
DRAPE U-SHAPE 47X51 STRL (DRAPES) ×3 IMPLANT
DRSG AQUACEL AG ADV 3.5X10 (GAUZE/BANDAGES/DRESSINGS) ×3 IMPLANT
DRSG TEGADERM 4X4.75 (GAUZE/BANDAGES/DRESSINGS) ×1 IMPLANT
DURAPREP 26ML APPLICATOR (WOUND CARE) ×6 IMPLANT
ELECT REM PT RETURN 9FT ADLT (ELECTROSURGICAL) ×3
ELECTRODE REM PT RTRN 9FT ADLT (ELECTROSURGICAL) ×1 IMPLANT
FACESHIELD WRAPAROUND (MASK) ×15 IMPLANT
FACESHIELD WRAPAROUND OR TEAM (MASK) ×5 IMPLANT
GAUZE SPONGE 2X2 8PLY STRL LF (GAUZE/BANDAGES/DRESSINGS) ×1 IMPLANT
GLOVE BIOGEL M 7.0 STRL (GLOVE) IMPLANT
GLOVE BIOGEL PI IND STRL 7.5 (GLOVE) ×1 IMPLANT
GLOVE BIOGEL PI IND STRL 8.5 (GLOVE) ×1 IMPLANT
GLOVE BIOGEL PI INDICATOR 7.5 (GLOVE) ×2
GLOVE BIOGEL PI INDICATOR 8.5 (GLOVE) ×2
GLOVE ECLIPSE 8.0 STRL XLNG CF (GLOVE) ×3 IMPLANT
GLOVE ORTHO TXT STRL SZ7.5 (GLOVE) ×6 IMPLANT
GOWN STRL REUS W/TWL LRG LVL3 (GOWN DISPOSABLE) ×3 IMPLANT
GOWN STRL REUS W/TWL XL LVL3 (GOWN DISPOSABLE) ×3 IMPLANT
HANDPIECE INTERPULSE COAX TIP (DISPOSABLE) ×3
LIQUID BAND (GAUZE/BANDAGES/DRESSINGS) ×3 IMPLANT
MANIFOLD NEPTUNE II (INSTRUMENTS) ×3 IMPLANT
NDL SAFETY ECLIPSE 18X1.5 (NEEDLE) ×1 IMPLANT
NEEDLE HYPO 18GX1.5 SHARP (NEEDLE) ×3
NS IRRIG 1000ML POUR BTL (IV SOLUTION) ×4 IMPLANT
POSITIONER SURGICAL ARM (MISCELLANEOUS) ×3 IMPLANT
SET HNDPC FAN SPRY TIP SCT (DISPOSABLE) ×1 IMPLANT
SET PAD KNEE POSITIONER (MISCELLANEOUS) ×3 IMPLANT
SPONGE GAUZE 2X2 STER 10/PKG (GAUZE/BANDAGES/DRESSINGS) ×2
SUCTION FRAZIER 12FR DISP (SUCTIONS) ×3 IMPLANT
SUT MNCRL AB 4-0 PS2 18 (SUTURE) ×3 IMPLANT
SUT VIC AB 1 CT1 36 (SUTURE) ×3 IMPLANT
SUT VIC AB 2-0 CT1 27 (SUTURE) ×9
SUT VIC AB 2-0 CT1 TAPERPNT 27 (SUTURE) ×3 IMPLANT
SUT VLOC 180 0 24IN GS25 (SUTURE) ×3 IMPLANT
SYR 50ML LL SCALE MARK (SYRINGE) ×3 IMPLANT
TOWEL OR 17X26 10 PK STRL BLUE (TOWEL DISPOSABLE) ×4 IMPLANT
TRAY FOLEY W/METER SILVER 14FR (SET/KITS/TRAYS/PACK) ×1 IMPLANT
TRAY FOLEY W/METER SILVER 16FR (SET/KITS/TRAYS/PACK) ×3 IMPLANT
WATER STERILE IRR 1500ML POUR (IV SOLUTION) ×3 IMPLANT
WRAP KNEE MAXI GEL POST OP (GAUZE/BANDAGES/DRESSINGS) ×3 IMPLANT
YANKAUER SUCT BULB TIP 10FT TU (MISCELLANEOUS) ×2 IMPLANT

## 2015-08-22 NOTE — Op Note (Signed)
Jill Cook, Jill Cook             ACCOUNT NO.:  [id number]  MEDICAL RECORD NO.:  [id number]  LOCATION:  [facility]                         FACILITY:  [facility]  PHYSICIAN:  Pietro Cassis. Alvan Dame, M.D.  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  FINDINGS:  The patient is noted to have some erosive damage to the apex of the patella button.  In addition, there was noted to be progressive advanced degenerative changes of the lateral compartment of the knee with exposed bone.  PROCEDURE:  Conversion of failed right partial patellofemoral arthroplasty and total knee arthroplasty.  COMPONENTS USED:  A DePuy Attune knee system, with a size 4 standard femur, 4 tibia, 5 mm posterior stabilized AOX insert with a 35 anatomic patella button.  SURGEON:  Pietro Cassis. Alvan Dame, M.D.  ASSISTANT:  Jill Orleans, PA-C.  Note that [name] was present for the entirety of the case from preoperative position, perioperative management of the operative extremity, general facilitation of the case and primary wound closure.  ANESTHESIA:  Spinal.  SPECIMENS:  None.  COMPLICATIONS:  None.  DRAINS:  None.  TOURNIQUET TIME:  38 minutes at 250 mmHg.  INDICATION FOR PROCEDURE:  Jill Cook is a 50 year old female, referred for evaluation of her right knee, following partial patellofemoral arthroplasty.  She has had progressively worsening symptoms and at this point, was ready to proceed with more definitive measures.  She had gone through and failed conservative measures by previously treating surgeon and wished not to proceed with any further treatment other than surgery. Risks and benefits of conversion to a total  knee arthroplasty were discussed and reviewed including the potential findings with bone loss and the potential need for special types of implants in addition to standard risk of infection, DVT, component failure, need for future revision surgery, particularly given her age at 54.  Consent was obtained for benefit of pain relief.  PROCEDURE IN DETAIL:  The patient was brought to the operative theater. Once adequate anesthesia, preoperative antibiotics, 2 g of Ancef, 1 g of tranexamic acid, and 10 mg of Decadron administered, the patient was positioned supine with a right thigh tourniquet placed.  The right lower extremity was then prepped and draped in sterile fashion.  Time-out was performed, identifying the patient, planned procedure and extremity.  Right lower extremity was exsanguinated, placed into the [facility] leg holder.  Previous incision was identified and excised and extended slightly proximal distal for exposure in the revision setting.  Soft tissue planes created.  A median arthrotomy was then made.  We encountered clear yellow synovial fluid without evidence of any concerns for infection.  Following initial exposure, including recreating medial and lateral gutters, attention was first directed to the patella.  We everted the patella, and significant patella damage to the apex of the patella.  I also measured the patella and felt that the patella height was about 26 mm, combined with the damage of the patella.  It was determined  the need to be revised.  Using an oscillating saw.  The patella button was removed.  I then used a drill bit to remove the polyethylene pegs.  I then used a 35 patellar button to restore height.  The lug holes were drilled for this.  Metal Shim was placed in the patella, tracked from retractors.  At this point, the knee was flexed and attention was directed to the removing the femoral component.  Using an osteotome distally initially just free  of bone and soft tissue junction.  I used an oscillating saw to undermine the medial and lateral aspects of the shield carefully to prevent any further bone loss.  Then using osteotomes, we were able to tap the femoral component off without bone loss.  Attention was now directed performing total knee arthroplasty.  The distal femur was opened.  The drill irrigated to try to prevent fat emboli.  The distal femoral cutting block was positioned with an intramedullary guide at 3 degrees of valgus, removing 9 mm of bone off the distal femur.  Following this resection, the tibia was subluxated anteriorly.  Following further meniscectomy and cruciate removal, using an extramedullary guide and resected 3 mm of bone off the proximal medial tibia, due the fact there was intact cartilage surface here.  Following this resection, we checked the extension gap and found that with a 5 mm insert, the knee came to full extension, although the pins with extramedullary guide removed at this point.  At this point, I checked the cut surface of the proximal tibia to confirm it was parallel to the tibial spine confirming a perpendicular cut.  Once I was satisfied here, we then sized the femur.  The femur was sized to be a size 4.  The size 4 rotation block was then pinned into position using the C-clamp off the cut surface of the proximal tibia. Once it was pinned, we exchanged this for the 401 cutting block.  The anterior, posterior, and chamfer cuts were then made without difficulty nor notching along the anterior cortex of the femur.  Final box cut was made off the lateral aspect of the femur using a size 4 component.  At this point, the tibia was subluxated at it anteriorly again.  Final preparation of the tibia was carried out with a size 4 tibial tray, pinned into position through the medial third of the tubercle.  It was drilled and keel punched.  Trial reduction was now carried out with a 4 femur, 4  tibia, and a 5 mm insert.  The knee came to full extension and flexed, and the patella tracked through the trochlea without application of pressure and ligaments were stable from extension to flexion.  Given these findings, the trial components removed.  Final debridement was carried out as necessary.  This cement was mixed on the back table. Final components were more open.  The synovial capsule junction injected with 30 mL of 0.25% Marcaine with epinephrine 1 mL of Toradol and 30 mL of normal saline.  Once the cement was ready, the final components were cemented into place.  The knee was brought to extension with a 5 mm insert.  Once the cement had fully cured, the excess cement was removed throughout the knee.  The final 5 mm insert was selected based on repeat trial reduction.  The final 5 insert was removed.  Tourniquet had been let down without significant hemostasis required.  The final insert was placed in the knee.  The  knee was re-irrigated with normal saline solution.  The knee was then brought to flexion and the capsule reapproximated with a combination of #1 Vicryl and 0 V-Loc sutures.  The remainder of the wound was a closed with 2-0 Vicryl and running 4-0 Monocryl.  The knee was clean, dried, and dressed sterilely, using surgical glue and Aquacel dressing.  The knee was wrapped in Ace wrap.  She was then brought to the recovery room in stable condition, tolerating the procedure well.  Findings reviewed with family.  See her back in office for routine follow up 2 weeks.  Physical therapy will be based on the patient's accessibility to the home therapy versus outpatient therapy.     Pietro Cassis Alvan Dame, M.D.     MDO/MEDQ  D:  08/22/2015  T:  08/22/2015  Job:  [id number]

## 2015-08-22 NOTE — Anesthesia Procedure Notes (Signed)
Spinal Patient location during procedure: OR End time: 08/22/2015 9:46 AM Staffing Resident/CRNA: Noralyn Pick D Performed by: anesthesiologist and resident/CRNA  Preanesthetic Checklist Completed: patient identified, site marked, surgical consent, pre-op evaluation, timeout performed, IV checked, risks and benefits discussed and monitors and equipment checked Spinal Block Patient position: sitting Prep: Betadine Patient monitoring: heart rate, continuous pulse ox and blood pressure Approach: midline Location: L3-4 Injection technique: single-shot Needle Needle type: Sprotte  Needle gauge: 24 G Needle length: 9 cm Assessment Sensory level: T6 Additional Notes Expiration date of kit checked and confirmed. Patient tolerated procedure well, without complications.

## 2015-08-22 NOTE — Discharge Instructions (Signed)

## 2015-08-22 NOTE — Interval H&P Note (Signed)
History and Physical Interval Note:  08/22/2015 8:53 AM  Jill Cook  has presented today for surgery, with the diagnosis of PAINFUL RIGHT UNI-KNEE  The various methods of treatment have been discussed with the patient and family. After consideration of risks, benefits and other options for treatment, the patient has consented to  Procedure(s): CONVERSION TO RIGHT TOTAL KNEE (Right) as a surgical intervention .  The patient's history has been reviewed, patient examined, no change in status, stable for surgery.  I have reviewed the patient's chart and labs.  Questions were answered to the patient's satisfaction.     Mauri Pole

## 2015-08-22 NOTE — Anesthesia Postprocedure Evaluation (Signed)
Anesthesia Post Note  Patient: Jill Cook  Procedure(s) Performed: Procedure(s) (LRB): partial knee CONVERSION TO RIGHT TOTAL KNEE (Right)  Patient location during evaluation: PACU Anesthesia Type: Spinal Level of consciousness: oriented and awake and alert Pain management: pain level controlled Vital Signs Assessment: post-procedure vital signs reviewed and stable Respiratory status: spontaneous breathing, respiratory function stable and patient connected to nasal cannula oxygen Cardiovascular status: blood pressure returned to baseline and stable Postop Assessment: no headache and no backache Anesthetic complications: no    Last Vitals:  Filed Vitals:   08/22/15 1200 08/22/15 1215  BP: 102/64 118/67  Pulse: 67 89  Temp:    Resp: 12 16    Last Pain: There were no vitals filed for this visit.  LLE Motor Response: Responds to commands (08/22/15 1215) LLE Sensation: Decreased (due to spinal) (08/22/15 1215) RLE Motor Response: Responds to commands (08/22/15 1215) RLE Sensation: Decreased (due to spinal) (08/22/15 1215) L Sensory Level: S1-Sole of foot, small toes (08/22/15 1215) R Sensory Level: S1-Sole of foot, small toes (08/22/15 1215)  Donalda Job

## 2015-08-22 NOTE — Brief Op Note (Signed)
08/22/2015  11:29 AM  PATIENT:  Jill Cook  50 y.o. female  PRE-OPERATIVE DIAGNOSIS:  PAINFUL RIGHT partial patellofemoral knee arthroplasty  POST-OPERATIVE DIAGNOSIS:   PAINFUL RIGHT partial patellofemoral knee arthroplasty  PROCEDURE:  Procedure(s): partial knee CONVERSION TO RIGHT TOTAL KNEE (Right), Attune  SURGEON:  Surgeon(s) and Role:    * Paralee Cancel, MD - Primary  PHYSICIAN ASSISTANT: Danae Orleans, PA-C  ANESTHESIA:   spinal  EBL:  Total I/O In: 1000 [I.V.:1000] Out: 250 [Urine:250]  BLOOD ADMINISTERED:none  DRAINS: none   LOCAL MEDICATIONS USED:  MARCAINE     SPECIMEN:  No Specimen  DISPOSITION OF SPECIMEN:  N/A  COUNTS:  YES  TOURNIQUET:   Total Tourniquet Time Documented: Thigh (Right) - 38 minutes Total: Thigh (Right) - 38 minutes   DICTATION: .Other Dictation: Dictation Number [id number]  PLAN OF CARE: Admit to inpatient   PATIENT DISPOSITION:  PACU - hemodynamically stable.   Delay start of Pharmacological VTE agent (>24hrs) due to surgical blood loss or risk of bleeding: no

## 2015-08-22 NOTE — Progress Notes (Signed)
Utilization review completed.  

## 2015-08-22 NOTE — Progress Notes (Signed)
PT Cancellation Note  Patient Details Name: Jill Cook MRN: [id number] DOB: Jun 11, 1966   Cancelled Treatment:    Reason Eval/Treat Not Completed: Patient not medically ready   Marcelino Freestone PT [id number]  08/22/2015, 6:38 PM

## 2015-08-22 NOTE — Transfer of Care (Signed)
Immediate Anesthesia Transfer of Care Note  Patient: Jill Cook  Procedure(s) Performed: Procedure(s): partial knee CONVERSION TO RIGHT TOTAL KNEE (Right)  Patient Location: PACU  Anesthesia Type:Regional  Level of Consciousness: awake, alert  and oriented  Airway & Oxygen Therapy: Patient Spontanous Breathing and Patient connected to face mask oxygen  Post-op Assessment: Report given to RN and Post -op Vital signs reviewed and stable  Post vital signs: Reviewed and stable  Last Vitals:  Filed Vitals:   08/22/15 0806  BP: 122/83  Pulse: 79  Temp: 36.6 C  Resp: 18    Complications: No apparent anesthesia complications

## 2015-08-23 DIAGNOSIS — E669 Obesity, unspecified: Secondary | ICD-10-CM | POA: Diagnosis present

## 2015-08-23 LAB — BASIC METABOLIC PANEL
ANION GAP: 7 (ref 5–15)
BUN: 12 mg/dL (ref 6–20)
CALCIUM: 8.9 mg/dL (ref 8.9–10.3)
CO2: 28 mmol/L (ref 22–32)
Chloride: 106 mmol/L (ref 101–111)
Creatinine, Ser: 1.06 mg/dL — ABNORMAL HIGH (ref 0.44–1.00)
GFR calc Af Amer: >60 mL/min (ref >60)
GLUCOSE: 152 mg/dL — AB (ref 65–99)
Potassium: 4.3 mmol/L (ref 3.5–5.1)
Sodium: 141 mmol/L (ref 135–145)

## 2015-08-23 LAB — CBC
HCT: 31.7 % — ABNORMAL LOW (ref 36.0–46.0)
Hemoglobin: 10.5 g/dL — ABNORMAL LOW (ref 12.0–15.0)
MCH: 29.2 pg (ref 26.0–34.0)
MCHC: 33.1 g/dL (ref 30.0–36.0)
MCV: 88.3 fL (ref 78.0–100.0)
PLATELETS: 160 10*3/uL (ref 150–400)
RBC: 3.59 MIL/uL — ABNORMAL LOW (ref 3.87–5.11)
RDW: 13.2 % (ref 11.5–15.5)
WBC: 9 10*3/uL (ref 4.0–10.5)

## 2015-08-23 MED ORDER — FERROUS SULFATE 325 (65 FE) MG PO TABS: 325.0000 mg | ORAL_TABLET | Freq: Three times a day (TID) | ORAL | Status: DC

## 2015-08-23 MED ORDER — ASPIRIN 325 MG PO TBEC: 325.0000 mg | DELAYED_RELEASE_TABLET | Freq: Two times a day (BID) | ORAL | Status: AC

## 2015-08-23 MED ORDER — HYDROCODONE-ACETAMINOPHEN 7.5-325 MG PO TABS: 1.0000 | ORAL_TABLET | ORAL | Status: DC | PRN

## 2015-08-23 MED ORDER — METHOCARBAMOL 500 MG PO TABS: 500.0000 mg | ORAL_TABLET | Freq: Four times a day (QID) | ORAL | Status: DC | PRN

## 2015-08-23 MED ORDER — POLYETHYLENE GLYCOL 3350 17 G PO PACK: 17.0000 g | PACK | Freq: Two times a day (BID) | ORAL | Status: DC

## 2015-08-23 MED ORDER — DOCUSATE SODIUM 100 MG PO CAPS: 100.0000 mg | ORAL_CAPSULE | Freq: Two times a day (BID) | ORAL | Status: DC

## 2015-08-23 MED FILL — HYDROCODON-APAP 7.5-325: 7.5-325 | 8 days supply | Qty: 100 | Fill #0

## 2015-08-23 MED FILL — METHOCARBAMOL 500 MG TABLET: 500 | 10 days supply | Qty: 40 | Fill #0

## 2015-08-23 NOTE — Discharge Summary (Signed)
Physician Discharge Summary  Patient ID: Jill Cook MRN: [id number] DOB/AGE: 1965/08/17 50 y.o.  Admit date: 08/22/2015 Discharge date: 08/23/2015   Procedures:  Procedure(s) (LRB): partial knee CONVERSION TO RIGHT TOTAL KNEE (Right)  Attending Physician:  Dr. Paralee Cancel   Admission Diagnoses:   Right knee pain S/P UKR   Discharge Diagnoses:  Principal Problem:   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 Active Problems:   Obese  Past Medical History  Diagnosis Date  . Hypertension   . Diverticulosis   . History of esophagitis   . OA (osteoarthritis) of knee     RIGHT  . Arthritis     RIGHT HIP AND WRIST  . Anal fissure   . Anemia   . GERD (gastroesophageal reflux disease)     OTC PRN  . Anxiety     HPI:    Jill Cook, 50 y.o. female, has a history of pain and functional disability in the right knee(s) due to failed previous arthroplasty and patient has failed non-surgical conservative treatments for greater than 12 weeks to include NSAID's and/or analgesics, corticosteriod injections, viscosupplementation injections and activity modification. The indications for the revision of the total knee arthroplasty are progressive or substantial perporsthetic bone loss. Onset of symptoms was gradual starting 2+ years ago with gradually worsening course since that time. Prior procedures on the right knee(s) include unicompartmental arthroplasty per Dr. Theda Sers in 2009. Patient currently rates pain in the right knee(s) at 8 out of 10 with activity. There is worsening of pain with activity and weight bearing, pain that interferes with activities of daily living, pain with passive range of motion, crepitus and joint swelling. Patient has evidence of previous UKR by imaging studies. This condition presents safety issues increasing the risk of falls. There is no current active infection. Risks, benefits and expectations were discussed with the patient. Risks including but  not limited to the risk of anesthesia, blood clots, nerve damage, blood vessel damage, failure of the prosthesis, infection and up to and including death. Patient understand the risks, benefits and expectations and wishes to proceed with surgery.    PCP: Odette Fraction, MD   Discharged Condition: good  Hospital Course:  Patient underwent the above stated procedure on 08/22/2015. Patient tolerated the procedure well and brought to the recovery room in good condition and subsequently to the floor.  POD #1 BP: 103/57 ; Pulse: 68 ; Temp: 97.6 F (36.4 C) ; Resp: 17 Patient reports pain as mild, pain controlled. No events throughout the night. Looking forward to recovery. Ready to be discharged home. Dorsiflexion/plantar flexion intact, incision: dressing C/D/I, no cellulitis present and compartment soft.   LABS  Basename    HGB     10.5  HCT     31.7    Discharge Exam: General appearance: alert, cooperative and no distress Extremities: Homans sign is negative, no sign of DVT, no edema, redness or tenderness in the calves or thighs and no ulcers, gangrene or trophic changes  Disposition: Home with follow up in 2 weeks   Follow-up Information    Follow up with Mauri Pole, MD. Schedule an appointment as soon as possible for a visit in 2 weeks.   Specialty:  Orthopedic Surgery   Contact information:   [address] [place] [address] [phone]       Follow up with [facility].   Why:  home health physical therapy   Contact information:   [address]  [place] [address] [phone]           Discharge Instructions    Call MD / Call 911    Complete by:  As directed   If you experience chest pain or shortness of breath, CALL 911 and be transported to the hospital emergency room.  If you develope a fever above 101 F, pus (white drainage) or increased drainage or redness at the wound, or calf pain, call your surgeon's  office.     Change dressing    Complete by:  As directed   Maintain surgical dressing until follow up in the clinic. If the edges start to pull up, may reinforce with tape. If the dressing is no longer working, may remove and cover with gauze and tape, but must keep the area dry and clean.  Call with any questions or concerns.     Constipation Prevention    Complete by:  As directed   Drink plenty of fluids.  Prune juice may be helpful.  You may use a stool softener, such as Colace (over the counter) 100 mg twice a day.  Use MiraLax (over the counter) for constipation as needed.     Diet - low sodium heart healthy    Complete by:  As directed      Discharge instructions    Complete by:  As directed   Maintain surgical dressing until follow up in the clinic. If the edges start to pull up, may reinforce with tape. If the dressing is no longer working, may remove and cover with gauze and tape, but must keep the area dry and clean.  Follow up in 2 weeks at [facility]. Call with any questions or concerns.     Increase activity slowly as tolerated    Complete by:  As directed   Weight bearing as tolerated with assist device (walker, cane, etc) as directed, use it as long as suggested by your surgeon or therapist, typically at least 4-6 weeks.     TED hose    Complete by:  As directed   Use stockings (TED hose) for 2 weeks on both leg(s).  You may remove them at night for sleeping.             Medication List    TAKE these medications        ALPRAZolam 0.25 MG tablet  Commonly known as:  XANAX  Take 1 tablet (0.25 mg total) by mouth 2 (two) times daily as needed for anxiety.     aspirin 325 MG EC tablet  Take 1 tablet (325 mg total) by mouth 2 (two) times daily.     buPROPion 300 MG 24 hr tablet  Commonly known as:  WELLBUTRIN XL  Take 1 tablet (300 mg total) by mouth daily.     Calcium-Vitamin D 600-400 MG-UNIT Tabs  Take 1 tablet by mouth daily.     cetirizine 10 MG  tablet  Commonly known as:  ZYRTEC  Take 10 mg by mouth daily.     docusate sodium 100 MG capsule  Commonly known as:  COLACE  Take 1 capsule (100 mg total) by mouth 2 (two) times daily.     ferrous sulfate 325 (65 FE) MG tablet  Take 1 tablet (325 mg total) by mouth 3 (three) times daily after meals.     furosemide 20 MG tablet  Commonly known as:  LASIX  Take 1 tablet (20 mg total) by mouth daily.  GLUCOSAMINE-CHONDROITIN PO  Take 1 capsule by mouth daily.     HYDROcodone-acetaminophen 7.5-325 MG tablet  Commonly known as:  NORCO  Take 1-2 tablets by mouth every 4 (four) hours as needed for moderate pain.     Linaclotide 145 MCG Caps capsule  Commonly known as:  LINZESS  Take 1 capsule (145 mcg total) by mouth daily.     lisinopril 10 MG tablet  Commonly known as:  PRINIVIL,ZESTRIL  TAKE ONE TABLET BY MOUTH ONE TIME DAILY     methocarbamol 500 MG tablet  Commonly known as:  ROBAXIN  Take 1 tablet (500 mg total) by mouth every 6 (six) hours as needed for muscle spasms.     polyethylene glycol packet  Commonly known as:  MIRALAX / GLYCOLAX  Take 17 g by mouth 2 (two) times daily.     potassium chloride 10 MEQ tablet  Commonly known as:  K-DUR,KLOR-CON  Take 1 tablet (10 mEq total) by mouth 2 (two) times daily.         Signed: [place] Pugh. Deja Kaigler   PA-C  08/23/2015, 2:14 PM

## 2015-08-23 NOTE — Progress Notes (Signed)
Occupational Therapy Evaluation Patient Details Name: DEMIE DERUYTER MRN: [id number] DOB: [birth year] Today's Date: 08/23/2015    History of Present Illness s/p conversion R UKR to TKA    Clinical Impression   Patient presents to OT with decreased ADL independence s/p R TKA. All education completed and patient has family assistance at home. No further OT needs. OT will sign off.    Follow Up Recommendations  No OT follow up;Supervision - Intermittent    Equipment Recommendations  None recommended by OT    Recommendations for Other Services       Precautions / Restrictions Precautions Precautions: Knee Restrictions Weight Bearing Restrictions: No Other Position/Activity Restrictions: WBAT      Mobility Bed Mobility              Transfers Overall transfer level: Needs assistance Equipment used: Rolling walker (2 wheeled) Transfers: Sit to/from Stand Sit to Stand: Supervision;Modified independent (Device/Increase time)         General transfer comment: cues for hand placement    Balance                                            ADL Overall ADL's : Needs assistance/impaired Eating/Feeding: Independent   Grooming: Wash/dry hands;Supervision/safety;Standing   Upper Body Bathing: Standing;Supervision/ safety   Lower Body Bathing: Minimal assistance;Sit to/from stand   Upper Body Dressing : Sitting;Set up   Lower Body Dressing: Minimal assistance;Sit to/from stand   Toilet Transfer: Supervision/safety;Ambulation;BSC;RW   Toileting- Clothing Manipulation and Hygiene: Supervision/safety;Sit to/from stand       Functional mobility during ADLs: Supervision/safety;Rolling walker General ADL Comments: Patient up in recliner on arrival. Husband present in room. Agreeable to OT session. Reports she is going home today. Educated on LB dressing tecniques. She has good flexibility to feet and demonstrated ability to adjust socks from  seated position bending forward. Patient supervision stand from recliner and ambulate to bathroom with RW. Toilet transfer and toileting supervision. While in bathroom, educated patient and husband on tub transfer technique with walker. They verbalized understanding. Stood at sink with supervision to wash and dry hands. Ambulated back to chair with RW and supervision. Positioned comfortably in chair. No further OT needs identified.      Vision     Perception     Praxis      Pertinent Vitals/Pain Pain Assessment: 0-10 Pain Score: 6  Pain Location: R knee Pain Descriptors / Indicators: Aching;Sore Pain Intervention(s): Limited activity within patient's tolerance;Monitored during session;Premedicated before session     Hand Dominance Right   Extremity/Trunk Assessment Upper Extremity Assessment Upper Extremity Assessment: Overall WFL for tasks assessed   Lower Extremity Assessment Lower Extremity Assessment: Defer to PT evaluation        Communication Communication Communication: No difficulties   Cognition Arousal/Alertness: Awake/alert Behavior During Therapy: WFL for tasks assessed/performed Overall Cognitive Status: Within Functional Limits for tasks assessed                     General Comments       Exercises       Shoulder Instructions      Home Living Family/patient expects to be discharged to:: Private residence Living Arrangements: Spouse/significant other Available Help at Discharge: Family;Available PRN/intermittently Type of Home: House Home Access: Stairs to enter [employer] of Steps: 3  Entrance Stairs-Rails: None Home Layout: Able  to live on main level with bedroom/bathroom     Bathroom Shower/Tub: Teacher, Larnell Granlund years/pre: Standard Bathroom Accessibility: Yes How Accessible: Accessible via walker Home Equipment: [place] - 2 wheels;Bedside commode          Prior Functioning/Environment Level of Independence:  Independent             OT Diagnosis: Acute pain   OT Problem List: Decreased range of motion;Decreased activity tolerance;Decreased knowledge of use of DME or AE;Pain   OT Treatment/Interventions:      OT Goals(Current goals can be found in the care plan section) Acute Rehab OT Goals Patient Stated Goal: get back to work OT Goal Formulation: All assessment and education complete, DC therapy  OT Frequency:     Barriers to D/C:            Co-evaluation              End of Session Equipment Utilized During Treatment: Rolling walker  Activity Tolerance: Patient tolerated treatment well Patient left: in chair;with call bell/phone within reach;with family/visitor present   Time: [id number] OT Time Calculation (min): 16 min Charges:  OT General Charges $OT Visit: 1 Procedure OT Evaluation $OT Eval Low Complexity: 1 Procedure G-Codes:    Mariachristina Holle A 09-20-2015, 1:04 PM

## 2015-08-23 NOTE — Progress Notes (Addendum)
     Subjective: 1 Day Post-Op Procedure(s) (LRB): partial knee CONVERSION TO RIGHT TOTAL KNEE (Right)   Patient reports pain as mild, pain controlled.  No events throughout the night. Looking forward to recovery.  Ready to be discharged home.  Objective:   VITALS:    08/23/15 0650  BP: 103/57  Pulse: 68  Temp: 97.6 F (36.4 C)  Resp: 17    Dorsiflexion/Plantar flexion intact Incision: dressing C/D/I No cellulitis present Compartment soft  LABS  Recent Labs  08/23/15 0441  HGB 10.5*  HCT 31.7*  WBC 9.0  PLT 160     Recent Labs  08/23/15 0441  NA 141  K 4.3  BUN 12  CREATININE 1.06*  GLUCOSE 152*     Assessment/Plan: 1 Day Post-Op Procedure(s) (LRB): partial knee CONVERSION TO RIGHT TOTAL KNEE (Right) Foley cath d/c'ed Advance diet Up with therapy D/C IV fluids Discharge home with home health  Follow up in 2 weeks at [facility]. Follow up with OLIN,Zarea Diesing D in 2 weeks.  Contact information:  [facility] [address], [place] [id number]    Obese (BMI 30-39.9) Estimated body mass index is 35.2 kg/(m^2) as calculated from the following:   Height as of this encounter: 5\' 6"  (1.676 m).   Weight as of this encounter: 98.884 kg (218 lb). Patient also counseled that weight may inhibit the healing process Patient counseled that losing weight will help with future health issues         [place] Pugh. Berenize Gatlin   PAC  08/23/2015, 2:10 PM

## 2015-08-23 NOTE — Progress Notes (Signed)
   08/23/15 1200  PT Visit Information  Last PT Received On 08/23/15  Assistance Needed +1  History of Present Illness s/p conversion R UKR to TKA   PT Time Calculation  PT Start Time (ACUTE ONLY) 1202  PT Stop Time (ACUTE ONLY) 1216  PT Time Calculation (min) (ACUTE ONLY) 14 min  Subjective Data  Patient Stated Goal get back to work  Precautions  Precautions Knee  Restrictions  Weight Bearing Restrictions No  Other Position/Activity Restrictions WBAT  Pain Assessment  Pain Assessment 0-10  Pain Score 6  Pain Location R knee  Pain Descriptors / Indicators Discomfort;Sore;Grimacing  Pain Intervention(s) Limited activity within patient's tolerance;Monitored during session;Premedicated before session  Cognition  Arousal/Alertness Awake/alert  Behavior During Therapy WFL for tasks assessed/performed  Overall Cognitive Status Within Functional Limits for tasks assessed  Bed Mobility  General bed mobility comments in chair  Transfers  Overall transfer level Needs assistance  Equipment used Rolling walker (2 wheeled)  Transfers Sit to/from Stand  Sit to Stand Supervision;Modified independent (Device/Increase time)  General transfer comment cues for hand placement  Ambulation/Gait  Ambulation/Gait assistance Supervision;Modified independent (Device/Increase time)  Ambulation Distance (Feet) 30 Feet  Assistive device Rolling walker (2 wheeled)  Gait Pattern/deviations Step-to pattern;Step-through pattern;Decreased stride length  General Gait Details good carryover from previous session  Stairs Yes  Stairs assistance Min guard  Stair Management Step to pattern;With walker;Backwards  Number of Stairs 3  General stair comments cues for sequence  Total Joint Exercises  Ankle Circles/Pumps (reviewed handout but deferred further ex d/t pain)  PT - End of Session  Activity Tolerance Patient tolerated treatment well  Patient left in chair;with call bell/phone within reach  Nurse  Communication Mobility status  PT - Assessment/Plan  PT Plan Current plan remains appropriate  Follow Up Recommendations Home health PT  PT equipment None recommended by PT  PT Goal Progression  Progress towards PT goals Progressing toward goals  Acute Rehab PT Goals  PT Goal Formulation With patient  Time For Goal Achievement 08/25/15  Potential to Achieve Goals Good  PT General Charges  $$ ACUTE PT VISIT 1 Procedure  PT Treatments  $Gait Training 8-22 mins

## 2015-08-23 NOTE — Evaluation (Signed)
Physical Therapy Evaluation Patient Details Name: Jill Cook MRN: [id number] DOB: November 11, 1965 Today's Date: 08/23/2015   History of Present Illness  s/p conversion R UKR to TKA   Clinical Impression  Pt is s/p R TKA resulting in the deficits listed below (see PT Problem List). Pt will benefit from skilled PT to increase their independence and safety with mobility to allow discharge to the venue listed below.      Follow Up Recommendations Home health PT    Equipment Recommendations  None recommended by PT    Recommendations for Other Services       Precautions / Restrictions Precautions Precautions: Knee Restrictions Weight Bearing Restrictions: No Other Position/Activity Restrictions: WBAT      Mobility  Bed Mobility Overal bed mobility: Needs Assistance Bed Mobility: Supine to Sit     Supine to sit: Min guard     General bed mobility comments: light assist with RLE  Transfers Overall transfer level: Needs assistance Equipment used: Rolling walker (2 wheeled) Transfers: Sit to/from Stand Sit to Stand: Supervision         General transfer comment: cues for hand placement  Ambulation/Gait Ambulation/Gait assistance: Min guard Ambulation Distance (Feet): 80 Feet Assistive device: Rolling walker (2 wheeled) Gait Pattern/deviations: Step-to pattern     General Gait Details: cues initially for sequence  Stairs            Wheelchair Mobility    Modified Rankin (Stroke Patients Only)       Balance                                             Pertinent Vitals/Pain Pain Assessment: 0-10 Pain Score: 2  Pain Location: R knee Pain Descriptors / Indicators: Sore Pain Intervention(s): Limited activity within patient's tolerance;Monitored during session;Premedicated before session;Repositioned    Home Living Family/patient expects to be discharged to:: Private residence Living Arrangements: Spouse/significant other    Type of Home: House Home Access: Stairs to enter Entrance Stairs-Rails: None Entrance Stairs-Number of Steps: 3  Home Layout: Able to live on main level with bedroom/bathroom Home Equipment: Walker - 2 wheels;Bedside commode      Prior Function Level of Independence: Independent               Hand Dominance        Extremity/Trunk Assessment   Upper Extremity Assessment: Defer to OT evaluation           Lower Extremity Assessment: RLE deficits/detail RLE Deficits / Details: A/AROM R knee flexion 5-65*       Communication   Communication: No difficulties  Cognition Arousal/Alertness: Awake/alert Behavior During Therapy: WFL for tasks assessed/performed Overall Cognitive Status: Within Functional Limits for tasks assessed                      General Comments      Exercises Total Joint Exercises Ankle Circles/Pumps: AROM;Both;10 reps Quad Sets: 10 reps;Both;AROM;Strengthening Heel Slides: AROM;AAROM;Right;10 reps      Assessment/Plan    PT Assessment Patient needs continued PT services  PT Diagnosis Difficulty walking   PT Problem List Decreased strength;Decreased range of motion;Decreased activity tolerance;Decreased mobility;Pain  PT Treatment Interventions DME instruction;Gait training;Functional mobility training;Therapeutic activities;Therapeutic exercise;Stair training   PT Goals (Current goals can be found in the Care Plan section) Acute Rehab PT Goals Patient Stated Goal: get back  to work PT Goal Formulation: With patient Time For Goal Achievement: 08/25/15 Potential to Achieve Goals: Good    Frequency 7X/week   Barriers to discharge        Co-evaluation               End of Session   Activity Tolerance: Patient tolerated treatment well Patient left: in chair;with call bell/phone within reach Nurse Communication: Mobility status         Time: 0945-1010 PT Time Calculation (min) (ACUTE ONLY): 25 min   Charges:    PT Evaluation $PT Eval Low Complexity: 1 Procedure PT Treatments $Gait Training: 8-22 mins   PT G Codes:        Iyari Hagner 09/03/15, 11:50 AM

## 2015-08-23 NOTE — Care Management Note (Signed)
Case Management Note  Patient Details  Name: Jill Cook MRN: [id number] Date of Birth: 25-Jun-1966  Subjective/Objective:                  partial knee CONVERSION TO RIGHT TOTAL KNEE (Right) Action/Plan: Discharge planning Expected Discharge Date:   08/23/15              Expected Discharge Plan:  [place]  In-House Referral:     Discharge planning Services  CM Consult  Post Acute Care Choice:    Choice offered to:  Patient  DME Arranged:  N/A DME Agency:     HH Arranged:  PT HH Agency:  [place]  Status of Service:  Completed, signed off  Medicare Important Message Given:    Date Medicare IM Given:    Medicare IM give by:    Date Additional Medicare IM Given:    Additional Medicare Important Message give by:     If discussed at [place] of Stay Meetings, dates discussed:    Additional Comments: CM met with pt in room to offer choice of home health agency.  Pt chooses [facility] to render HHPT.  Referral given to [facility] rep, Tim (on unit).  Pt has both a rolling walker and 3n1 at home.  No other CM needs were communicated. Dellie Catholic, RN 08/23/2015, 10:39 AM

## 2015-08-24 DIAGNOSIS — E669 Obesity, unspecified: Secondary | ICD-10-CM | POA: Diagnosis not present

## 2015-08-24 DIAGNOSIS — F411 Generalized anxiety disorder: Secondary | ICD-10-CM | POA: Diagnosis not present

## 2015-08-24 DIAGNOSIS — I10 Essential (primary) hypertension: Secondary | ICD-10-CM | POA: Diagnosis not present

## 2015-08-24 DIAGNOSIS — Z6834 Body mass index (BMI) 34.0-34.9, adult: Secondary | ICD-10-CM | POA: Diagnosis not present

## 2015-08-24 DIAGNOSIS — T84012D Broken internal right knee prosthesis, subsequent encounter: Secondary | ICD-10-CM | POA: Diagnosis not present

## 2015-08-24 DIAGNOSIS — Z7982 Long term (current) use of aspirin: Secondary | ICD-10-CM | POA: Diagnosis not present

## 2015-08-24 DIAGNOSIS — M1990 Unspecified osteoarthritis, unspecified site: Secondary | ICD-10-CM | POA: Diagnosis not present

## 2015-08-24 DIAGNOSIS — K589 Irritable bowel syndrome without diarrhea: Secondary | ICD-10-CM | POA: Diagnosis not present

## 2015-08-24 DIAGNOSIS — F329 Major depressive disorder, single episode, unspecified: Secondary | ICD-10-CM | POA: Diagnosis not present

## 2015-08-25 DIAGNOSIS — M1990 Unspecified osteoarthritis, unspecified site: Secondary | ICD-10-CM | POA: Diagnosis not present

## 2015-08-25 DIAGNOSIS — Z7982 Long term (current) use of aspirin: Secondary | ICD-10-CM | POA: Diagnosis not present

## 2015-08-25 DIAGNOSIS — I10 Essential (primary) hypertension: Secondary | ICD-10-CM | POA: Diagnosis not present

## 2015-08-25 DIAGNOSIS — E669 Obesity, unspecified: Secondary | ICD-10-CM | POA: Diagnosis not present

## 2015-08-25 DIAGNOSIS — F411 Generalized anxiety disorder: Secondary | ICD-10-CM | POA: Diagnosis not present

## 2015-08-25 DIAGNOSIS — Z6834 Body mass index (BMI) 34.0-34.9, adult: Secondary | ICD-10-CM | POA: Diagnosis not present

## 2015-08-25 DIAGNOSIS — T84012D Broken internal right knee prosthesis, subsequent encounter: Secondary | ICD-10-CM | POA: Diagnosis not present

## 2015-08-25 DIAGNOSIS — K589 Irritable bowel syndrome without diarrhea: Secondary | ICD-10-CM | POA: Diagnosis not present

## 2015-08-25 DIAGNOSIS — F329 Major depressive disorder, single episode, unspecified: Secondary | ICD-10-CM | POA: Diagnosis not present

## 2015-08-26 DIAGNOSIS — I10 Essential (primary) hypertension: Secondary | ICD-10-CM | POA: Diagnosis not present

## 2015-08-26 DIAGNOSIS — F411 Generalized anxiety disorder: Secondary | ICD-10-CM | POA: Diagnosis not present

## 2015-08-26 DIAGNOSIS — T84012D Broken internal right knee prosthesis, subsequent encounter: Secondary | ICD-10-CM | POA: Diagnosis not present

## 2015-08-26 DIAGNOSIS — E669 Obesity, unspecified: Secondary | ICD-10-CM | POA: Diagnosis not present

## 2015-08-26 DIAGNOSIS — F329 Major depressive disorder, single episode, unspecified: Secondary | ICD-10-CM | POA: Diagnosis not present

## 2015-08-26 DIAGNOSIS — Z6834 Body mass index (BMI) 34.0-34.9, adult: Secondary | ICD-10-CM | POA: Diagnosis not present

## 2015-08-26 DIAGNOSIS — K589 Irritable bowel syndrome without diarrhea: Secondary | ICD-10-CM | POA: Diagnosis not present

## 2015-08-26 DIAGNOSIS — M1990 Unspecified osteoarthritis, unspecified site: Secondary | ICD-10-CM | POA: Diagnosis not present

## 2015-08-26 DIAGNOSIS — Z7982 Long term (current) use of aspirin: Secondary | ICD-10-CM | POA: Diagnosis not present

## 2015-08-29 DIAGNOSIS — M1990 Unspecified osteoarthritis, unspecified site: Secondary | ICD-10-CM | POA: Diagnosis not present

## 2015-08-29 DIAGNOSIS — F329 Major depressive disorder, single episode, unspecified: Secondary | ICD-10-CM | POA: Diagnosis not present

## 2015-08-29 DIAGNOSIS — F411 Generalized anxiety disorder: Secondary | ICD-10-CM | POA: Diagnosis not present

## 2015-08-29 DIAGNOSIS — Z6834 Body mass index (BMI) 34.0-34.9, adult: Secondary | ICD-10-CM | POA: Diagnosis not present

## 2015-08-29 DIAGNOSIS — E669 Obesity, unspecified: Secondary | ICD-10-CM | POA: Diagnosis not present

## 2015-08-29 DIAGNOSIS — T84012D Broken internal right knee prosthesis, subsequent encounter: Secondary | ICD-10-CM | POA: Diagnosis not present

## 2015-08-29 DIAGNOSIS — Z7982 Long term (current) use of aspirin: Secondary | ICD-10-CM | POA: Diagnosis not present

## 2015-08-29 DIAGNOSIS — I10 Essential (primary) hypertension: Secondary | ICD-10-CM | POA: Diagnosis not present

## 2015-08-29 DIAGNOSIS — K589 Irritable bowel syndrome without diarrhea: Secondary | ICD-10-CM | POA: Diagnosis not present

## 2015-08-31 DIAGNOSIS — Z6834 Body mass index (BMI) 34.0-34.9, adult: Secondary | ICD-10-CM | POA: Diagnosis not present

## 2015-08-31 DIAGNOSIS — F329 Major depressive disorder, single episode, unspecified: Secondary | ICD-10-CM | POA: Diagnosis not present

## 2015-08-31 DIAGNOSIS — T84012D Broken internal right knee prosthesis, subsequent encounter: Secondary | ICD-10-CM | POA: Diagnosis not present

## 2015-08-31 DIAGNOSIS — Z7982 Long term (current) use of aspirin: Secondary | ICD-10-CM | POA: Diagnosis not present

## 2015-08-31 DIAGNOSIS — M1990 Unspecified osteoarthritis, unspecified site: Secondary | ICD-10-CM | POA: Diagnosis not present

## 2015-08-31 DIAGNOSIS — I10 Essential (primary) hypertension: Secondary | ICD-10-CM | POA: Diagnosis not present

## 2015-08-31 DIAGNOSIS — F411 Generalized anxiety disorder: Secondary | ICD-10-CM | POA: Diagnosis not present

## 2015-08-31 DIAGNOSIS — E669 Obesity, unspecified: Secondary | ICD-10-CM | POA: Diagnosis not present

## 2015-08-31 DIAGNOSIS — K589 Irritable bowel syndrome without diarrhea: Secondary | ICD-10-CM | POA: Diagnosis not present

## 2015-09-02 DIAGNOSIS — T84012D Broken internal right knee prosthesis, subsequent encounter: Secondary | ICD-10-CM | POA: Diagnosis not present

## 2015-09-02 DIAGNOSIS — K589 Irritable bowel syndrome without diarrhea: Secondary | ICD-10-CM | POA: Diagnosis not present

## 2015-09-02 DIAGNOSIS — I10 Essential (primary) hypertension: Secondary | ICD-10-CM | POA: Diagnosis not present

## 2015-09-02 DIAGNOSIS — E669 Obesity, unspecified: Secondary | ICD-10-CM | POA: Diagnosis not present

## 2015-09-02 DIAGNOSIS — Z7982 Long term (current) use of aspirin: Secondary | ICD-10-CM | POA: Diagnosis not present

## 2015-09-02 DIAGNOSIS — F329 Major depressive disorder, single episode, unspecified: Secondary | ICD-10-CM | POA: Diagnosis not present

## 2015-09-02 DIAGNOSIS — M1990 Unspecified osteoarthritis, unspecified site: Secondary | ICD-10-CM | POA: Diagnosis not present

## 2015-09-02 DIAGNOSIS — Z6834 Body mass index (BMI) 34.0-34.9, adult: Secondary | ICD-10-CM | POA: Diagnosis not present

## 2015-09-02 DIAGNOSIS — F411 Generalized anxiety disorder: Secondary | ICD-10-CM | POA: Diagnosis not present

## 2015-09-05 MED FILL — HYDROCODON-APAP 7.5-325: 7.5-325 | 12 days supply | Qty: 80 | Fill #0

## 2015-09-08 ENCOUNTER — Ambulatory Visit: Payer: 59 | Attending: Orthopedic Surgery

## 2015-09-08 DIAGNOSIS — M25561 Pain in right knee: Secondary | ICD-10-CM | POA: Insufficient documentation

## 2015-09-08 NOTE — Therapy (Addendum)
[place] MAIN [facility] SERVICES [address] [place], [place], [address] Phone: [phone]   Fax:  [phone]  Physical Therapy Evaluation  Patient Details  Name: QUINYA ZAUNBRECHER MRN: [id number] Date of Birth: 09/12/1965 Referring Provider: Paralee Cancel  Encounter Date: 09/08/2015      PT End of Session - 09/08/15 1652    Visit Number 1   Number of Visits 13   Date for PT Re-Evaluation 10/06/15   PT Start Time 1400   PT Stop Time 1452   PT Time Calculation (min) 52 min   Activity Tolerance Patient tolerated treatment well   Behavior During Therapy [facility] for tasks assessed/performed      Past Medical History  Diagnosis Date  . Hypertension   . Diverticulosis   . History of esophagitis   . OA (osteoarthritis) of knee     RIGHT  . Arthritis     RIGHT HIP AND WRIST  . Anal fissure   . Anemia   . GERD (gastroesophageal reflux disease)     OTC PRN  . Anxiety     Past Surgical History  Procedure Laterality Date  . Cesarean section  1994  . Anal fissure repair  06-13-2000  . D & c hysteroscopy /  novasure endometrial ablation  11-15-2005  . Knee arthroscopy w/ meniscectomy Right   . Right knee patellofemoral arthroplasty  06-10-2008  . Bunionectomy Right 1991  . Septoplasty  2000  . Transthoracic echocardiogram  06-03-2008    NORMAL /  EF 60%  . Tubal ligation  1996  . Tonsillectomy  AGE 50  . Knee arthroscopy with medial menisectomy Right 10/06/2013    Procedure: RIGHT KNEE ARTHROSCOPY WITH CHONDROPLASTY;  Surgeon: Sydnee Cabal, MD;  Location: [facility];  Service: Orthopedics;  Laterality: Right;  . Conversion to total knee Right 08/22/2015    Procedure: partial knee CONVERSION TO RIGHT TOTAL KNEE;  Surgeon: Paralee Cancel, MD;  Location: [facility];  Service: Orthopedics;  Laterality: Right;    There were no vitals filed for this visit.  Visit Diagnosis:  Right knee pain - Plan: PT plan of care  cert/re-cert      Subjective Assessment - 09/08/15 1411    Subjective pt states that she had a partial R knee replacement in 2009 that did not go well and the "knee was not right" and that it "popped and grinded". pt states she had a R knee arthroscopy done in 2015, also recieved a cortisone shot in R knee in october 2016, pt also had a R lateral release of IT band. pt had R knee replacement on the 23 of january 2017. pt reports that she ocassionally will wake at night with R knee pain; pillow between legs does not help. pt denies numbness or tingling except at anterior R knee and no pain above or below R knee. pt states that she would like to go back to the gym.   Limitations Walking;Standing;Sitting   How long can you sit comfortably? pressure behind leg 5 to 10 min   How long can you walk comfortably? 30 min    Patient Stated Goals go back to gym, get back to work   Currently in Pain? Yes   Pain Score 3    Pain Location Knee   Pain Orientation Right   Pain Onset 1 to 4 weeks ago   Pain Relieving Factors ice, hydrocodone   Effect of Pain on Daily Activities cooking dinner hurt, putting  shoe and sock on            [facility] PT Assessment - 09/08/15 0001    Assessment   Medical Diagnosis TKA   Referring Provider Paralee Cancel   Onset Date/Surgical Date 08/06/15   Next MD Visit 10/05/15   Prior Therapy yes   Precautions   Precautions None   Restrictions   Weight Bearing Restrictions No   Balance Screen   Has the patient fallen in the past 6 months No   [place] residence   Living Arrangements Children;Spouse/significant other   Available Help at Discharge Family   Type of [place] to enter   Entrance Stairs-Number of Steps 3   Entrance Stairs-Rails None   Home Layout Two level   Alternate Level Stairs-Number of Steps 14   Alternate [place] - 4 wheels;Cane - single point;Bedside  commode   Prior Function   Level of Independence Independent   Vocation Full time employment   Vocation Requirements bending, lifting, standing   Leisure scrapbooking       POSTURE/OBSERVATION: No sign of infection  Pt has some swelling around R knee  PROM/AROM:  AROM: R knee extension: - 15 degrees R knee flexion: 70  STRENGTH:  Graded on a 0-5 scale Muscle Group  Left  Right    Shoulder Flex       Shoulder Abd       Shoulder Ex       Horizontal Abd       Horizontal Add       Elbow Flex      Elbow Ex    Wrist Flex    Wrist Ex    Hip Flex  5 4  Hip Abd  4 4-  Hip Add  4 4-  Hip Ext     Hip IR/ER       Knee Flex  4+ 4-  Knee Ext  4+ 4-  Ankle DF  5 5  Ankle PF        Edema measurement: R knee 2cm above patella: 50.5cm L knee 2cm above patella: 48cm  GAIT: Pt has decreased stance time on R leg with decreased hip and knee flexion on R leg. Pt ambulates with cane use in L hand  OUTCOME MEASURES: LEFS 23/80 disability   Therex: SLR x10 Quad set x 10 Short arc quad x 10 Heel slide with belt assist x 10 Static knee extension with towel roll under calf x 2 min Pt requires min verbal and tactile cues for proper exercise performance including quad activation.       Problem List Patient Active Problem List   Diagnosis Date Noted  . Obese 08/23/2015  . 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 Edema 08/17/2015  . Generalized anxiety disorder 08/17/2015  . Depression 08/17/2015  . RLQ abdominal pain 03/29/2015  . S/P right knee arthroscopy 10/06/2013  . SPLENOMEGALY 10/15/2008  . ESOPHAGITIS 10/14/2008  . NAUSEA 10/14/2008  . DYSPEPSIA 04/14/2008  . RECTAL FISSURE 04/14/2008  . Abdominal pain, left upper quadrant 04/14/2008  . GERD 04/12/2008  . DUODENITIS 04/12/2008  . IRRITABLE BOWEL SYNDROME 04/12/2008  . ANEMIA, IRON DEFICIENCY, HX OF 04/12/2008  . ESOPHAGITIS, HX OF 04/12/2008   Domingo Pulse, SPT This entire session was performed  under direct supervision and direction of a licensed therapist/therapist assistant . I have personally read, edited and  approve of the note as written. Gorden Harms. Tortorici, PT, DPT [phone]   Tortorici,Ashley 09/08/2015, 6:06 PM  [place] MAIN [facility] SERVICES [address] [place], [place], [address] Phone: [phone]   Fax:  [phone]  Name: RAYELYN SCHEIBLE MRN: [id number] Date of Birth: 1966/05/15

## 2015-09-12 ENCOUNTER — Ambulatory Visit: Payer: 59

## 2015-09-12 DIAGNOSIS — M25561 Pain in right knee: Secondary | ICD-10-CM | POA: Diagnosis not present

## 2015-09-12 NOTE — Therapy (Signed)
[place] MAIN [facility] SERVICES [address] [place], [place], [address] Phone: [phone]   Fax:  [phone]  Physical Therapy Treatment  Patient Details  Name: Jill Cook MRN: [id number] Date of Birth: [birth year] Referring Provider: Paralee Cancel  Encounter Date: 09/12/2015      PT End of Session - 09/12/15 1019    Visit Number 2   Number of Visits 13   Date for PT Re-Evaluation 10/06/15   PT Start Time [id number]   PT Stop Time 1105   PT Time Calculation (min) 50 min   Activity Tolerance Patient tolerated treatment well   Behavior During Therapy [facility] for tasks assessed/performed      Past Medical History  Diagnosis Date  . Hypertension   . Diverticulosis   . History of esophagitis   . OA (osteoarthritis) of knee     RIGHT  . Arthritis     RIGHT HIP AND WRIST  . Anal fissure   . Anemia   . GERD (gastroesophageal reflux disease)     OTC PRN  . Anxiety     Past Surgical History  Procedure Laterality Date  . Cesarean section  1994  . Anal fissure repair  06-13-2000  . D & c hysteroscopy /  novasure endometrial ablation  11-15-2005  . Knee arthroscopy w/ meniscectomy Right   . Right knee patellofemoral arthroplasty  06-10-2008  . Bunionectomy Right 1991  . Septoplasty  2000  . Transthoracic echocardiogram  06-03-2008    NORMAL /  EF 60%  . Tubal ligation  1996  . Tonsillectomy  AGE 50  . Knee arthroscopy with medial menisectomy Right 10/06/2013    Procedure: RIGHT KNEE ARTHROSCOPY WITH CHONDROPLASTY;  Surgeon: Sydnee Cabal, MD;  Location: [facility];  Service: Orthopedics;  Laterality: Right;  . Conversion to total knee Right 08/22/2015    Procedure: partial knee CONVERSION TO RIGHT TOTAL KNEE;  Surgeon: Paralee Cancel, MD;  Location: [facility];  Service: Orthopedics;  Laterality: Right;    There were no vitals filed for this visit.  Visit Diagnosis:  Right knee pain      Subjective Assessment -  09/12/15 1153    Subjective pt reports she "had a round weekend" with a lot of R knee pain and stiffness. mostly anteriorly and medially.    Limitations Walking;Standing;Sitting   How long can you sit comfortably? pressure behind leg 5 to 10 min   How long can you walk comfortably? 30 min    Patient Stated Goals go back to gym, get back to work   Currently in Pain? Yes   Pain Score 4    Pain Location --  R knee anterior   Pain Onset 1 to 4 weeks ago      Therex;  Nustep L0 for active assisted knee flexion seat level 11 then progressed to seat level 9 x 4 min Leg press 75lbs x 10 SAQ with assist into more knee extension 2x10  Manual therapy: Patellar mobilizations grade 3 in all directions 2x20 AP and PA tib / femur mobs grade 3 4 bouts of 20s  Distal and proximal tib/fib mobs grade 3 - 2 bouts of 20s each PROM of R knee into end range extension and flexion with small amplitude oscillations at end range to promote more ROM grade 3 4-5 bouts of each  STM and pt instruction of STM using "the stick" (or rolling pin at home) to quad and calf.   Cold pack  to R knee (elevated with bolster) x 10 min no charge                          PT Education - 09/12/15 1154    Education provided Yes   Education Details increasing knee flexion during swing phase and produce more toe off on the R during gait   Person(s) Educated Patient   Methods Explanation   Comprehension Verbalized understanding;Returned demonstration             PT Long Term Goals - 09/08/15 1710    PT LONG TERM GOAL #1   Title pt will have increase R knee flexion AROM to 120 degrees for to descend stairs.   Baseline 70deg   Time 6   Period Weeks   Status New   PT LONG TERM GOAL #2   Title pt will have a decrease in knee pain <2/10 in order to sleep thorugh the night without waking   Time 6   Period Weeks   Status New   PT LONG TERM GOAL #3   Title pt will be independent in HEP to improve  strength, mobility and decrease pain for better functional independence with ADLs   Time 6   Period Weeks   Status New   PT LONG TERM GOAL #4   Title pt will improve LEFS socre by 9 pts demonstrating a decrease in disability   Baseline 23   Time 6   Period Weeks   Status New               Plan - 09/12/15 1155    Clinical Impression Statement pt tolerated manual therapy well today with focus on improving ROM. R knee PROM at end of session today was 12-86deg which is an improvement since the evaluation. pt has difficutly with quad set likely due to lack of full extension. pt reports he has good pain relief with ice at end of session.    Pt will benefit from skilled therapeutic intervention in order to improve on the following deficits Abnormal gait;Decreased endurance;Decreased mobility;Decreased range of motion;Decreased strength;Decreased activity tolerance;Increased edema   Rehab Potential Excellent   Clinical Impairments Affecting Rehab Potential prior knee surgery   PT Frequency 2x / week   PT Duration 6 weeks   PT Treatment/Interventions ADLs/Self Care Home Management;Cryotherapy;Electrical Stimulation;Neuromuscular re-education;Balance training;Therapeutic exercise;Therapeutic activities;Functional mobility training;Stair training;Gait training;Manual techniques;Passive range of motion;Dry needling;Taping   PT Home Exercise Plan Heel slide, SAQ, knee extension stretch, SLR   Consulted and Agree with Plan of Care Patient        Problem List Patient Active Problem List   Diagnosis Date Noted  . Obese 08/23/2015  . 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 Edema 08/17/2015  . Generalized anxiety disorder 08/17/2015  . Depression 08/17/2015  . RLQ abdominal pain 03/29/2015  . S/P right knee arthroscopy 10/06/2013  . SPLENOMEGALY 10/15/2008  . ESOPHAGITIS 10/14/2008  . NAUSEA 10/14/2008  . DYSPEPSIA 04/14/2008  . RECTAL FISSURE 04/14/2008  . Abdominal pain,  left upper quadrant 04/14/2008  . GERD 04/12/2008  . DUODENITIS 04/12/2008  . IRRITABLE BOWEL SYNDROME 04/12/2008  . ANEMIA, IRON DEFICIENCY, HX OF 04/12/2008  . ESOPHAGITIS, HX OF 04/12/2008   Gorden Harms. Derrian Poli, PT, DPT [phone]   Garrit Marrow 09/12/2015, 11:57 AM  [place] MAIN [facility] SERVICES [address] [place], [place], [address] Phone: [phone]   Fax:  [phone]  Name: ALEDA CARMENATE MRN: [id number] Date of Birth: March 04, 1966

## 2015-09-15 ENCOUNTER — Ambulatory Visit: Payer: 59

## 2015-09-15 DIAGNOSIS — M25561 Pain in right knee: Secondary | ICD-10-CM

## 2015-09-15 NOTE — Therapy (Signed)
[place] MAIN [facility] SERVICES [address] [place], [place], [address] Phone: [phone]   Fax:  [phone]  Physical Therapy Treatment  Patient Details  Name: Jill Cook MRN: [id number] Date of Birth: September 01, 1965 Referring Provider: Paralee Cancel  Encounter Date: 09/15/2015      PT End of Session - 09/15/15 1128    Visit Number 3   Number of Visits 13   Date for PT Re-Evaluation 10/06/15   PT Start Time 1030   PT Stop Time 1150   PT Time Calculation (min) 80 min   Activity Tolerance Patient tolerated treatment well   Behavior During Therapy [facility] for tasks assessed/performed      Past Medical History  Diagnosis Date  . Hypertension   . Diverticulosis   . History of esophagitis   . OA (osteoarthritis) of knee     RIGHT  . Arthritis     RIGHT HIP AND WRIST  . Anal fissure   . Anemia   . GERD (gastroesophageal reflux disease)     OTC PRN  . Anxiety     Past Surgical History  Procedure Laterality Date  . Cesarean section  1994  . Anal fissure repair  06-13-2000  . D & c hysteroscopy /  novasure endometrial ablation  11-15-2005  . Knee arthroscopy w/ meniscectomy Right   . Right knee patellofemoral arthroplasty  06-10-2008  . Bunionectomy Right 1991  . Septoplasty  2000  . Transthoracic echocardiogram  06-03-2008    NORMAL /  EF 60%  . Tubal ligation  1996  . Tonsillectomy  AGE 23  . Knee arthroscopy with medial menisectomy Right 10/06/2013    Procedure: RIGHT KNEE ARTHROSCOPY WITH CHONDROPLASTY;  Surgeon: Sydnee Cabal, MD;  Location: [facility];  Service: Orthopedics;  Laterality: Right;  . Conversion to total knee Right 08/22/2015    Procedure: partial knee CONVERSION TO RIGHT TOTAL KNEE;  Surgeon: Paralee Cancel, MD;  Location: [facility];  Service: Orthopedics;  Laterality: Right;    There were no vitals filed for this visit.  Visit Diagnosis:  Right knee pain      Subjective Assessment -  09/15/15 1125    Subjective pt reports her R knee felt better over the weekend. she said it felt looser    Limitations Walking;Standing;Sitting   How long can you sit comfortably? pressure behind leg 5 to 10 min   How long can you walk comfortably? 30 min    Patient Stated Goals go back to gym, get back to work   Currently in Pain? Yes   Pain Score 4    Pain Location --  R knee   Pain Descriptors / Indicators Aching   Pain Onset 1 to 4 weeks ago     Therex: preformed by Domingo Pulse, SPT  Nustep L0 at seat #10  X 2 min for passive knee flexion ROM  With min cues to improve ROM Terminal knee extensions in // bars bilaterally with yellow band 10 x 2 sets with UE support Mini squats in // bars 10 x 2 sets with UE support Standing hip abduction in // bars with yellow band bilaterally 10 x 2 sets with UE support Leg press 10 x 2 sets at 90 lbs Forward step ups with 6 in step in // bars each leg leading 10 x 2 sets with UE support Pt required min cues for sequencing and correct muscle activation for all exercises    Manual therapy: performed  by Gorden Harms Alanda Colton, PT, DPT [phone]  Patellar mobilizations grade 3 in all directions 2x20 AP and PA tib / femur mobs grade 3 4 bouts of 20s  Distal and proximal tib/fib mobs grade 3 - 2 bouts of 20s each PROM of R knee into end range extension and flexion with small amplitude oscillations at end range to promote more ROM grade 3 4-5 bouts of each  MFR to medial and lateral knee joint  Cold pack to R knee (elevated with bolster) x 10 min no charge                           PT Education - 09/15/15 1127    Education provided Yes   Education Details continue with HEP compliance    Person(s) Educated Patient   Methods Explanation   Comprehension Verbalized understanding             PT Long Term Goals - 09/08/15 1710    PT LONG TERM GOAL #1   Title pt will have increase R knee flexion AROM to 120 degrees for to  descend stairs.   Baseline 70deg   Time 6   Period Weeks   Status New   PT LONG TERM GOAL #2   Title pt will have a decrease in knee pain <2/10 in order to sleep thorugh the night without waking   Time 6   Period Weeks   Status New   PT LONG TERM GOAL #3   Title pt will be independent in HEP to improve strength, mobility and decrease pain for better functional independence with ADLs   Time 6   Period Weeks   Status New   PT LONG TERM GOAL #4   Title pt will improve LEFS socre by 9 pts demonstrating a decrease in disability   Baseline 23   Time 6   Period Weeks   Status New               Plan - 09/15/15 1129    Clinical Impression Statement pt made good progress again with R knee ROM today ending at 10-90deg in supine. most of patients pain is located to the anterior medial knee. progressed therex today with good tolerance.    Pt will benefit from skilled therapeutic intervention in order to improve on the following deficits Abnormal gait;Decreased endurance;Decreased mobility;Decreased range of motion;Decreased strength;Decreased activity tolerance;Increased edema   Rehab Potential Excellent   Clinical Impairments Affecting Rehab Potential prior knee surgery   PT Frequency 2x / week   PT Duration 6 weeks   PT Treatment/Interventions ADLs/Self Care Home Management;Cryotherapy;Electrical Stimulation;Neuromuscular re-education;Balance training;Therapeutic exercise;Therapeutic activities;Functional mobility training;Stair training;Gait training;Manual techniques;Passive range of motion;Dry needling;Taping   PT Home Exercise Plan Heel slide, SAQ, knee extension stretch, SLR   Consulted and Agree with Plan of Care Patient        Problem List Patient Active Problem List   Diagnosis Date Noted  . Obese 08/23/2015  . 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 Edema 08/17/2015  . Generalized anxiety disorder 08/17/2015  . Depression 08/17/2015  . RLQ abdominal  pain 03/29/2015  . S/P right knee arthroscopy 10/06/2013  . SPLENOMEGALY 10/15/2008  . ESOPHAGITIS 10/14/2008  . NAUSEA 10/14/2008  . DYSPEPSIA 04/14/2008  . RECTAL FISSURE 04/14/2008  . Abdominal pain, left upper quadrant 04/14/2008  . GERD 04/12/2008  . DUODENITIS 04/12/2008  . IRRITABLE BOWEL SYNDROME 04/12/2008  . ANEMIA, IRON DEFICIENCY,  HX OF 04/12/2008  . ESOPHAGITIS, HX OF 04/12/2008   Gorden Harms. Chevie Birkhead, PT, DPT [phone]   Carvel Huskins 09/15/2015, 11:46 AM  [place] MAIN [facility] SERVICES [address] [place], [place], [address] Phone: [phone]   Fax:  [phone]  Name: SELENY SMULLEN MRN: [id number] Date of Birth: February 23, 1966

## 2015-09-20 ENCOUNTER — Ambulatory Visit: Payer: 59

## 2015-09-20 DIAGNOSIS — M25561 Pain in right knee: Secondary | ICD-10-CM

## 2015-09-20 NOTE — Therapy (Signed)
[place] MAIN [facility] SERVICES [address] [place], [place], [address] Phone: [phone]   Fax:  [phone]  Physical Therapy Treatment  Patient Details  Name: Jill Cook MRN: [id number] Date of Birth: Jan 19, 1966 Referring Provider: Paralee Cancel  Encounter Date: 09/20/2015      PT End of Session - 09/20/15 1129    Visit Number 4   Number of Visits 13   Date for PT Re-Evaluation 10/06/15   PT Start Time 1030   PT Stop Time 1125   PT Time Calculation (min) 55 min   Activity Tolerance Patient tolerated treatment well   Behavior During Therapy [facility] for tasks assessed/performed      Past Medical History  Diagnosis Date  . Hypertension   . Diverticulosis   . History of esophagitis   . OA (osteoarthritis) of knee     RIGHT  . Arthritis     RIGHT HIP AND WRIST  . Anal fissure   . Anemia   . GERD (gastroesophageal reflux disease)     OTC PRN  . Anxiety     Past Surgical History  Procedure Laterality Date  . Cesarean section  1994  . Anal fissure repair  06-13-2000  . D & c hysteroscopy /  novasure endometrial ablation  11-15-2005  . Knee arthroscopy w/ meniscectomy Right   . Right knee patellofemoral arthroplasty  06-10-2008  . Bunionectomy Right 1991  . Septoplasty  2000  . Transthoracic echocardiogram  06-03-2008    NORMAL /  EF 60%  . Tubal ligation  1996  . Tonsillectomy  AGE 50  . Knee arthroscopy with medial menisectomy Right 10/06/2013    Procedure: RIGHT KNEE ARTHROSCOPY WITH CHONDROPLASTY;  Surgeon: Sydnee Cabal, MD;  Location: [facility];  Service: Orthopedics;  Laterality: Right;  . Conversion to total knee Right 08/22/2015    Procedure: partial knee CONVERSION TO RIGHT TOTAL KNEE;  Surgeon: Paralee Cancel, MD;  Location: [facility];  Service: Orthopedics;  Laterality: Right;    There were no vitals filed for this visit.  Visit Diagnosis:  Right knee pain      Subjective Assessment -  09/20/15 1126    Subjective pt reports going to store in previous week and being on feet for 30+ min and feeling some soreness after. pt states that pain has improved but still feels like it "isnt my knee" yet.   Limitations Walking;Standing;Sitting   How long can you sit comfortably? pressure behind leg 5 to 10 min   How long can you walk comfortably? 30 min    Patient Stated Goals go back to gym, get back to work   Currently in Pain? Yes   Pain Score 4    Pain Location Knee   Pain Orientation Right   Pain Onset 1 to 4 weeks ago      Manual Therapy:  AP mobilization of R tibiofemoral joint in supine with bolster grade III 40 sec x 3 bouts  PA mobilization of R tibiofemoral joint in prone knee bent 25 degree grade III 40 sec x 3 bouts Rotational anterior glide of R tibial condyle for terminal knee extension knee bent ar 25 degrees grade III 40 sec x 2 bouts Proximal and distal AP and PA R tibiofibular joint mobilization grade III in supine 40 sec x 3 bouts R Patellofemoral joint mobilization all directions 40 sec x 3 bouts Passive knee flexion in sitting with ossilations at end range 30 sec x 2  bouts  Therapeutic exercise:  Terminal knee extension with red band at R knee 10 x 3 sets Ascending and descending 6 in step in // bars with bilateral UE support leading each leg 10 x 3 sets Monster walks with red band 15 ft x 6 sets  Pt required min cues for exercise sequencing  Ice place on R knee 5 min (no charge)        PT Long Term Goals - 09/08/15 1710    PT LONG TERM GOAL #1   Title pt will have increase R knee flexion AROM to 120 degrees for to descend stairs.   Baseline 70deg   Time 6   Period Weeks   Status New   PT LONG TERM GOAL #2   Title pt will have a decrease in knee pain <2/10 in order to sleep thorugh the night without waking   Time 6   Period Weeks   Status New   PT LONG TERM GOAL #3   Title pt will be independent in HEP to improve strength, mobility and  decrease pain for better functional independence with ADLs   Time 6   Period Weeks   Status New   PT LONG TERM GOAL #4   Title pt will improve LEFS socre by 9 pts demonstrating a decrease in disability   Baseline 23   Time 6   Period Weeks   Status New               Plan - 09/20/15 1130    Clinical Impression Statement pt continues to experience some warmth and swelling in R knee. Incision looks to be healing well. pt has some decrease in patellar mobility and hypomobility at her tibiofemoral joint and tibiofibular joint with mobilization. pt was able to progress exercise and ascended and decended 6 in step with bilateral UE support. Pt was able to get 85 degrees knee flexion in sitting. pt will continue to benefit from therapy in order to increase R knee ROM and pain and increase LE strength    Pt will benefit from skilled therapeutic intervention in order to improve on the following deficits Abnormal gait;Decreased endurance;Decreased mobility;Decreased range of motion;Decreased strength;Decreased activity tolerance;Increased edema   Rehab Potential Excellent   Clinical Impairments Affecting Rehab Potential prior knee surgery   PT Frequency 2x / week   PT Duration 6 weeks   PT Treatment/Interventions ADLs/Self Care Home Management;Cryotherapy;Electrical Stimulation;Neuromuscular re-education;Balance training;Therapeutic exercise;Therapeutic activities;Functional mobility training;Stair training;Gait training;Manual techniques;Passive range of motion;Dry needling;Taping   PT Home Exercise Plan Heel slide, SAQ, knee extension stretch, SLR   Consulted and Agree with Plan of Care Patient        Problem List Patient Active Problem List   Diagnosis Date Noted  . Obese 08/23/2015  . 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 Edema 08/17/2015  . Generalized anxiety disorder 08/17/2015  . Depression 08/17/2015  . RLQ abdominal pain 03/29/2015  . S/P right knee  arthroscopy 10/06/2013  . SPLENOMEGALY 10/15/2008  . ESOPHAGITIS 10/14/2008  . NAUSEA 10/14/2008  . DYSPEPSIA 04/14/2008  . RECTAL FISSURE 04/14/2008  . Abdominal pain, left upper quadrant 04/14/2008  . GERD 04/12/2008  . DUODENITIS 04/12/2008  . IRRITABLE BOWEL SYNDROME 04/12/2008  . ANEMIA, IRON DEFICIENCY, HX OF 04/12/2008  . ESOPHAGITIS, HX OF 04/12/2008   Gorden Harms. Tortorici, PT, DPT [phone]  Tortorici,Ashley 09/20/2015, 1:19 PM  [place] MAIN [facility] SERVICES [address] [place], [place], [address] Phone:  [phone]   Fax:  [phone]  Name: Jill Cook MRN: [id number] Date of Birth: 1965-10-25

## 2015-09-22 ENCOUNTER — Ambulatory Visit: Payer: 59

## 2015-09-22 DIAGNOSIS — M25561 Pain in right knee: Secondary | ICD-10-CM | POA: Diagnosis not present

## 2015-09-22 NOTE — Therapy (Signed)
[place] MAIN [facility] SERVICES [address] [place], [place], [address] Phone: [phone]   Fax:  [phone]  Physical Therapy Treatment  Patient Details  Name: Jill Cook MRN: [id number] Date of Birth: 10/30/1965 Referring Provider: Paralee Cancel  Encounter Date: 09/22/2015      PT End of Session - 09/22/15 1128    Visit Number 5   Number of Visits 13   Date for PT Re-Evaluation 10/06/15   PT Start Time 1030   PT Stop Time 1132   PT Time Calculation (min) 62 min   Activity Tolerance Patient tolerated treatment well   Behavior During Therapy [facility] for tasks assessed/performed      Past Medical History  Diagnosis Date  . Hypertension   . Diverticulosis   . History of esophagitis   . OA (osteoarthritis) of knee     RIGHT  . Arthritis     RIGHT HIP AND WRIST  . Anal fissure   . Anemia   . GERD (gastroesophageal reflux disease)     OTC PRN  . Anxiety     Past Surgical History  Procedure Laterality Date  . Cesarean section  1994  . Anal fissure repair  06-13-2000  . D & c hysteroscopy /  novasure endometrial ablation  11-15-2005  . Knee arthroscopy w/ meniscectomy Right   . Right knee patellofemoral arthroplasty  06-10-2008  . Bunionectomy Right 1991  . Septoplasty  2000  . Transthoracic echocardiogram  06-03-2008    NORMAL /  EF 60%  . Tubal ligation  1996  . Tonsillectomy  AGE 13  . Knee arthroscopy with medial menisectomy Right 10/06/2013    Procedure: RIGHT KNEE ARTHROSCOPY WITH CHONDROPLASTY;  Surgeon: Sydnee Cabal, MD;  Location: [facility];  Service: Orthopedics;  Laterality: Right;  . Conversion to total knee Right 08/22/2015    Procedure: partial knee CONVERSION TO RIGHT TOTAL KNEE;  Surgeon: Paralee Cancel, MD;  Location: [facility];  Service: Orthopedics;  Laterality: Right;    There were no vitals filed for this visit.  Visit Diagnosis:  Right knee pain      Subjective Assessment -  09/22/15 1127    Subjective pt reports feeling more stiffness and pain today likely due to increased activity the previous day.   Limitations Walking;Standing;Sitting   How long can you sit comfortably? pressure behind leg 5 to 10 min   How long can you walk comfortably? 30 min    Patient Stated Goals go back to gym, get back to work   Currently in Pain? Yes   Pain Score 4    Pain Location Knee   Pain Orientation Anterior   Pain Onset 1 to 4 weeks ago     Manual Therapy:  AP and PA mobilizations of R tibiofemoral joint grade III 45 sec x 4 bouts each AP mobilization of superior and inferior R tibiofibular joint grade III 45 sec x 4 bouts each R Patellofemoral mobilization inferior to superior and lateral grade II due to pain 30 sec x 4 bouts each R Tibiofemoral distraction 20 sec x 2 bouts PROM to end range of R knee flexion with oscillations 20 sec x 2 bouts  Therapeutic exercise:  Lateral band walks in // bars with red band x 4 laps Mini squats in // bars with red band just below knees 10 x 3 sets TNE with small ball behind knee at wall 10 x 3 sets Ascending and descending 6 in step  bilaterally 10 x 2 sets  Electrical stimulation:  12 min interferential E-STIM at 9.2 mA increased to 9.8 mA on inferential pattern applied to R knee at 25 degree flexion with pillow under knee in supine. E-STIM was coupled with ice applied to R knee (no charge)        PT Long Term Goals - 09/08/15 1710    PT LONG TERM GOAL #1   Title pt will have increase R knee flexion AROM to 120 degrees for to descend stairs.   Baseline 70deg   Time 6   Period Weeks   Status New   PT LONG TERM GOAL #2   Title pt will have a decrease in knee pain <2/10 in order to sleep thorugh the night without waking   Time 6   Period Weeks   Status New   PT LONG TERM GOAL #3   Title pt will be independent in HEP to improve strength, mobility and decrease pain for better functional independence with ADLs   Time 6    Period Weeks   Status New   PT LONG TERM GOAL #4   Title pt will improve LEFS socre by 9 pts demonstrating a decrease in disability   Baseline 23   Time 6   Period Weeks   Status New               Plan - 09/22/15 1131    Clinical Impression Statement pt continues to demonstrate some swelling and erythema in R knee and no significant difference in ROM. pt has hypomobility of tibiofemoral AP, PA and mobilization and  tibiofibular joint. pt experienced fatigue at the end of treatment and was able to progress exercises. per pt, she spend more time using her knee the previous day.  pt will benefit from therapy in order to increase ROM, decrease pain and edema and increase LE strength and endurance.   Pt will benefit from skilled therapeutic intervention in order to improve on the following deficits Abnormal gait;Decreased endurance;Decreased mobility;Decreased range of motion;Decreased strength;Decreased activity tolerance;Increased edema   Rehab Potential Excellent   Clinical Impairments Affecting Rehab Potential prior knee surgery   PT Frequency 2x / week   PT Duration 6 weeks   PT Treatment/Interventions ADLs/Self Care Home Management;Cryotherapy;Electrical Stimulation;Neuromuscular re-education;Balance training;Therapeutic exercise;Therapeutic activities;Functional mobility training;Stair training;Gait training;Manual techniques;Passive range of motion;Dry needling;Taping   PT Home Exercise Plan Heel slide, SAQ, knee extension stretch, SLR   Consulted and Agree with Plan of Care Patient        Problem List Patient Active Problem List   Diagnosis Date Noted  . Obese 08/23/2015  . 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 Edema 08/17/2015  . Generalized anxiety disorder 08/17/2015  . Depression 08/17/2015  . RLQ abdominal pain 03/29/2015  . S/P right knee arthroscopy 10/06/2013  . SPLENOMEGALY 10/15/2008  . ESOPHAGITIS 10/14/2008  . NAUSEA 10/14/2008  .  DYSPEPSIA 04/14/2008  . RECTAL FISSURE 04/14/2008  . Abdominal pain, left upper quadrant 04/14/2008  . GERD 04/12/2008  . DUODENITIS 04/12/2008  . IRRITABLE BOWEL SYNDROME 04/12/2008  . ANEMIA, IRON DEFICIENCY, HX OF 04/12/2008  . ESOPHAGITIS, HX OF 04/12/2008   Domingo Pulse, SPT This entire session was performed under direct supervision and direction of a licensed therapist/therapist assistant . I have personally read, edited and approve of the note as written. Gorden Harms. Tortorici, PT, DPT [phone]   Tortorici,Ashley 09/22/2015, 2:51 PM  [place] MAIN [facility] SERVICES [address]  [place], [place], [address] Phone: [phone]   Fax:  [phone]  Name: ALEJANDRO MCCALLUM MRN: [id number] Date of Birth: [birth year]

## 2015-09-27 ENCOUNTER — Ambulatory Visit: Payer: 59

## 2015-09-27 DIAGNOSIS — M25561 Pain in right knee: Secondary | ICD-10-CM | POA: Diagnosis not present

## 2015-09-27 NOTE — Therapy (Signed)
[place] MAIN [facility] SERVICES [address] [place], [place], [address] Phone: [phone]   Fax:  [phone]  Physical Therapy Treatment  Patient Details  Name: Jill Cook MRN: [id number] Date of Birth: [birth year] Referring Provider: Paralee Cancel  Encounter Date: 09/27/2015      PT End of Session - 09/27/15 1410    Visit Number 6   Number of Visits 13   Date for PT Re-Evaluation 10/06/15   PT Start Time 1030   PT Stop Time 1115   PT Time Calculation (min) 45 min   Activity Tolerance Patient tolerated treatment well   Behavior During Therapy [facility] for tasks assessed/performed      Past Medical History  Diagnosis Date  . Hypertension   . Diverticulosis   . History of esophagitis   . OA (osteoarthritis) of knee     RIGHT  . Arthritis     RIGHT HIP AND WRIST  . Anal fissure   . Anemia   . GERD (gastroesophageal reflux disease)     OTC PRN  . Anxiety     Past Surgical History  Procedure Laterality Date  . Cesarean section  1994  . Anal fissure repair  06-13-2000  . D & c hysteroscopy /  novasure endometrial ablation  11-15-2005  . Knee arthroscopy w/ meniscectomy Right   . Right knee patellofemoral arthroplasty  06-10-2008  . Bunionectomy Right 1991  . Septoplasty  2000  . Transthoracic echocardiogram  06-03-2008    NORMAL /  EF 60%  . Tubal ligation  1996  . Tonsillectomy  AGE 50  . Knee arthroscopy with medial menisectomy Right 10/06/2013    Procedure: RIGHT KNEE ARTHROSCOPY WITH CHONDROPLASTY;  Surgeon: Sydnee Cabal, MD;  Location: [facility];  Service: Orthopedics;  Laterality: Right;  . Conversion to total knee Right 08/22/2015    Procedure: partial knee CONVERSION TO RIGHT TOTAL KNEE;  Surgeon: Paralee Cancel, MD;  Location: [facility];  Service: Orthopedics;  Laterality: Right;    There were no vitals filed for this visit.  Visit Diagnosis:  Right knee pain      Subjective Assessment -  09/27/15 1409    Subjective pt reports her knee is feeling a little better since the weekend    Limitations Walking;Standing;Sitting   How long can you sit comfortably? pressure behind leg 5 to 10 min   How long can you walk comfortably? 30 min    Patient Stated Goals go back to gym, get back to work   Currently in Pain? Yes   Pain Score 4    Pain Location Knee   Pain Orientation Right   Pain Descriptors / Indicators Aching   Pain Onset 1 to 4 weeks ago          ThereX: NUSTEP for LE AAROM seat level 8 x 3 min with 10s holds for R knee flexion TKE with ball on wall 2x10 Ball squats on wall <90 deg 2x10 Fwd/side lunge on BOSU 2x10 each RLE only Pt requires min verbal and tactile cues for proper exercise performance    Manual therapy STM/MFR to medial and lateral knee, scar massage AP/PA tib/femur mobilizations grade III-IV 3 bouts 30s each Proximal tib/fib mob AP/PA grade 3 20s x 2    PROM at end range knee extension and flexion in supine grade 4 oscillations at end range 15s x 5 each Seated knee flexion PROM with small oscillations at end range x 4  ROM  today 8-100deg                           PT Long Term Goals - 09/08/15 1710    PT LONG TERM GOAL #1   Title pt will have increase R knee flexion AROM to 120 degrees for to descend stairs.   Baseline 70deg   Time 6   Period Weeks   Status New   PT LONG TERM GOAL #2   Title pt will have a decrease in knee pain <2/10 in order to sleep thorugh the night without waking   Time 6   Period Weeks   Status New   PT LONG TERM GOAL #3   Title pt will be independent in HEP to improve strength, mobility and decrease pain for better functional independence with ADLs   Time 6   Period Weeks   Status New   PT LONG TERM GOAL #4   Title pt will improve LEFS socre by 9 pts demonstrating a decrease in disability   Baseline 23   Time 6   Period Weeks   Status New               Plan - 09/27/15 1411     Clinical Impression Statement pt had good progression of ROM today 8-100 deg after manual therapy. pt did well with progression of LE strengthening as well today. pain is well modified with ice following treatment.    Pt will benefit from skilled therapeutic intervention in order to improve on the following deficits Abnormal gait;Decreased endurance;Decreased mobility;Decreased range of motion;Decreased strength;Decreased activity tolerance;Increased edema   Rehab Potential Excellent   Clinical Impairments Affecting Rehab Potential prior knee surgery   PT Frequency 2x / week   PT Duration 6 weeks   PT Treatment/Interventions ADLs/Self Care Home Management;Cryotherapy;Electrical Stimulation;Neuromuscular re-education;Balance training;Therapeutic exercise;Therapeutic activities;Functional mobility training;Stair training;Gait training;Manual techniques;Passive range of motion;Dry needling;Taping   PT Home Exercise Plan Heel slide, SAQ, knee extension stretch, SLR   Consulted and Agree with Plan of Care Patient        Problem List Patient Active Problem List   Diagnosis Date Noted  . Obese 08/23/2015  . 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 Edema 08/17/2015  . Generalized anxiety disorder 08/17/2015  . Depression 08/17/2015  . RLQ abdominal pain 03/29/2015  . S/P right knee arthroscopy 10/06/2013  . SPLENOMEGALY 10/15/2008  . ESOPHAGITIS 10/14/2008  . NAUSEA 10/14/2008  . DYSPEPSIA 04/14/2008  . RECTAL FISSURE 04/14/2008  . Abdominal pain, left upper quadrant 04/14/2008  . GERD 04/12/2008  . DUODENITIS 04/12/2008  . IRRITABLE BOWEL SYNDROME 04/12/2008  . ANEMIA, IRON DEFICIENCY, HX OF 04/12/2008  . ESOPHAGITIS, HX OF 04/12/2008   Gorden Harms. Monteen Toops, PT, DPT [phone]  Nautica Hotz 09/27/2015, 2:13 PM  [place] MAIN [facility] SERVICES [address] [place], [place], [address] Phone: [phone]   Fax:  [phone]  Name:  Jill Cook MRN: [id number] Date of Birth: 21-Aug-1965

## 2015-09-29 ENCOUNTER — Ambulatory Visit: Payer: 59 | Attending: Orthopedic Surgery

## 2015-09-29 DIAGNOSIS — M25561 Pain in right knee: Secondary | ICD-10-CM | POA: Diagnosis not present

## 2015-09-29 NOTE — Therapy (Signed)
[place] MAIN [facility] SERVICES [address] [place], [place], [address] Phone: [phone]   Fax:  [phone]  Physical Therapy Treatment  Patient Details  Name: Jill Cook MRN: [id number] Date of Birth: May 26, 1966 Referring Provider: Paralee Cancel  Encounter Date: 09/29/2015      PT End of Session - 09/29/15 1337    Visit Number 7   Number of Visits 13   Date for PT Re-Evaluation 10/06/15   PT Start Time 0845   PT Stop Time 0930   PT Time Calculation (min) 45 min   Activity Tolerance Patient tolerated treatment well   Behavior During Therapy [facility] for tasks assessed/performed      Past Medical History  Diagnosis Date  . Hypertension   . Diverticulosis   . History of esophagitis   . OA (osteoarthritis) of knee     RIGHT  . Arthritis     RIGHT HIP AND WRIST  . Anal fissure   . Anemia   . GERD (gastroesophageal reflux disease)     OTC PRN  . Anxiety     Past Surgical History  Procedure Laterality Date  . Cesarean section  1994  . Anal fissure repair  06-13-2000  . D & c hysteroscopy /  novasure endometrial ablation  11-15-2005  . Knee arthroscopy w/ meniscectomy Right   . Right knee patellofemoral arthroplasty  06-10-2008  . Bunionectomy Right 1991  . Septoplasty  2000  . Transthoracic echocardiogram  06-03-2008    NORMAL /  EF 60%  . Tubal ligation  1996  . Tonsillectomy  AGE 56  . Knee arthroscopy with medial menisectomy Right 10/06/2013    Procedure: RIGHT KNEE ARTHROSCOPY WITH CHONDROPLASTY;  Surgeon: Sydnee Cabal, MD;  Location: [facility];  Service: Orthopedics;  Laterality: Right;  . Conversion to total knee Right 08/22/2015    Procedure: partial knee CONVERSION TO RIGHT TOTAL KNEE;  Surgeon: Paralee Cancel, MD;  Location: [facility];  Service: Orthopedics;  Laterality: Right;    There were no vitals filed for this visit.  Visit Diagnosis:  Right knee pain      Subjective Assessment -  09/29/15 1335    Subjective pt reports her knee has felt a lot better since last visit.   Limitations Walking;Standing;Sitting   How long can you sit comfortably? pressure behind leg 5 to 10 min   How long can you walk comfortably? 30 min    Patient Stated Goals go back to gym, get back to work   Currently in Pain? Yes   Pain Score 2    Pain Location Knee   Pain Orientation Right   Pain Onset 1 to 4 weeks ago        NUSTEP for LE AAROM seat level 8 x 5 min with 10s holds for R knee flexion Standing resisted hip flexion, abduction ,adduction and extension with yellow       Band 2x 10 each way  Fwd/side lunge on BOSU 1x10 each RLE only Pt requires min verbal and tactile cues for proper exercise performance   Manual therapy STM/MFR to medial and lateral knee, scar massage AP/PA tib/femur mobilizations grade III-IV 3 bouts 30s each Proximal tib/fib mob AP/PA grade 3 20s x 2    PROM at end range knee extension and flexion in supine grade 4 oscillations at end range 15s x 5 each Seated knee flexion PROM with small oscillations at end range x 4  ROM today 7-100deg  ice with elevation to R knee at end of session x 10 min no charge                            PT Long Term Goals - 09/08/15 1710    PT LONG TERM GOAL #1   Title pt will have increase R knee flexion AROM to 120 degrees for to descend stairs.   Baseline 70deg   Time 6   Period Weeks   Status New   PT LONG TERM GOAL #2   Title pt will have a decrease in knee pain <2/10 in order to sleep thorugh the night without waking   Time 6   Period Weeks   Status New   PT LONG TERM GOAL #3   Title pt will be independent in HEP to improve strength, mobility and decrease pain for better functional independence with ADLs   Time 6   Period Weeks   Status New   PT LONG TERM GOAL #4   Title pt will improve LEFS socre by 9 pts demonstrating a decrease in disability   Baseline 23   Time 6   Period  Weeks   Status New               Plan - 09/29/15 1338    Clinical Impression Statement pt continues to do well. PT emphasis on improving knee extension more so than flexion now. pts pain and mobility are improving gradually.    Pt will benefit from skilled therapeutic intervention in order to improve on the following deficits Abnormal gait;Decreased endurance;Decreased mobility;Decreased range of motion;Decreased strength;Decreased activity tolerance;Increased edema   Rehab Potential Excellent   Clinical Impairments Affecting Rehab Potential prior knee surgery   PT Frequency 2x / week   PT Duration 6 weeks   PT Treatment/Interventions ADLs/Self Care Home Management;Cryotherapy;Electrical Stimulation;Neuromuscular re-education;Balance training;Therapeutic exercise;Therapeutic activities;Functional mobility training;Stair training;Gait training;Manual techniques;Passive range of motion;Dry needling;Taping   PT Home Exercise Plan Heel slide, SAQ, knee extension stretch, SLR   Consulted and Agree with Plan of Care Patient        Problem List Patient Active Problem List   Diagnosis Date Noted  . Obese 08/23/2015  . 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 Edema 08/17/2015  . Generalized anxiety disorder 08/17/2015  . Depression 08/17/2015  . RLQ abdominal pain 03/29/2015  . S/P right knee arthroscopy 10/06/2013  . SPLENOMEGALY 10/15/2008  . ESOPHAGITIS 10/14/2008  . NAUSEA 10/14/2008  . DYSPEPSIA 04/14/2008  . RECTAL FISSURE 04/14/2008  . Abdominal pain, left upper quadrant 04/14/2008  . GERD 04/12/2008  . DUODENITIS 04/12/2008  . IRRITABLE BOWEL SYNDROME 04/12/2008  . ANEMIA, IRON DEFICIENCY, HX OF 04/12/2008  . ESOPHAGITIS, HX OF 04/12/2008   Gorden Harms. Momina Hunton, PT, DPT [phone]  Clearence Vitug 09/29/2015, 1:40 PM  [place] MAIN [facility] SERVICES [address] [place], [place], [address] Phone: [phone]   Fax:   [phone]  Name: Jill Cook MRN: [id number] Date of Birth: [birth year]

## 2015-10-04 ENCOUNTER — Ambulatory Visit: Payer: 59

## 2015-10-04 DIAGNOSIS — M25561 Pain in right knee: Secondary | ICD-10-CM

## 2015-10-04 NOTE — Therapy (Signed)
[place] MAIN [facility] SERVICES [address] [place], [place], [address] Phone: [phone]   Fax:  [phone]  Physical Therapy Treatment  Patient Details  Name: Jill Cook MRN: [id number] Date of Birth: [birth year] Referring Provider: Paralee Cancel  Encounter Date: 10/04/2015      PT End of Session - 10/04/15 1426    Visit Number 8   Number of Visits 13   Date for PT Re-Evaluation 11/01/15   PT Start Time 1115   PT Stop Time 1204   PT Time Calculation (min) 49 min   Activity Tolerance Patient tolerated treatment well   Behavior During Therapy [facility] for tasks assessed/performed      Past Medical History  Diagnosis Date  . Hypertension   . Diverticulosis   . History of esophagitis   . OA (osteoarthritis) of knee     RIGHT  . Arthritis     RIGHT HIP AND WRIST  . Anal fissure   . Anemia   . GERD (gastroesophageal reflux disease)     OTC PRN  . Anxiety     Past Surgical History  Procedure Laterality Date  . Cesarean section  1994  . Anal fissure repair  06-13-2000  . D & c hysteroscopy /  novasure endometrial ablation  11-15-2005  . Knee arthroscopy w/ meniscectomy Right   . Right knee patellofemoral arthroplasty  06-10-2008  . Bunionectomy Right 1991  . Septoplasty  2000  . Transthoracic echocardiogram  06-03-2008    NORMAL /  EF 60%  . Tubal ligation  1996  . Tonsillectomy  AGE 50  . Knee arthroscopy with medial menisectomy Right 10/06/2013    Procedure: RIGHT KNEE ARTHROSCOPY WITH CHONDROPLASTY;  Surgeon: Sydnee Cabal, MD;  Location: [facility];  Service: Orthopedics;  Laterality: Right;  . Conversion to total knee Right 08/22/2015    Procedure: partial knee CONVERSION TO RIGHT TOTAL KNEE;  Surgeon: Paralee Cancel, MD;  Location: [facility];  Service: Orthopedics;  Laterality: Right;    There were no vitals filed for this visit.  Visit Diagnosis:  Right knee pain      Subjective Assessment -  10/04/15 1423    Subjective pt reports she feels like "has turned the corner". she reports having little pain and is able to do a lot more with the R LE.    Limitations Walking;Standing;Sitting   How long can you sit comfortably? pressure behind leg 5 to 10 min   How long can you walk comfortably? 30 min    Patient Stated Goals go back to gym, get back to work   Currently in Pain? Yes   Pain Score 1    Pain Location --  R knee   Pain Onset 1 to 4 weeks ago        Manual therapy STM/MFR to medial and lateral knee, scar massage AP/PA tib/femur mobilizations grade III-IV 3 bouts 30s each Proximal tib/fib mob AP/PA grade 3 20s x 2  Patellar mobs   PROM at end range knee extension and flexion in supine grade 4 oscillations at end range 15s x 5 each ROM today 5-103deg  Therex: Tball squat on wall 2x10 TKE with yellow band 2x10 Standing resisted hip flexion, abduction ,adduction and extension with  yellow      Band x 10 each way (standing on RLE) Side eccentric step down 4inches 2x5 Standing on BOSU flat top up with PA/SS weight shift Mini squat on BOSU x 10 Pt  requires min verbal and tactile cues for proper exercise performance     ice with elevation to R knee at end of session x 10 min no charge                                            PT Education - 10/04/15 1426    Education provided Yes   Education Details eccentric knee control    Person(s) Educated Patient   Methods Explanation   Comprehension Verbalized understanding             PT Long Term Goals - 10/04/15 1430    PT LONG TERM GOAL #1   Title pt will have increase R knee flexion AROM to 120 degrees for to descend stairs.   Baseline 5-103deg   Time 6   Period Weeks   Status Partially Met   PT LONG TERM GOAL #2   Title pt will have a decrease in knee pain <2/10 in order to sleep thorugh the night without waking   Time 6   Period Weeks   Status Partially Met   PT  LONG TERM GOAL #3   Title pt will be independent in HEP to improve strength, mobility and decrease pain for better functional independence with ADLs   Time 6   Period Weeks   Status Achieved   PT LONG TERM GOAL #4   Title pt will improve LEFS socre by 9 pts demonstrating a decrease in disability   Baseline 23   Time 6   Period Weeks   Status Achieved   PT LONG TERM GOAL #5   Title pt will be able to stand for 1 hr without increased pain to be able to return to work    Time 4   Period Weeks   Status New               Plan - 10/04/15 1429    Clinical Impression Statement pt is making excellent progress towards goals,.pts mobility, ROM, pain and strength have all improved. pt would benefit from continued skilled PT services to achieve goals and return to PLOF including work.    Pt will benefit from skilled therapeutic intervention in order to improve on the following deficits Abnormal gait;Decreased endurance;Decreased mobility;Decreased range of motion;Decreased strength;Decreased activity tolerance;Increased edema   Rehab Potential Excellent   Clinical Impairments Affecting Rehab Potential prior knee surgery   PT Frequency 2x / week   PT Duration 6 weeks   PT Treatment/Interventions ADLs/Self Care Home Management;Cryotherapy;Electrical Stimulation;Neuromuscular re-education;Balance training;Therapeutic exercise;Therapeutic activities;Functional mobility training;Stair training;Gait training;Manual techniques;Passive range of motion;Dry needling;Taping   PT Home Exercise Plan Heel slide, SAQ, knee extension stretch, SLR   Consulted and Agree with Plan of Care Patient        Problem List Patient Active Problem List   Diagnosis Date Noted  . Obese 08/23/2015  . 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 Edema 08/17/2015  . Generalized anxiety disorder 08/17/2015  . Depression 08/17/2015  . RLQ abdominal pain 03/29/2015  . S/P right knee arthroscopy 10/06/2013   . SPLENOMEGALY 10/15/2008  . ESOPHAGITIS 10/14/2008  . NAUSEA 10/14/2008  . DYSPEPSIA 04/14/2008  . RECTAL FISSURE 04/14/2008  . Abdominal pain, left upper quadrant 04/14/2008  . GERD 04/12/2008  . DUODENITIS 04/12/2008  . IRRITABLE BOWEL SYNDROME 04/12/2008  . ANEMIA, IRON DEFICIENCY, HX OF 04/12/2008  .  ESOPHAGITIS, HX OF 04/12/2008   Gorden Harms. Carlea Badour, PT, DPT [phone]  Mckenlee Mangham 10/04/2015, 2:32 PM  [place] MAIN [facility] SERVICES [address] [place], [place], [address] Phone: [phone]   Fax:  [phone]  Name: Jill Cook MRN: [id number] Date of Birth: 13-Apr-1966

## 2015-10-05 DIAGNOSIS — Z96651 Presence of right artificial knee joint: Secondary | ICD-10-CM | POA: Diagnosis not present

## 2015-10-05 DIAGNOSIS — T84012D Broken internal right knee prosthesis, subsequent encounter: Secondary | ICD-10-CM | POA: Diagnosis not present

## 2015-10-05 DIAGNOSIS — Z471 Aftercare following joint replacement surgery: Secondary | ICD-10-CM | POA: Diagnosis not present

## 2015-10-06 ENCOUNTER — Ambulatory Visit: Payer: 59

## 2015-10-06 DIAGNOSIS — M25561 Pain in right knee: Secondary | ICD-10-CM

## 2015-10-06 NOTE — Therapy (Signed)
[place] MAIN [facility] SERVICES [address] [place], [place], [address] Phone: [phone]   Fax:  [phone]  Physical Therapy Treatment  Patient Details  Name: Jill Cook MRN: [id number] Date of Birth: 05-13-1966 Referring Provider: Paralee Cancel  Encounter Date: 10/06/2015      PT End of Session - 10/06/15 1201    Visit Number 9   Number of Visits 13   Date for PT Re-Evaluation 11/01/15   PT Start Time 1115   PT Stop Time 1205   PT Time Calculation (min) 50 min   Activity Tolerance Patient tolerated treatment well   Behavior During Therapy [facility] for tasks assessed/performed      Past Medical History  Diagnosis Date  . Hypertension   . Diverticulosis   . History of esophagitis   . OA (osteoarthritis) of knee     RIGHT  . Arthritis     RIGHT HIP AND WRIST  . Anal fissure   . Anemia   . GERD (gastroesophageal reflux disease)     OTC PRN  . Anxiety     Past Surgical History  Procedure Laterality Date  . Cesarean section  1994  . Anal fissure repair  06-13-2000  . D & c hysteroscopy /  novasure endometrial ablation  11-15-2005  . Knee arthroscopy w/ meniscectomy Right   . Right knee patellofemoral arthroplasty  06-10-2008  . Bunionectomy Right 1991  . Septoplasty  2000  . Transthoracic echocardiogram  06-03-2008    NORMAL /  EF 60%  . Tubal ligation  1996  . Tonsillectomy  AGE 50  . Knee arthroscopy with medial menisectomy Right 10/06/2013    Procedure: RIGHT KNEE ARTHROSCOPY WITH CHONDROPLASTY;  Surgeon: Sydnee Cabal, MD;  Location: [facility];  Service: Orthopedics;  Laterality: Right;  . Conversion to total knee Right 08/22/2015    Procedure: partial knee CONVERSION TO RIGHT TOTAL KNEE;  Surgeon: Paralee Cancel, MD;  Location: [facility];  Service: Orthopedics;  Laterality: Right;    There were no vitals filed for this visit.  Visit Diagnosis:  Right knee pain      Subjective Assessment -  10/06/15 1200    Subjective pt reports she had follow up with the surgeon. she reports he wants her to have 120 deg flexion in 2 weeks or he will manipulate the knee. pt was upset by this, she feels she is makimg progress.    Limitations Walking;Standing;Sitting   How long can you sit comfortably? pressure behind leg 5 to 10 min   How long can you walk comfortably? 30 min    Patient Stated Goals go back to gym, get back to work   Currently in Pain? Yes   Pain Score 2    Pain Location --  R knee   Pain Onset 1 to 4 weeks ago        Therex: Rec Bike x5 minutes for increased R knee ROM BOSU ball (black side): L/R weight shifting, F/B weight shifting, squatting 2x10 each BOSU ball (blue side): forward lunges R LE and side lunges R LE x10 each Pt requires min verbal and tactile cues for proper exercise performance   Manual: Patellar mobilizations inferior/superior grade 3 2 x 20s  R knee mobilizations AP and PA grades III and IV for 3x30 Passive R knee ROM into end range flexion /extension with small oscillations at end range x 20s  X 8 reps each.  Pt had improved joint mobility following  mobilzations R knee PROM 4 to 110 degrees following manual therapy in both sitting and supine                            PT Education - 10/06/15 1158    Education provided Yes   Education Details Seated self knee flexion stretching with AAROM   Person(s) Educated Patient   Methods Explanation   Comprehension Verbalized understanding             PT Long Term Goals - 10/04/15 1430    PT LONG TERM GOAL #1   Title pt will have increase R knee flexion AROM to 120 degrees for to descend stairs.   Baseline 5-103deg   Time 6   Period Weeks   Status Partially Met   PT LONG TERM GOAL #2   Title pt will have a decrease in knee pain <2/10 in order to sleep thorugh the night without waking   Time 6   Period Weeks   Status Partially Met   PT LONG TERM GOAL #3   Title pt will  be independent in HEP to improve strength, mobility and decrease pain for better functional independence with ADLs   Time 6   Period Weeks   Status Achieved   PT LONG TERM GOAL #4   Title pt will improve LEFS socre by 9 pts demonstrating a decrease in disability   Baseline 23   Time 6   Period Weeks   Status Achieved   PT LONG TERM GOAL #5   Title pt will be able to stand for 1 hr without increased pain to be able to return to work    Time 4   Period Weeks   Status New               Plan - 10/06/15 1201    Clinical Impression Statement pt continues to progress ROM well today achieving 4-110 deg. pt was quite discouraged with her MD follow up. PT educated pt that she is consistantly making progress and should reach the goal within the expected time frame.    Pt will benefit from skilled therapeutic intervention in order to improve on the following deficits Abnormal gait;Decreased endurance;Decreased mobility;Decreased range of motion;Decreased strength;Decreased activity tolerance;Increased edema   Rehab Potential Excellent   Clinical Impairments Affecting Rehab Potential prior knee surgery   PT Frequency 2x / week   PT Duration 6 weeks   PT Treatment/Interventions ADLs/Self Care Home Management;Cryotherapy;Electrical Stimulation;Neuromuscular re-education;Balance training;Therapeutic exercise;Therapeutic activities;Functional mobility training;Stair training;Gait training;Manual techniques;Passive range of motion;Dry needling;Taping   PT Home Exercise Plan Heel slide, SAQ, knee extension stretch, SLR   Consulted and Agree with Plan of Care Patient        Problem List Patient Active Problem List   Diagnosis Date Noted  . Obese 08/23/2015  . 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 Edema 08/17/2015  . Generalized anxiety disorder 08/17/2015  . Depression 08/17/2015  . RLQ abdominal pain 03/29/2015  . S/P right knee arthroscopy 10/06/2013  . SPLENOMEGALY  10/15/2008  . ESOPHAGITIS 10/14/2008  . NAUSEA 10/14/2008  . DYSPEPSIA 04/14/2008  . RECTAL FISSURE 04/14/2008  . Abdominal pain, left upper quadrant 04/14/2008  . GERD 04/12/2008  . DUODENITIS 04/12/2008  . IRRITABLE BOWEL SYNDROME 04/12/2008  . ANEMIA, IRON DEFICIENCY, HX OF 04/12/2008  . ESOPHAGITIS, HX OF 04/12/2008   Gorden Harms. Kambree Krauss, PT, DPT #[id number]  Znya Albino 10/06/2015, 12:03 PM  [place] MAIN [facility] SERVICES [address] [place], [place], [address] Phone: [phone]   Fax:  [phone]  Name: Jill Cook MRN: [id number] Date of Birth: 02/04/1966

## 2015-10-11 ENCOUNTER — Ambulatory Visit: Payer: 59

## 2015-10-11 DIAGNOSIS — M25561 Pain in right knee: Secondary | ICD-10-CM | POA: Diagnosis not present

## 2015-10-11 NOTE — Therapy (Addendum)
[place] MAIN [facility] SERVICES [address] [place], [place], [address] Phone: [phone]   Fax:  [phone]  Physical Therapy Treatment  Patient Details  Name: Jill Cook MRN: [id number] Date of Birth: 05/08/1966 Referring Provider: Paralee Cancel  Encounter Date: 10/11/2015      PT End of Session - 10/11/15 1423    Visit Number 10   Number of Visits 13   Date for PT Re-Evaluation 11/01/15   PT Start Time 1115   PT Stop Time 1205   PT Time Calculation (min) 50 min   Activity Tolerance Patient tolerated treatment well   Behavior During Therapy [facility] for tasks assessed/performed      Past Medical History  Diagnosis Date  . Hypertension   . Diverticulosis   . History of esophagitis   . OA (osteoarthritis) of knee     RIGHT  . Arthritis     RIGHT HIP AND WRIST  . Anal fissure   . Anemia   . GERD (gastroesophageal reflux disease)     OTC PRN  . Anxiety     Past Surgical History  Procedure Laterality Date  . Cesarean section  1994  . Anal fissure repair  06-13-2000  . D & c hysteroscopy /  novasure endometrial ablation  11-15-2005  . Knee arthroscopy w/ meniscectomy Right   . Right knee patellofemoral arthroplasty  06-10-2008  . Bunionectomy Right 1991  . Septoplasty  2000  . Transthoracic echocardiogram  06-03-2008    NORMAL /  EF 60%  . Tubal ligation  1996  . Tonsillectomy  AGE 42  . Knee arthroscopy with medial menisectomy Right 10/06/2013    Procedure: RIGHT KNEE ARTHROSCOPY WITH CHONDROPLASTY;  Surgeon: Sydnee Cabal, MD;  Location: [facility];  Service: Orthopedics;  Laterality: Right;  . Conversion to total knee Right 08/22/2015    Procedure: partial knee CONVERSION TO RIGHT TOTAL KNEE;  Surgeon: Paralee Cancel, MD;  Location: [facility];  Service: Orthopedics;  Laterality: Right;    There were no vitals filed for this visit.  Visit Diagnosis:  Right knee pain      Subjective Assessment -  10/11/15 1421    Subjective pt reports she has been working hard on her R knee ROM and has been practicing stairs.   Limitations Walking;Standing;Sitting   How long can you sit comfortably? pressure behind leg 5 to 10 min   How long can you walk comfortably? 30 min    Patient Stated Goals go back to gym, get back to work   Currently in Pain? Yes   Pain Score 1    Pain Location --  R knee   Pain Onset 1 to 4 weeks ago      Manual therapy: Scar mobs x 2 min Patellar mobilizations inferior/superior grade 3 2 x 20s  R knee mobilizations AP and PA grades III and IV for 3x30 Passive R knee ROM into end range flexion /extension with small oscillations at end range x 20s X 10 reps each.  Pt had improved joint mobility following mobilzations R knee PROM 0 to 115 degrees following manual therapy in both sitting and supine   Therex: Rec. Bike for AAROM x 3 min  TKE with ball on wall 2x10 Single leg R leg press 75lbs 2x10 with manual assist/PROM to improve knee extension   Standing resisted hip flexion, abduction ,adduction and extension with   yellow     Band x 12 each way  lateral eccentric step down 4inches 2x10 Monster walk: green band 15f x 2 laps Side stepping with green band 183fx 2 laps  Pt requires min verbal and tactile cues for proper exercise performance       session followed by cold pack to R knee in supine at no charge x 10 min                   PT Education - 10/11/15 1422    Education provided Yes   Education Details pt encouraged she is making good progress   Person(s) Educated Patient   Methods Explanation   Comprehension Verbalized understanding             PT Long Term Goals - 10/04/15 1430    PT LONG TERM GOAL #1   Title pt will have increase R knee flexion AROM to 120 degrees for to descend stairs.   Baseline 5-103deg   Time 6   Period Weeks   Status Partially Met   PT LONG TERM GOAL #2   Title pt will have a decrease in knee  pain <2/10 in order to sleep thorugh the night without waking   Time 6   Period Weeks   Status Partially Met   PT LONG TERM GOAL #3   Title pt will be independent in HEP to improve strength, mobility and decrease pain for better functional independence with ADLs   Time 6   Period Weeks   Status Achieved   PT LONG TERM GOAL #4   Title pt will improve LEFS socre by 9 pts demonstrating a decrease in disability   Baseline 23   Time 6   Period Weeks   Status Achieved   PT LONG TERM GOAL #5   Title pt will be able to stand for 1 hr without increased pain to be able to return to work    Time 4   Period Weeks   Status New               Plan - 10/11/15 1423    Clinical Impression Statement pt was able to again improve her ROM in session today despite having some increased hamstring tightness. hamstring tightness was greatly reduced with MET and sciatic neural glides. pts PROM today was 0-115 deg. AROM was 5-113 deg following therapy. pt also tolerated progression of hip strengthening well.    Pt will benefit from skilled therapeutic intervention in order to improve on the following deficits Abnormal gait;Decreased endurance;Decreased mobility;Decreased range of motion;Decreased strength;Decreased activity tolerance;Increased edema   Rehab Potential Excellent   Clinical Impairments Affecting Rehab Potential prior knee surgery   PT Frequency 2x / week   PT Duration 6 weeks   PT Treatment/Interventions ADLs/Self Care Home Management;Cryotherapy;Electrical Stimulation;Neuromuscular re-education;Balance training;Therapeutic exercise;Therapeutic activities;Functional mobility training;Stair training;Gait training;Manual techniques;Passive range of motion;Dry needling;Taping   PT Home Exercise Plan Heel slide, SAQ, knee extension stretch, SLR   Consulted and Agree with Plan of Care Patient        Problem List Patient Active Problem List   Diagnosis Date Noted  . Obese 08/23/2015  .  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 Edema 08/17/2015  . Generalized anxiety disorder 08/17/2015  . Depression 08/17/2015  . RLQ abdominal pain 03/29/2015  . S/P right knee arthroscopy 10/06/2013  . SPLENOMEGALY 10/15/2008  . ESOPHAGITIS 10/14/2008  . NAUSEA 10/14/2008  . DYSPEPSIA 04/14/2008  . RECTAL FISSURE 04/14/2008  . Abdominal pain, left upper quadrant 04/14/2008  .  GERD 04/12/2008  . DUODENITIS 04/12/2008  . IRRITABLE BOWEL SYNDROME 04/12/2008  . ANEMIA, IRON DEFICIENCY, HX OF 04/12/2008  . ESOPHAGITIS, HX OF 04/12/2008   Gorden Harms. Ayrabella Labombard, PT, DPT [phone]  Jill Cook 10/11/2015, 2:24 PM  [place] MAIN [facility] SERVICES [address] [place], [place], [address] Phone: [phone]   Fax:  [phone]  Name: Jill Cook MRN: [id number] Date of Birth: 1965/08/30

## 2015-10-13 ENCOUNTER — Ambulatory Visit: Payer: 59

## 2015-10-13 DIAGNOSIS — R1032 Left lower quadrant pain: Secondary | ICD-10-CM | POA: Diagnosis not present

## 2015-10-13 DIAGNOSIS — M25561 Pain in right knee: Secondary | ICD-10-CM

## 2015-10-13 NOTE — Therapy (Signed)
[place] MAIN [facility] SERVICES [address] [place], [place], [address] Phone: [phone]   Fax:  [phone]  Physical Therapy Treatment  Patient Details  Name: Jill Cook MRN: [id number] Date of Birth: 27-Jun-1966 Referring Provider: Paralee Cancel  Encounter Date: 10/13/2015      PT End of Session - 10/13/15 1256    Visit Number 11   Number of Visits 13   Date for PT Re-Evaluation 11/01/15   PT Start Time 1110   PT Stop Time 1200   PT Time Calculation (min) 50 min   Activity Tolerance Patient tolerated treatment well   Behavior During Therapy [facility] for tasks assessed/performed      Past Medical History  Diagnosis Date  . Hypertension   . Diverticulosis   . History of esophagitis   . OA (osteoarthritis) of knee     RIGHT  . Arthritis     RIGHT HIP AND WRIST  . Anal fissure   . Anemia   . GERD (gastroesophageal reflux disease)     OTC PRN  . Anxiety     Past Surgical History  Procedure Laterality Date  . Cesarean section  1994  . Anal fissure repair  06-13-2000  . D & c hysteroscopy /  novasure endometrial ablation  11-15-2005  . Knee arthroscopy w/ meniscectomy Right   . Right knee patellofemoral arthroplasty  06-10-2008  . Bunionectomy Right 1991  . Septoplasty  2000  . Transthoracic echocardiogram  06-03-2008    NORMAL /  EF 60%  . Tubal ligation  1996  . Tonsillectomy  AGE 50  . Knee arthroscopy with medial menisectomy Right 10/06/2013    Procedure: RIGHT KNEE ARTHROSCOPY WITH CHONDROPLASTY;  Surgeon: Sydnee Cabal, MD;  Location: [facility];  Service: Orthopedics;  Laterality: Right;  . Conversion to total knee Right 08/22/2015    Procedure: partial knee CONVERSION TO RIGHT TOTAL KNEE;  Surgeon: Paralee Cancel, MD;  Location: [facility];  Service: Orthopedics;  Laterality: Right;    There were no vitals filed for this visit.  Visit Diagnosis:  Right knee pain      Subjective Assessment -  10/13/15 1254    Subjective pt reports her knee feels tight    Limitations Walking;Standing;Sitting   How long can you sit comfortably? pressure behind leg 5 to 10 min   How long can you walk comfortably? 30 min    Patient Stated Goals go back to gym, get back to work   Currently in Pain? No/denies   Pain Score 0-No pain   Pain Onset 1 to 4 weeks ago      Manual therapy: Manual therapy: Scar mobs x 2 min Patellar mobilizations inferior/superior grade 3 2 x 20s  R knee mobilizations AP and PA grades III and IV for 3x30 Passive R knee ROM into end range flexion /extension with small oscillations at end range x 20s X 10 reps each.  Pt had improved joint mobility following mobilzations R knee PROM 3 to 117 degrees following manual therapy in both sitting and supine   Therex:  Rec bike x 3 min no charge Tball squat 2x10 Eccentric side step downs 4" 2x10 TKE with red band 2x10 Standing resisted hip flexion, abduction ,adduction and extension with   red     Band x 10 each way  Monster walk green band 2x90mSide steps green band 2x249mt requires min verbal and tactile cues for proper exercise performance   Cold  pack to R knee x 10 min no charge                         PT Education - 10/13/15 1255    Education provided Yes   Education Details side stepping with green band at home   Person(s) Educated Patient   Methods Explanation   Comprehension Verbalized understanding             PT Long Term Goals - 10/04/15 1430    PT LONG TERM GOAL #1   Title pt will have increase R knee flexion AROM to 120 degrees for to descend stairs.   Baseline 5-103deg   Time 6   Period Weeks   Status Partially Met   PT LONG TERM GOAL #2   Title pt will have a decrease in knee pain <2/10 in order to sleep thorugh the night without waking   Time 6   Period Weeks   Status Partially Met   PT LONG TERM GOAL #3   Title pt will be independent in HEP to improve strength,  mobility and decrease pain for better functional independence with ADLs   Time 6   Period Weeks   Status Achieved   PT LONG TERM GOAL #4   Title pt will improve LEFS socre by 9 pts demonstrating a decrease in disability   Baseline 23   Time 6   Period Weeks   Status Achieved   PT LONG TERM GOAL #5   Title pt will be able to stand for 1 hr without increased pain to be able to return to work    Time 4   Period Weeks   Status New               Plan - 10/13/15 1256    Clinical Impression Statement pt continues to progress well regarding all elements of her therapy. her home mobility and function are improving as well.    Pt will benefit from skilled therapeutic intervention in order to improve on the following deficits Abnormal gait;Decreased endurance;Decreased mobility;Decreased range of motion;Decreased strength;Decreased activity tolerance;Increased edema   Rehab Potential Excellent   Clinical Impairments Affecting Rehab Potential prior knee surgery   PT Frequency 2x / week   PT Duration 6 weeks   PT Treatment/Interventions ADLs/Self Care Home Management;Cryotherapy;Electrical Stimulation;Neuromuscular re-education;Balance training;Therapeutic exercise;Therapeutic activities;Functional mobility training;Stair training;Gait training;Manual techniques;Passive range of motion;Dry needling;Taping   PT Home Exercise Plan Heel slide, SAQ, knee extension stretch, SLR   Consulted and Agree with Plan of Care Patient        Problem List Patient Active Problem List   Diagnosis Date Noted  . Obese 08/23/2015  . 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 Edema 08/17/2015  . Generalized anxiety disorder 08/17/2015  . Depression 08/17/2015  . RLQ abdominal pain 03/29/2015  . S/P right knee arthroscopy 10/06/2013  . SPLENOMEGALY 10/15/2008  . ESOPHAGITIS 10/14/2008  . NAUSEA 10/14/2008  . DYSPEPSIA 04/14/2008  . RECTAL FISSURE 04/14/2008  . Abdominal pain, left upper  quadrant 04/14/2008  . GERD 04/12/2008  . DUODENITIS 04/12/2008  . IRRITABLE BOWEL SYNDROME 04/12/2008  . ANEMIA, IRON DEFICIENCY, HX OF 04/12/2008  . ESOPHAGITIS, HX OF 04/12/2008   Gorden Harms. Tortorici, PT, DPT [phone]  Tortorici,Ashley 10/13/2015, 12:57 PM  [place] MAIN [facility] SERVICES [address] [place], [place], [address] Phone: [phone]   Fax:  [phone]  Name: Jill Cook  MRN: [id number] Date of Birth: 03/04/1966

## 2015-10-15 ENCOUNTER — Telehealth: Payer: 59 | Admitting: Nurse Practitioner

## 2015-10-15 DIAGNOSIS — R059 Cough, unspecified: Secondary | ICD-10-CM

## 2015-10-15 DIAGNOSIS — R05 Cough: Secondary | ICD-10-CM

## 2015-10-15 MED ORDER — AZITHROMYCIN 250 MG PO TABS: ORAL_TABLET | ORAL | Status: DC

## 2015-10-15 NOTE — Progress Notes (Signed)

## 2015-10-18 ENCOUNTER — Ambulatory Visit: Payer: 59

## 2015-10-18 DIAGNOSIS — M25561 Pain in right knee: Secondary | ICD-10-CM

## 2015-10-18 NOTE — Therapy (Signed)
[place] MAIN [facility] SERVICES [address] [place], [place], [address] Phone: [phone]   Fax:  [phone]  Physical Therapy Treatment  Patient Details  Name: Jill Cook MRN: [id number] Date of Birth: 1965-10-06 Referring Provider: Paralee Cancel  Encounter Date: 10/18/2015      PT End of Session - 10/18/15 1259    Visit Number 12   Number of Visits 13   Date for PT Re-Evaluation 11/01/15   PT Start Time 1115   PT Stop Time 1205   PT Time Calculation (min) 50 min   Activity Tolerance Patient tolerated treatment well   Behavior During Therapy [facility] for tasks assessed/performed      Past Medical History  Diagnosis Date  . Hypertension   . Diverticulosis   . History of esophagitis   . OA (osteoarthritis) of knee     RIGHT  . Arthritis     RIGHT HIP AND WRIST  . Anal fissure   . Anemia   . GERD (gastroesophageal reflux disease)     OTC PRN  . Anxiety     Past Surgical History  Procedure Laterality Date  . Cesarean section  1994  . Anal fissure repair  06-13-2000  . D & c hysteroscopy /  novasure endometrial ablation  11-15-2005  . Knee arthroscopy w/ meniscectomy Right   . Right knee patellofemoral arthroplasty  06-10-2008  . Bunionectomy Right 1991  . Septoplasty  2000  . Transthoracic echocardiogram  06-03-2008    NORMAL /  EF 60%  . Tubal ligation  1996  . Tonsillectomy  AGE 50  . Knee arthroscopy with medial menisectomy Right 10/06/2013    Procedure: RIGHT KNEE ARTHROSCOPY WITH CHONDROPLASTY;  Surgeon: Sydnee Cabal, MD;  Location: [facility];  Service: Orthopedics;  Laterality: Right;  . Conversion to total knee Right 08/22/2015    Procedure: partial knee CONVERSION TO RIGHT TOTAL KNEE;  Surgeon: Paralee Cancel, MD;  Location: [facility];  Service: Orthopedics;  Laterality: Right;    There were no vitals filed for this visit.  Visit Diagnosis:  Right knee pain      Subjective Assessment -  10/18/15 1259    Subjective pt reports she is having a lot less stiffness and is happy with progress.    Limitations Walking;Standing;Sitting   How long can you sit comfortably? pressure behind leg 5 to 10 min   How long can you walk comfortably? 30 min    Patient Stated Goals go back to gym, get back to work   Currently in Pain? No/denies   Pain Onset 1 to 4 weeks ago         Therex: Tball wall squat 3s hold x 10 BOSU fwd step up and side step up x 10 each side- light UE support  Standing resisted hip flexion, abduction ,adduction and extension with   red     Band x 15 each way  BOSU flat top up AP/SS wt shift x 3 min then squat  x10 Rec. Bike for ROM x 2 min  manual therapy  Scar mobs x 2 min Patellar mobilizations inferior/superior grade 3 2 x 20s  R knee mobilizations AP and PA grades III and IV for 3x30 Passive R knee ROM into end range flexion /extension with small oscillations at end range x 20s X 10 reps each.  Pt had improved joint mobility following mobilzations R knee PROM 2 to 118 degrees following manual therapy   MET to  R hamstring with contract relax and ankle pumps (neural mobility) x 2 min       session followed by cold pack to R knee . 6 min no charge              PT Long Term Goals - 10/04/15 1430    PT LONG TERM GOAL #1   Title pt will have increase R knee flexion AROM to 120 degrees for to descend stairs.   Baseline 5-103deg   Time 6   Period Weeks   Status Partially Met   PT LONG TERM GOAL #2   Title pt will have a decrease in knee pain <2/10 in order to sleep thorugh the night without waking   Time 6   Period Weeks   Status Partially Met   PT LONG TERM GOAL #3   Title pt will be independent in HEP to improve strength, mobility and decrease pain for better functional independence with ADLs   Time 6   Period Weeks   Status Achieved   PT LONG TERM GOAL #4   Title pt will improve LEFS socre by 9 pts demonstrating a decrease in  disability   Baseline 23   Time 6   Period Weeks   Status Achieved   PT LONG TERM GOAL #5   Title pt will be able to stand for 1 hr without increased pain to be able to return to work    Time 4   Period Weeks   Status New               Plan - 10/18/15 1259    Clinical Impression Statement pts ROM is approaching her PT goals, today PROM following manual therapy was 2-118 deg. pt progressing well with ROM and strengthening activities. pain is controlled. pt will continue to progress strength, ROM, balance and activity tolerance.    Pt will benefit from skilled therapeutic intervention in order to improve on the following deficits Abnormal gait;Decreased endurance;Decreased mobility;Decreased range of motion;Decreased strength;Decreased activity tolerance;Increased edema   Rehab Potential Excellent   Clinical Impairments Affecting Rehab Potential prior knee surgery   PT Frequency 2x / week   PT Duration 6 weeks   PT Treatment/Interventions ADLs/Self Care Home Management;Cryotherapy;Electrical Stimulation;Neuromuscular re-education;Balance training;Therapeutic exercise;Therapeutic activities;Functional mobility training;Stair training;Gait training;Manual techniques;Passive range of motion;Dry needling;Taping   PT Home Exercise Plan Heel slide, SAQ, knee extension stretch, SLR   Consulted and Agree with Plan of Care Patient        Problem List Patient Active Problem List   Diagnosis Date Noted  . Obese 08/23/2015  . 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 Edema 08/17/2015  . Generalized anxiety disorder 08/17/2015  . Depression 08/17/2015  . RLQ abdominal pain 03/29/2015  . S/P right knee arthroscopy 10/06/2013  . SPLENOMEGALY 10/15/2008  . ESOPHAGITIS 10/14/2008  . NAUSEA 10/14/2008  . DYSPEPSIA 04/14/2008  . RECTAL FISSURE 04/14/2008  . Abdominal pain, left upper quadrant 04/14/2008  . GERD 04/12/2008  . DUODENITIS 04/12/2008  . IRRITABLE BOWEL SYNDROME  04/12/2008  . ANEMIA, IRON DEFICIENCY, HX OF 04/12/2008  . ESOPHAGITIS, HX OF 04/12/2008   Gorden Harms. Olanrewaju Osborn, PT, DPT [phone]  Aizlyn Schifano 10/18/2015, 1:01 PM  [place] MAIN [facility] SERVICES [address] [place], [place], [address] Phone: [phone]   Fax:  [phone]  Name: Jill Cook MRN: [id number] Date of Birth: [birth year]

## 2015-10-19 ENCOUNTER — Telehealth: Payer: 59 | Admitting: Family

## 2015-10-19 DIAGNOSIS — R0602 Shortness of breath: Secondary | ICD-10-CM

## 2015-10-19 DIAGNOSIS — R05 Cough: Secondary | ICD-10-CM | POA: Diagnosis not present

## 2015-10-19 DIAGNOSIS — R059 Cough, unspecified: Secondary | ICD-10-CM

## 2015-10-19 MED ORDER — BENZONATATE 100 MG PO CAPS: 100.0000 mg | ORAL_CAPSULE | Freq: Three times a day (TID) | ORAL | Status: DC | PRN

## 2015-10-19 MED ORDER — ALBUTEROL SULFATE HFA 108 (90 BASE) MCG/ACT IN AERS: 2.0000 | INHALATION_SPRAY | Freq: Four times a day (QID) | RESPIRATORY_TRACT | Status: DC | PRN

## 2015-10-19 MED ORDER — OXYMETAZOLINE HCL 0.05 % NA SOLN: 1.0000 | Freq: Two times a day (BID) | NASAL | Status: DC

## 2015-10-19 NOTE — Progress Notes (Signed)
We are sorry that you are not feeling well.  Here is how we plan to help!  Based on what you have shared with me it looks like you have upper respiratory tract inflammation that has resulted in a significant cough.  Inflammation and infection in the upper respiratory tract is commonly called bronchitis and has four common causes:  Allergies, Viral Infections, Acid Reflux and Bacterial Infections.  Allergies, viruses and acid reflux are treated by controlling symptoms or eliminating the cause. An example might be a cough caused by taking certain blood pressure medications. You stop the cough by changing the medication. Another example might be a cough caused by acid reflux. Controlling the reflux helps control the cough.  Based on your presentation I believe you most likely have A cough due to a virus.  This is called viral bronchitis and is best treated by rest, plenty of fluids and control of the cough.  You may use Ibuprofen or Tylenol as directed to help your symptoms.    In addition you may use A non-prescription cough medication called Mucinex DM: take 2 tablets every 12 hours. and A prescription cough medication called Tessalon Perles 100mg . You may take 1-2 capsules every 8 hours as needed for your cough.   I have also prescribed an Albuterol (Ventolin) inhaler, take 1-2 puffs every 4 hours as needed for shortness of breath. For your severe nasal congestion, you may use the Afrin nasal spray 1 spray bilateral nares twice daily but ONLY for 3 days.     HOME CARE . Only take medications as instructed by your medical team. . Complete the entire course of an antibiotic. . Drink plenty of fluids and get plenty of rest. . Avoid close contacts especially the very young and the elderly . Cover your mouth if you cough or cough into your sleeve. . Always remember to wash your hands . A steam or ultrasonic humidifier can help congestion.    GET HELP RIGHT AWAY IF: . You develop worsening  fever. . You become short of breath . You cough up blood. . Your symptoms persist after you have completed your treatment plan MAKE SURE YOU   Understand these instructions.  Will watch your condition.  Will get help right away if you are not doing well or get worse.  Your e-visit answers were reviewed by a board certified advanced clinical practitioner to complete your personal care plan.  Depending on the condition, your plan could have included both over the counter or prescription medications. If there is a problem please reply  once you have received a response from your provider. Your safety is important to [place].  If you have drug allergies check your prescription carefully.    You can use MyChart to ask questions about today's visit, request a non-urgent call back, or ask for a work or school excuse for 24 hours related to this e-Visit. If it has been greater than 24 hours you will need to follow up with your provider, or enter a new e-Visit to address those concerns. You will get an e-mail in the next two days asking about your experience.  I hope that your e-visit has been valuable and will speed your recovery. Thank you for using e-visits.

## 2015-10-20 ENCOUNTER — Ambulatory Visit: Payer: 59

## 2015-10-20 DIAGNOSIS — M25561 Pain in right knee: Secondary | ICD-10-CM | POA: Diagnosis not present

## 2015-10-20 NOTE — Therapy (Signed)
[place] MAIN [facility] SERVICES [address] [place], [place], [address] Phone: [phone]   Fax:  [phone]  Physical Therapy Treatment  Patient Details  Name: NEZZIE MANERA MRN: [id number] Date of Birth: [birth year] Referring Provider: Paralee Cancel  Encounter Date: 10/20/2015      PT End of Session - 10/20/15 1300    Visit Number 13   Number of Visits 13   Date for PT Re-Evaluation 11/01/15   PT Start Time 1110   PT Stop Time 1200   PT Time Calculation (min) 50 min   Activity Tolerance Patient tolerated treatment well   Behavior During Therapy [facility] for tasks assessed/performed      Past Medical History  Diagnosis Date  . Hypertension   . Diverticulosis   . History of esophagitis   . OA (osteoarthritis) of knee     RIGHT  . Arthritis     RIGHT HIP AND WRIST  . Anal fissure   . Anemia   . GERD (gastroesophageal reflux disease)     OTC PRN  . Anxiety     Past Surgical History  Procedure Laterality Date  . Cesarean section  1994  . Anal fissure repair  06-13-2000  . D & c hysteroscopy /  novasure endometrial ablation  11-15-2005  . Knee arthroscopy w/ meniscectomy Right   . Right knee patellofemoral arthroplasty  06-10-2008  . Bunionectomy Right 1991  . Septoplasty  2000  . Transthoracic echocardiogram  06-03-2008    NORMAL /  EF 60%  . Tubal ligation  1996  . Tonsillectomy  AGE 50  . Knee arthroscopy with medial menisectomy Right 10/06/2013    Procedure: RIGHT KNEE ARTHROSCOPY WITH CHONDROPLASTY;  Surgeon: Sydnee Cabal, MD;  Location: [facility];  Service: Orthopedics;  Laterality: Right;  . Conversion to total knee Right 08/22/2015    Procedure: partial knee CONVERSION TO RIGHT TOTAL KNEE;  Surgeon: Paralee Cancel, MD;  Location: [facility];  Service: Orthopedics;  Laterality: Right;    There were no vitals filed for this visit.  Visit Diagnosis:  Right knee pain      Subjective Assessment -  10/20/15 1253    Subjective pt reports she has to go back to see the surgeon today. she is working hard on he rROM   Limitations Walking;Standing;Sitting   How long can you sit comfortably? pressure behind leg 5 to 10 min   How long can you walk comfortably? 30 min    Patient Stated Goals go back to gym, get back to work   Currently in Pain? Yes   Pain Score 1    Pain Location --  R knee   Pain Onset 1 to 4 weeks ago             Therex: TKE with blue band 2x10 Standing resisted hip flexion, abduction ,adduction and extension with   red     Band x 10 each way BLE Lateral eccentric step down 2x10 Clock reach 1 min x 3 Pt requires min verbal and tactile cues for proper exercise performance   manual therapy  Scar mobs x 2 min Patellar mobilizations inferior/superior grade 3 2 x 20s  R knee mobilizations AP and PA grades III and IV for 3x30 Passive R knee ROM into end range flexion /extension with small oscillations at end range x 20s X 10 reps each.  Pt had improved joint mobility following mobilzations R knee PROM 2 to 120 degrees following  manual therapy   MET to R hamstring with contract relax and ankle pumps (neural mobility) x 2 min       session followed by cold pack to R knee . 6 min no charge                           PT Long Term Goals - 10/04/15 1430    PT LONG TERM GOAL #1   Title pt will have increase R knee flexion AROM to 120 degrees for to descend stairs.   Baseline 5-103deg   Time 6   Period Weeks   Status Partially Met   PT LONG TERM GOAL #2   Title pt will have a decrease in knee pain <2/10 in order to sleep thorugh the night without waking   Time 6   Period Weeks   Status Partially Met   PT LONG TERM GOAL #3   Title pt will be independent in HEP to improve strength, mobility and decrease pain for better functional independence with ADLs   Time 6   Period Weeks   Status Achieved   PT LONG TERM GOAL #4   Title pt  will improve LEFS socre by 9 pts demonstrating a decrease in disability   Baseline 23   Time 6   Period Weeks   Status Achieved   PT LONG TERM GOAL #5   Title pt will be able to stand for 1 hr without increased pain to be able to return to work    Time 4   Period Weeks   Status New               Plan - 10/20/15 1304    Clinical Impression Statement pt continues to make slow steady gains in her ROM. she had some increased joint stiffness today, but still doing well. PROM today after manual therapy was 2-120deg.    Pt will benefit from skilled therapeutic intervention in order to improve on the following deficits Abnormal gait;Decreased endurance;Decreased mobility;Decreased range of motion;Decreased strength;Decreased activity tolerance;Increased edema   Rehab Potential Excellent   Clinical Impairments Affecting Rehab Potential prior knee surgery   PT Frequency 2x / week   PT Duration 6 weeks   PT Treatment/Interventions ADLs/Self Care Home Management;Cryotherapy;Electrical Stimulation;Neuromuscular re-education;Balance training;Therapeutic exercise;Therapeutic activities;Functional mobility training;Stair training;Gait training;Manual techniques;Passive range of motion;Dry needling;Taping   PT Home Exercise Plan Heel slide, SAQ, knee extension stretch, SLR   Consulted and Agree with Plan of Care Patient        Problem List Patient Active Problem List   Diagnosis Date Noted  . Obese 08/23/2015  . 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 Edema 08/17/2015  . Generalized anxiety disorder 08/17/2015  . Depression 08/17/2015  . RLQ abdominal pain 03/29/2015  . S/P right knee arthroscopy 10/06/2013  . SPLENOMEGALY 10/15/2008  . ESOPHAGITIS 10/14/2008  . NAUSEA 10/14/2008  . DYSPEPSIA 04/14/2008  . RECTAL FISSURE 04/14/2008  . Abdominal pain, left upper quadrant 04/14/2008  . GERD 04/12/2008  . DUODENITIS 04/12/2008  . IRRITABLE BOWEL SYNDROME 04/12/2008  .  ANEMIA, IRON DEFICIENCY, HX OF 04/12/2008  . ESOPHAGITIS, HX OF 04/12/2008   Gorden Harms. Aayushi Solorzano, PT, DPT [phone]  Caliah Kopke 10/20/2015, 1:05 PM  [place] MAIN [facility] SERVICES [address] [place], [place], [address] Phone: [phone]   Fax:  [phone]  Name: LATOSHA GAYLORD MRN: [id number] Date of Birth: 02-04-1966

## 2015-10-25 ENCOUNTER — Ambulatory Visit: Payer: 59

## 2015-10-25 DIAGNOSIS — M25561 Pain in right knee: Secondary | ICD-10-CM | POA: Diagnosis not present

## 2015-10-25 NOTE — Therapy (Signed)
[place] MAIN [facility] SERVICES [address] [place], [place], [address] Phone: [phone]   Fax:  [phone]  Physical Therapy Treatment  Patient Details  Name: Jill Cook MRN: [id number] Date of Birth: [birth year] Referring Provider: Paralee Cancel  Encounter Date: 10/25/2015      PT End of Session - 10/25/15 1255    Visit Number 14   Number of Visits 21   Date for PT Re-Evaluation 11/22/15   PT Start Time 1115   PT Stop Time 1205   PT Time Calculation (min) 50 min   Activity Tolerance Patient tolerated treatment well   Behavior During Therapy [facility] for tasks assessed/performed      Past Medical History  Diagnosis Date  . Hypertension   . Diverticulosis   . History of esophagitis   . OA (osteoarthritis) of knee     RIGHT  . Arthritis     RIGHT HIP AND WRIST  . Anal fissure   . Anemia   . GERD (gastroesophageal reflux disease)     OTC PRN  . Anxiety     Past Surgical History  Procedure Laterality Date  . Cesarean section  1994  . Anal fissure repair  06-13-2000  . D & c hysteroscopy /  novasure endometrial ablation  11-15-2005  . Knee arthroscopy w/ meniscectomy Right   . Right knee patellofemoral arthroplasty  06-10-2008  . Bunionectomy Right 1991  . Septoplasty  2000  . Transthoracic echocardiogram  06-03-2008    NORMAL /  EF 60%  . Tubal ligation  1996  . Tonsillectomy  AGE 3  . Knee arthroscopy with medial menisectomy Right 10/06/2013    Procedure: RIGHT KNEE ARTHROSCOPY WITH CHONDROPLASTY;  Surgeon: Sydnee Cabal, MD;  Location: [facility];  Service: Orthopedics;  Laterality: Right;  . Conversion to total knee Right 08/22/2015    Procedure: partial knee CONVERSION TO RIGHT TOTAL KNEE;  Surgeon: Paralee Cancel, MD;  Location: [facility];  Service: Orthopedics;  Laterality: Right;    There were no vitals filed for this visit.  Visit Diagnosis:  Right knee pain      Subjective Assessment -  10/25/15 1254    Subjective pt reports her MD f/u went well. she will go back in 2 weeks. pt reports he would like her to continue PT x 4 weeks    Limitations Walking;Standing;Sitting   How long can you sit comfortably? pressure behind leg 5 to 10 min   How long can you walk comfortably? 30 min    Patient Stated Goals go back to gym, get back to work   Currently in Pain? Yes   Pain Score 3    Pain Location --  R lateral knee   Pain Onset 1 to 4 weeks ago       manual therapy:  Extensive STM and IAStM using edge tool to calf, hamstring and IT band , effleurage, muscle stripping,  AP/ PA tib/femural mobs grade 4 2 bouts 15s each  Patellar mobs inferior/superior grade 3 3 bouts 10 s each ITB stretch 20s x 3 Piriformis stretch 30s x 2 Hamstring stretch and contract/relax x 3 min  ThereX: Staggered stance RLE behind weight shift with dorsiflexion to LLE                          PT Education - 10/25/15 1255    Education provided Yes   Education Details roll R IT band  Person(s) Educated Patient   Methods Explanation   Comprehension Verbalized understanding             PT Long Term Goals - 10/25/15 1258    PT LONG TERM GOAL #1   Title pt will have increase R knee flexion AROM to 120 degrees for to descend stairs.   Baseline 3-118   Time 6   Period Weeks   Status Partially Met   PT LONG TERM GOAL #2   Title pt will have a decrease in knee pain <2/10 in order to sleep thorugh the night without waking   Time 6   Period Weeks   Status Partially Met   PT LONG TERM GOAL #3   Title pt will be independent in HEP to improve strength, mobility and decrease pain for better functional independence with ADLs   Time 6   Period Weeks   Status Achieved   PT LONG TERM GOAL #4   Title pt will improve LEFS socre by 9 pts demonstrating a decrease in disability   Baseline 23   Time 6   Period Weeks   Status Achieved   PT LONG TERM GOAL #5   Title pt will  be able to stand for 1 hr without increased pain to be able to return to work    Time 4   Period Weeks   Status Partially Met               Plan - 10/25/15 1256    Clinical Impression Statement pt is continuing to make good progress towards PT goals. she stilll lacks terminal knee extension and has pain. today she has increased tension to the R hamstring and IT band. she repsonded well to [facility] and [facility]. pt would benefit from continued skilled PT services to further address ROM, pain, flexibility, strength to return to PLOF.,    Pt will benefit from skilled therapeutic intervention in order to improve on the following deficits Abnormal gait;Decreased endurance;Decreased mobility;Decreased range of motion;Decreased strength;Decreased activity tolerance;Increased edema   Rehab Potential Excellent   Clinical Impairments Affecting Rehab Potential prior knee surgery   PT Frequency 2x / week   PT Duration 4 weeks   PT Treatment/Interventions ADLs/Self Care Home Management;Cryotherapy;Electrical Stimulation;Neuromuscular re-education;Balance training;Therapeutic exercise;Therapeutic activities;Functional mobility training;Stair training;Gait training;Manual techniques;Passive range of motion;Dry needling;Taping   PT Home Exercise Plan Heel slide, SAQ, knee extension stretch, SLR   Consulted and Agree with Plan of Care Patient        Problem List Patient Active Problem List   Diagnosis Date Noted  . Obese 08/23/2015  . 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 Edema 08/17/2015  . Generalized anxiety disorder 08/17/2015  . Depression 08/17/2015  . RLQ abdominal pain 03/29/2015  . S/P right knee arthroscopy 10/06/2013  . SPLENOMEGALY 10/15/2008  . ESOPHAGITIS 10/14/2008  . NAUSEA 10/14/2008  . DYSPEPSIA 04/14/2008  . RECTAL FISSURE 04/14/2008  . Abdominal pain, left upper quadrant 04/14/2008  . GERD 04/12/2008  . DUODENITIS 04/12/2008  . IRRITABLE BOWEL SYNDROME  04/12/2008  . ANEMIA, IRON DEFICIENCY, HX OF 04/12/2008  . ESOPHAGITIS, HX OF 04/12/2008   Gorden Harms. Catlin Aycock, PT, DPT [phone]  Azarian Starace 10/25/2015, 2:20 PM  [place] MAIN [facility] SERVICES [address] [place], [place], [address] Phone: [phone]   Fax:  [phone]  Name: Jill Cook MRN: [id number] Date of Birth: October 04, 1965

## 2015-10-27 ENCOUNTER — Ambulatory Visit: Payer: 59

## 2015-10-27 DIAGNOSIS — R102 Pelvic and perineal pain: Secondary | ICD-10-CM | POA: Diagnosis not present

## 2015-10-27 DIAGNOSIS — M25561 Pain in right knee: Secondary | ICD-10-CM | POA: Diagnosis not present

## 2015-10-27 NOTE — Therapy (Signed)
[place] MAIN [facility] SERVICES [address] [place], [place], [address] Phone: [phone]   Fax:  [phone]  Physical Therapy Treatment  Patient Details  Name: Jill Cook MRN: [id number] Date of Birth: Mar 26, 1966 Referring Provider: Paralee Cancel  Encounter Date: 10/27/2015      PT End of Session - 10/27/15 1357    Visit Number 15   Number of Visits 21   Date for PT Re-Evaluation 11/22/15   PT Start Time 1105   PT Stop Time 1200   PT Time Calculation (min) 55 min   Activity Tolerance Patient tolerated treatment well   Behavior During Therapy [facility] for tasks assessed/performed      Past Medical History  Diagnosis Date  . Hypertension   . Diverticulosis   . History of esophagitis   . OA (osteoarthritis) of knee     RIGHT  . Arthritis     RIGHT HIP AND WRIST  . Anal fissure   . Anemia   . GERD (gastroesophageal reflux disease)     OTC PRN  . Anxiety     Past Surgical History  Procedure Laterality Date  . Cesarean section  1994  . Anal fissure repair  06-13-2000  . D & c hysteroscopy /  novasure endometrial ablation  11-15-2005  . Knee arthroscopy w/ meniscectomy Right   . Right knee patellofemoral arthroplasty  06-10-2008  . Bunionectomy Right 1991  . Septoplasty  2000  . Transthoracic echocardiogram  06-03-2008    NORMAL /  EF 60%  . Tubal ligation  1996  . Tonsillectomy  AGE 50  . Knee arthroscopy with medial menisectomy Right 10/06/2013    Procedure: RIGHT KNEE ARTHROSCOPY WITH CHONDROPLASTY;  Surgeon: Sydnee Cabal, MD;  Location: [facility];  Service: Orthopedics;  Laterality: Right;  . Conversion to total knee Right 08/22/2015    Procedure: partial knee CONVERSION TO RIGHT TOTAL KNEE;  Surgeon: Paralee Cancel, MD;  Location[facility];  Service: Orthopedics;  Laterality: Right;    There were no vitals filed for this visit.  Visit Diagnosis:  Right knee pain      Subjective Assessment -  10/27/15 1356    Subjective pt reports her R leg felt so good after last session she was amazed. it is sore today though   Limitations Walking;Standing;Sitting   How long can you sit comfortably? pressure behind leg 5 to 10 min   How long can you walk comfortably? 30 min    Patient Stated Goals go back to gym, get back to work   Pain Score 3    Pain Location --  R knee   Pain Onset 1 to 4 weeks ago      Manual therapy:  STM and IAStM using edge tool to calf, hamstring and IT band , effleurage, muscle stripping,  AP/ PA tib/femural mobs grade 4 2 bouts 15s each  Patellar mobs inferior/superior grade 3 3 bouts 10 s each ITB stretch 20s x 3 Piriformis stretch 30s x 2 Hamstring stretch and contract/relax x 3 min PROM following manual therapy: 0/120deg  thereX: Resisted walking fwd with march, retro, and side to side 16lbs x 3 laps each. Side stepping with squats Fwd/ side stepping over cones fwd/ and sideways, cues for R knee flexion                       PT Education - 10/27/15 1356    Education provided Yes  Education Details piriformis stretch   Person(s) Educated Patient   Methods Explanation   Comprehension Verbalized understanding             PT Long Term Goals - 10/25/15 1258    PT LONG TERM GOAL #1   Title pt will have increase R knee flexion AROM to 120 degrees for to descend stairs.   Baseline 3-118   Time 6   Period Weeks   Status Partially Met   PT LONG TERM GOAL #2   Title pt will have a decrease in knee pain <2/10 in order to sleep thorugh the night without waking   Time 6   Period Weeks   Status Partially Met   PT LONG TERM GOAL #3   Title pt will be independent in HEP to improve strength, mobility and decrease pain for better functional independence with ADLs   Time 6   Period Weeks   Status Achieved   PT LONG TERM GOAL #4   Title pt will improve LEFS socre by 9 pts demonstrating a decrease in disability   Baseline 23    Time 6   Period Weeks   Status Achieved   PT LONG TERM GOAL #5   Title pt will be able to stand for 1 hr without increased pain to be able to return to work    Time 4   Period Weeks   Status Partially Met               Plan - 10/27/15 1357    Clinical Impression Statement PT focused treatment again today on soft tissue mobilization of the R hamstring and calf. progressed functional strengthening as well today    Pt will benefit from skilled therapeutic intervention in order to improve on the following deficits Abnormal gait;Decreased endurance;Decreased mobility;Decreased range of motion;Decreased strength;Decreased activity tolerance;Increased edema   Rehab Potential Excellent   Clinical Impairments Affecting Rehab Potential prior knee surgery   PT Frequency 2x / week   PT Duration 4 weeks   PT Treatment/Interventions ADLs/Self Care Home Management;Cryotherapy;Electrical Stimulation;Neuromuscular re-education;Balance training;Therapeutic exercise;Therapeutic activities;Functional mobility training;Stair training;Gait training;Manual techniques;Passive range of motion;Dry needling;Taping   PT Home Exercise Plan Heel slide, SAQ, knee extension stretch, SLR   Consulted and Agree with Plan of Care Patient        Problem List Patient Active Problem List   Diagnosis Date Noted  . Obese 08/23/2015  . 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 Edema 08/17/2015  . Generalized anxiety disorder 08/17/2015  . Depression 08/17/2015  . RLQ abdominal pain 03/29/2015  . S/P right knee arthroscopy 10/06/2013  . SPLENOMEGALY 10/15/2008  . ESOPHAGITIS 10/14/2008  . NAUSEA 10/14/2008  . DYSPEPSIA 04/14/2008  . RECTAL FISSURE 04/14/2008  . Abdominal pain, left upper quadrant 04/14/2008  . GERD 04/12/2008  . DUODENITIS 04/12/2008  . IRRITABLE BOWEL SYNDROME 04/12/2008  . ANEMIA, IRON DEFICIENCY, HX OF 04/12/2008  . ESOPHAGITIS, HX OF 04/12/2008   Gorden Harms. Brentyn Seehafer, PT, DPT  [phone]   Brason Berthelot 10/27/2015, 1:58 PM  [place] MAIN [facility] SERVICES [address] [place], [place], [address] Phone: [phone]   Fax:  [phone]  Name: AUDREYANA HUNTSBERRY MRN: [id number] Date of Birth: Nov 29, 1965

## 2015-10-28 ENCOUNTER — Encounter (HOSPITAL_BASED_OUTPATIENT_CLINIC_OR_DEPARTMENT_OTHER): Payer: Self-pay | Admitting: *Deleted

## 2015-10-28 NOTE — Consult Note (Signed)
Jill Cook, Jill Cook             ACCOUNT NO.:  [id number]  MEDICAL RECORD NO.:  [id number]  LOCATION:                                 FACILITY:  PHYSICIAN:  Ralene Bathe. Matthew Saras, M.D.DATE OF BIRTH:  [birth year]  DATE OF CONSULTATION: DATE OF DISCHARGE:                                CONSULTATION   CHIEF COMPLAINT:  Chronic right lower quadrant pain.  HPI:  A 50 year old, G3, P2, 1 set of twins, prior tubal.  Her twins are 71.  Her older child is 17.  She has been struggling with pelvic and right lower quadrant pain over the last 6 months.  She had prior ablation and tubal in 2007, with findings that were normal.  Recent CT done that did not show a significant cause for her pain.  She went on to have colonoscopy recently that was likewise normal.  The pain is mostly right lower quadrant, does not seem to necessarily be related to her period and has no other GI symptoms.  A CT was dated, June 16.  She presents now for diagnostic laparoscopy to rule out adhesions or other causes for the pain.  This procedure including specific risks related to bleeding, infection, adjacent organ injury, possible injury to perform other surgeries such as laparotomy for complications discussed with her which she understands and accepts.  PAST MEDICAL HISTORY:  Allergies:  CODEINE, MORPHINE, DILAUDID.  CURRENT MEDICATIONS:  Lisinopril, HRT.  SURGICAL HISTORY:  Cesarean section, bunionectomy, partial knee replacement, NovaSure ablation, nasal septoplasty.  FAMILY HISTORY:  Significant for headache, heart disease, peptic ulcer disease, thyroid disease, UTI, arthritis, diabetes, hypertension.  SOCIAL HISTORY:  Denies drug or tobacco use, occasional alcohol use. She is married.  Last Pap February 2016, was normal.  PHYSICAL EXAM:  VITAL SIGNS:  Temp 98.2, blood pressure 120/82. HEENT:  Unremarkable. NECK:  Supple without masses. LUNGS:  Clear. CARDIOVASCULAR:  Regular rate and rhythm without  murmurs, rubs, gallops noted. BREASTS:  Without masses. ABDOMEN:  Soft, flat, nontender. PELVIC:  Vulva, vagina, cervix normal.  Uterus mid position, normal size.  No unusual masses, nodularity, or tenderness.  EXTREMITIES: Unremarkable. NEUROLOGIC:  Unremarkable.  IMPRESSION:  Chronic right lower quadrant/pelvic pain.  PLAN:  Diagnostic laparoscopy.  Procedure and risks discussed as above.     Jill Cook M. Matthew Saras, M.D.     RMH/MEDQ  D:  10/28/2015  T:  10/28/2015  Job:  [id number]

## 2015-10-28 NOTE — H&P (Signed)
Jill Cook  DICTATION # [id number] CSN# [id number]   Margarette Asal, MD 10/28/2015 11:13 AM

## 2015-10-28 NOTE — Progress Notes (Signed)
To [facility] [place] on arrival-Npo after Mn-contacted Dr [facility] office regarding orders.

## 2015-10-28 NOTE — Anesthesia Preprocedure Evaluation (Addendum)
Anesthesia Evaluation  Patient identified by MRN, date of birth, ID band Patient awake    Reviewed: Allergy & Precautions, H&P , NPO status , Patient's Chart, lab work & pertinent test results  Airway Mallampati: III   Neck ROM: Full    Dental  (+) Teeth Intact, Dental Advisory Given   Pulmonary neg pulmonary ROS,    breath sounds clear to auscultation       Cardiovascular hypertension, Pt. on medications  Rhythm:Regular  ECHO 2009 EF 60% normal valves   Neuro/Psych Anxiety Depression negative neurological ROS  negative psych ROS   GI/Hepatic negative GI ROS, Neg liver ROS, GERD  Medicated,  Endo/Other  negative endocrine ROS  Renal/GU negative Renal ROS   Pelvic pain    Musculoskeletal negative musculoskeletal ROS (+)   Abdominal (+)  Abdomen: soft.    Peds  Hematology  (+) Blood dyscrasia, , 11/35, plts, 166, INR 1.12   Anesthesia Other Findings   Reproductive/Obstetrics negative OB ROS                           Anesthesia Physical Anesthesia Plan  ASA: II  Anesthesia Plan: General   Post-op Pain Management:    Induction: Intravenous  Airway Management Planned: Oral ETT  Additional Equipment:   Intra-op Plan:   Post-operative Plan: Extubation in OR  Informed Consent: I have reviewed the patients History and Physical, chart, labs and discussed the procedure including the risks, benefits and alternatives for the proposed anesthesia with the patient or authorized representative who has indicated his/her understanding and acceptance.     Plan Discussed with:   Anesthesia Plan Comments:         Anesthesia Quick Evaluation

## 2015-10-31 ENCOUNTER — Ambulatory Visit: Payer: 59 | Attending: Orthopedic Surgery

## 2015-10-31 DIAGNOSIS — M25561 Pain in right knee: Secondary | ICD-10-CM | POA: Diagnosis not present

## 2015-10-31 NOTE — Therapy (Signed)
[place] MAIN [facility] SERVICES [address] [place], [place], [address] Phone: [phone]   Fax:  [phone]  Physical Therapy Treatment  Patient Details  Name: Jill Cook MRN: [id number] Date of Birth: 1966/03/15 Referring Provider: Paralee Cancel  Encounter Date: 10/31/2015      PT End of Session - 10/31/15 1715    Visit Number 16   Number of Visits 21   Date for PT Re-Evaluation 11/22/15   PT Start Time 1300   PT Stop Time 1345   PT Time Calculation (min) 45 min   Activity Tolerance Patient tolerated treatment well   Behavior During Therapy [facility] for tasks assessed/performed      Past Medical History  Diagnosis Date  . Hypertension   . Diverticulosis   . History of esophagitis   . OA (osteoarthritis) of knee     RIGHT  . Arthritis     RIGHT HIP AND WRIST  . Anal fissure   . Anemia   . GERD (gastroesophageal reflux disease)     OTC PRN  . Anxiety     Past Surgical History  Procedure Laterality Date  . Cesarean section  1994  . Anal fissure repair  06-13-2000  . D & c hysteroscopy /  novasure endometrial ablation  11-15-2005  . Knee arthroscopy w/ meniscectomy Right   . Right knee patellofemoral arthroplasty  06-10-2008  . Bunionectomy Right 1991  . Septoplasty  2000  . Transthoracic echocardiogram  06-03-2008    NORMAL /  EF 60%  . Tubal ligation  1996  . Tonsillectomy  AGE 50  . Knee arthroscopy with medial menisectomy Right 10/06/2013    Procedure: RIGHT KNEE ARTHROSCOPY WITH CHONDROPLASTY;  Surgeon: Sydnee Cabal, MD;  Location: [facility];  Service: Orthopedics;  Laterality: Right;  . Conversion to total knee Right 08/22/2015    Procedure: partial knee CONVERSION TO RIGHT TOTAL KNEE;  Surgeon: Paralee Cancel, MD;  [facility];  Service: Orthopedics;  Laterality: Right;    There were no vitals filed for this visit.  Visit Diagnosis:  Right knee pain      Subjective Assessment -  10/31/15 1713    Subjective pt reports she had a really hard weekend. she did some more aggressive stretching. following she had more knee pain which she finds frustrating.    Limitations Walking;Standing;Sitting   How long can you sit comfortably? pressure behind leg 5 to 10 min   How long can you walk comfortably? 30 min    Patient Stated Goals go back to gym, get back to work   Currently in Pain? Yes   Pain Score 5    Pain Location --  R knee   Pain Onset 1 to 4 weeks ago     manual therapy   AP/ PA tib/femural mobs grade 4 5 bouts 15s each  Patellar mobs inferior/superior grade 3 3 bouts 10 s each PROM to R knee into full extension grade 2-3 6 bouts 20s  PROM following manual therapy: 0/120deg   therex: Side stepping, retro walking on TM 8% grade 1.33mh with and without squat Prone quad stretch with belt 30s x 3    pt requires CGA for safety                           PT Long Term Goals - 10/25/15 1258    PT LONG TERM GOAL #1   Title pt  will have increase R knee flexion AROM to 120 degrees for to descend stairs.   Baseline 3-118   Time 6   Period Weeks   Status Partially Met   PT LONG TERM GOAL #2   Title pt will have a decrease in knee pain <2/10 in order to sleep thorugh the night without waking   Time 6   Period Weeks   Status Partially Met   PT LONG TERM GOAL #3   Title pt will be independent in HEP to improve strength, mobility and decrease pain for better functional independence with ADLs   Time 6   Period Weeks   Status Achieved   PT LONG TERM GOAL #4   Title pt will improve LEFS socre by 9 pts demonstrating a decrease in disability   Baseline 23   Time 6   Period Weeks   Status Achieved   PT LONG TERM GOAL #5   Title pt will be able to stand for 1 hr without increased pain to be able to return to work    Time 4   Period Weeks   Status Partially Met               Plan - 10/31/15 1718    Clinical Impression Statement  pt responded well to manual therapy today with less pain and improved ROM.    Pt will benefit from skilled therapeutic intervention in order to improve on the following deficits Abnormal gait;Decreased endurance;Decreased mobility;Decreased range of motion;Decreased strength;Decreased activity tolerance;Increased edema   Rehab Potential Excellent   Clinical Impairments Affecting Rehab Potential prior knee surgery   PT Frequency 2x / week   PT Duration 4 weeks   PT Treatment/Interventions ADLs/Self Care Home Management;Cryotherapy;Electrical Stimulation;Neuromuscular re-education;Balance training;Therapeutic exercise;Therapeutic activities;Functional mobility training;Stair training;Gait training;Manual techniques;Passive range of motion;Dry needling;Taping   PT Home Exercise Plan Heel slide, SAQ, knee extension stretch, SLR   Consulted and Agree with Plan of Care Patient        Problem List Patient Active Problem List   Diagnosis Date Noted  . Obese 08/23/2015  . 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 Edema 08/17/2015  . Generalized anxiety disorder 08/17/2015  . Depression 08/17/2015  . RLQ abdominal pain 03/29/2015  . S/P right knee arthroscopy 10/06/2013  . SPLENOMEGALY 10/15/2008  . ESOPHAGITIS 10/14/2008  . NAUSEA 10/14/2008  . DYSPEPSIA 04/14/2008  . RECTAL FISSURE 04/14/2008  . Abdominal pain, left upper quadrant 04/14/2008  . GERD 04/12/2008  . DUODENITIS 04/12/2008  . IRRITABLE BOWEL SYNDROME 04/12/2008  . ANEMIA, IRON DEFICIENCY, HX OF 04/12/2008  . ESOPHAGITIS, HX OF 04/12/2008   Ashley C. Tortorici, PT, DPT #[id number]  Tortorici,Ashley 10/31/2015, 5:20 PM  [place] [facility] RE[facility] SERVICES [address] [place], [place], [address] Phone: [phone]   Fax:  [phone]  Name: Jill Cook MRN: [id number] Date of Birth: 05/04/1966     

## 2015-11-01 ENCOUNTER — Encounter (HOSPITAL_BASED_OUTPATIENT_CLINIC_OR_DEPARTMENT_OTHER): Payer: Self-pay | Admitting: *Deleted

## 2015-11-01 ENCOUNTER — Encounter (HOSPITAL_BASED_OUTPATIENT_CLINIC_OR_DEPARTMENT_OTHER)
Admission: RE | Disposition: A | Discharge status: Home / Self Care | Payer: Self-pay | Source: Ambulatory Visit | Attending: Obstetrics and Gynecology

## 2015-11-01 ENCOUNTER — Ambulatory Visit (HOSPITAL_BASED_OUTPATIENT_CLINIC_OR_DEPARTMENT_OTHER)
Admission: RE | Admit: 2015-11-01 | Discharge: 2015-11-01 | Disposition: A | Discharge status: Home / Self Care | Payer: 59 | Source: Ambulatory Visit | Attending: Obstetrics and Gynecology | Admitting: Obstetrics and Gynecology

## 2015-11-01 ENCOUNTER — Ambulatory Visit (HOSPITAL_BASED_OUTPATIENT_CLINIC_OR_DEPARTMENT_OTHER): Payer: 59 | Admitting: Anesthesiology

## 2015-11-01 DIAGNOSIS — R102 Pelvic and perineal pain: Secondary | ICD-10-CM | POA: Diagnosis not present

## 2015-11-01 DIAGNOSIS — I10 Essential (primary) hypertension: Secondary | ICD-10-CM | POA: Insufficient documentation

## 2015-11-01 DIAGNOSIS — N803 Endometriosis of pelvic peritoneum: Secondary | ICD-10-CM | POA: Diagnosis not present

## 2015-11-01 DIAGNOSIS — D252 Subserosal leiomyoma of uterus: Secondary | ICD-10-CM | POA: Diagnosis not present

## 2015-11-01 DIAGNOSIS — R1031 Right lower quadrant pain: Secondary | ICD-10-CM | POA: Diagnosis not present

## 2015-11-01 DIAGNOSIS — N736 Female pelvic peritoneal adhesions (postinfective): Secondary | ICD-10-CM | POA: Diagnosis not present

## 2015-11-01 HISTORY — PX: LAPAROSCOPY: SHX197

## 2015-11-01 LAB — TYPE AND SCREEN
ABO/RH(D): A POS
Antibody Screen: NEGATIVE

## 2015-11-01 LAB — POCT I-STAT, CHEM 8
BUN: 16 mg/dL (ref 6–20)
Calcium, Ion: 1.22 mmol/L (ref 1.12–1.23)
Chloride: 102 mmol/L (ref 101–111)
Creatinine, Ser: 1 mg/dL (ref 0.44–1.00)
Glucose, Bld: 98 mg/dL (ref 65–99)
HEMATOCRIT: 34 % — AB (ref 36.0–46.0)
HEMOGLOBIN: 11.6 g/dL — AB (ref 12.0–15.0)
Potassium: 4 mmol/L (ref 3.5–5.1)
SODIUM: 143 mmol/L (ref 135–145)
TCO2: 25 mmol/L (ref 0–100)

## 2015-11-01 SURGERY — LAPAROSCOPY, DIAGNOSTIC
Anesthesia: General | Site: Abdomen

## 2015-11-01 MED ORDER — SUCCINYLCHOLINE CHLORIDE 20 MG/ML IJ SOLN
INTRAMUSCULAR | Status: DC | PRN
  Administered 2015-11-01: 100 mg via INTRAVENOUS

## 2015-11-01 MED ORDER — BUPIVACAINE HCL (PF) 0.25 % IJ SOLN
INTRAMUSCULAR | Status: DC | PRN
  Administered 2015-11-01: 10 mL

## 2015-11-01 MED ORDER — FENTANYL CITRATE (PF) 100 MCG/2ML IJ SOLN
INTRAMUSCULAR | Status: AC
  Filled 2015-11-01: qty 2

## 2015-11-01 MED ORDER — ALBUTEROL SULFATE HFA 108 (90 BASE) MCG/ACT IN AERS
INHALATION_SPRAY | RESPIRATORY_TRACT | Status: DC | PRN
  Administered 2015-11-01 (×2): 6 via RESPIRATORY_TRACT

## 2015-11-01 MED ORDER — PROPOFOL 10 MG/ML IV BOLUS
INTRAVENOUS | Status: AC
  Filled 2015-11-01: qty 20

## 2015-11-01 MED ORDER — MIDAZOLAM HCL 5 MG/5ML IJ SOLN
INTRAMUSCULAR | Status: DC | PRN
  Administered 2015-11-01: 2 mg via INTRAVENOUS

## 2015-11-01 MED ORDER — FENTANYL CITRATE (PF) 100 MCG/2ML IJ SOLN
25.0000 ug | INTRAMUSCULAR | Status: DC | PRN
  Filled 2015-11-01: qty 1

## 2015-11-01 MED ORDER — ONDANSETRON HCL 4 MG/2ML IJ SOLN
INTRAMUSCULAR | Status: DC | PRN
  Administered 2015-11-01: 4 mg via INTRAVENOUS

## 2015-11-01 MED ORDER — DEXAMETHASONE SODIUM PHOSPHATE 4 MG/ML IJ SOLN
INTRAMUSCULAR | Status: DC | PRN
  Administered 2015-11-01: 10 mg via INTRAVENOUS

## 2015-11-01 MED ORDER — ALBUTEROL SULFATE HFA 108 (90 BASE) MCG/ACT IN AERS
INHALATION_SPRAY | RESPIRATORY_TRACT | Status: AC
  Filled 2015-11-01: qty 6.7

## 2015-11-01 MED ORDER — FENTANYL CITRATE (PF) 100 MCG/2ML IJ SOLN
INTRAMUSCULAR | Status: DC | PRN
  Administered 2015-11-01 (×2): 100 ug via INTRAVENOUS

## 2015-11-01 MED ORDER — KETOROLAC TROMETHAMINE 30 MG/ML IJ SOLN
INTRAMUSCULAR | Status: DC | PRN
  Administered 2015-11-01: 30 mg via INTRAVENOUS

## 2015-11-01 MED ORDER — MIDAZOLAM HCL 2 MG/2ML IJ SOLN
INTRAMUSCULAR | Status: AC
  Filled 2015-11-01: qty 2

## 2015-11-01 MED ORDER — PROMETHAZINE HCL 25 MG/ML IJ SOLN
6.2500 mg | INTRAMUSCULAR | Status: DC | PRN
  Filled 2015-11-01: qty 1

## 2015-11-01 MED ORDER — MEPERIDINE HCL 25 MG/ML IJ SOLN
6.2500 mg | INTRAMUSCULAR | Status: DC | PRN
  Filled 2015-11-01: qty 1

## 2015-11-01 MED ORDER — LACTATED RINGERS IV SOLN
INTRAVENOUS | Status: DC
  Administered 2015-11-01 (×3): via INTRAVENOUS
  Filled 2015-11-01: qty 1000

## 2015-11-01 MED ORDER — PROPOFOL 10 MG/ML IV BOLUS
INTRAVENOUS | Status: DC | PRN
  Administered 2015-11-01: 200 mg via INTRAVENOUS

## 2015-11-01 MED ORDER — LIDOCAINE HCL (CARDIAC) 20 MG/ML IV SOLN
INTRAVENOUS | Status: DC | PRN
  Administered 2015-11-01: 100 mg via INTRAVENOUS

## 2015-11-01 SURGICAL SUPPLY — 33 items
BANDAGE ADH SHEER 1  50/CT (GAUZE/BANDAGES/DRESSINGS) ×2 IMPLANT
BLADE SURG 11 STRL SS (BLADE) ×3 IMPLANT
CATH ROBINSON RED A/P 16FR (CATHETERS) ×2 IMPLANT
COVER MAYO STAND STRL (DRAPES) ×3 IMPLANT
DRAPE UNDERBUTTOCKS STRL (DRAPE) ×3 IMPLANT
ELECT REM PT RETURN 9FT ADLT (ELECTROSURGICAL) ×3
ELECTRODE REM PT RTRN 9FT ADLT (ELECTROSURGICAL) ×1 IMPLANT
GLOVE BIO SURGEON STRL SZ7 (GLOVE) ×6 IMPLANT
GOWN STRL REUS W/ TWL LRG LVL3 (GOWN DISPOSABLE) ×2 IMPLANT
GOWN STRL REUS W/TWL LRG LVL3 (GOWN DISPOSABLE) ×9
KIT ROOM TURNOVER WOR (KITS) ×3 IMPLANT
LEGGING LITHOTOMY PAIR STRL (DRAPES) ×3 IMPLANT
LIQUID BAND (GAUZE/BANDAGES/DRESSINGS) ×2 IMPLANT
NDL HYPO 25X1 1.5 SAFETY (NEEDLE) ×1 IMPLANT
NDL INSUFFLATION 14GA 120MM (NEEDLE) ×1 IMPLANT
NEEDLE HYPO 25X1 1.5 SAFETY (NEEDLE) ×3 IMPLANT
NEEDLE INSUFFLATION 14GA 120MM (NEEDLE) ×3 IMPLANT
NS IRRIG 500ML POUR BTL (IV SOLUTION) ×3 IMPLANT
PACK BASIN DAY SURGERY FS (CUSTOM PROCEDURE TRAY) ×3 IMPLANT
PACK LAPAROSCOPY II (CUSTOM PROCEDURE TRAY) ×3 IMPLANT
PAD OB MATERNITY 4.3X12.25 (PERSONAL CARE ITEMS) ×3 IMPLANT
PAD PREP 24X48 CUFFED NSTRL (MISCELLANEOUS) ×3 IMPLANT
PADDING ION DISPOSABLE (MISCELLANEOUS) ×3 IMPLANT
SOLUTION ANTI FOG 6CC (MISCELLANEOUS) ×3 IMPLANT
SUT MNCRL AB 4-0 PS2 18 (SUTURE) ×3 IMPLANT
SYR CONTROL 10ML LL (SYRINGE) ×3 IMPLANT
SYRINGE 10CC LL (SYRINGE) ×3 IMPLANT
TOWEL OR 17X24 6PK STRL BLUE (TOWEL DISPOSABLE) ×6 IMPLANT
TRAY DSU PREP LF (CUSTOM PROCEDURE TRAY) ×3 IMPLANT
TROCAR OPTI TIP 5M 100M (ENDOMECHANICALS) ×3 IMPLANT
TROCAR XCEL DIL TIP R 11M (ENDOMECHANICALS) ×3 IMPLANT
TUBING INSUFFLATION 10FT LAP (TUBING) ×3 IMPLANT
WARMER LAPAROSCOPE (MISCELLANEOUS) ×3 IMPLANT

## 2015-11-01 NOTE — Progress Notes (Signed)
The patient was re-examined with no change in status 

## 2015-11-01 NOTE — Transfer of Care (Signed)
Immediate Anesthesia Transfer of Care Note  Patient: Jill Cook  Procedure(s) Performed: Procedure(s) (LRB): LAPAROSCOPY DIAGNOSTIC (N/A)  Patient Location: PACU  Anesthesia Type: General  Level of Consciousness: awake, sedated, patient cooperative and responds to stimulation  Airway & Oxygen Therapy: Patient Spontanous Breathing and Patient connected to face mask oxygen  Post-op Assessment: Report given to PACU RN, Post -op Vital signs reviewed and stable and Patient moving all extremities  Post vital signs: Reviewed and stable  Complications: No apparent anesthesia complications

## 2015-11-01 NOTE — Discharge Instructions (Signed)
Laparoscopy Care After Refer to this sheet in the next few weeks. These instructions provide you with information about caring for yourself after your procedure. Your health care provider may also give you more specific instructions. Your treatment has been planned according to current medical practices, but problems sometimes occur. Call your health care provider if you have any problems or questions after your procedure. WHAT TO EXPECT AFTER THE PROCEDURE After your procedure, it is common to have:  Sore throat.  Soreness at the incision site.  Mild cramping.  Tiredness.  Mild nausea or vomiting.  Shoulder pain. HOME CARE INSTRUCTIONS  Rest for the remainder of the day.  Take medicines only as directed by your health care provider. These include over-the-counter medicines and prescription medicines. Do not take aspirin, which can cause bleeding.  Over the next few days, gradually return to your normal activities and your normal diet.  Avoid sexual intercourse for 2 weeks or as directed by your health care provider.  Do not use tampons, and do not douche.  Do not drive or operate heavy machinery while taking pain medicine.  Do not lift anything that is heavier than 5 lb (2.3 kg) for 2 weeks or as directed by your health care provider.  Do not take baths. Take showers only. Ask your health care provider when you can start taking baths.  There are many different ways to close and cover an incision, including stitches (sutures), skin glue, and adhesive strips. Follow instructions from your health care provider about:  Incision care.  Bandage (dressing) changes and removal.  Incision closure removal.  Check your incision area every day for signs of infection. Watch for:  Redness, swelling, or pain.  Fluid, blood, or pus.  Keep all follow-up visits as directed by your health care provider. SEEK MEDICAL CARE IF:  You have redness, swelling, or increasing pain in your  incision area.  You have fluid, blood, or pus coming from your incision for longer than 1 day.  You notice a bad smell coming from your incision or your dressing.  The edges of your incision break open after the sutures have been removed.  Your pain does not decrease after 2-3 days.  You have a rash.  You repeatedly become dizzy or light-headed.  You have a reaction to your medicine.  Your pain medicine is not helping.  You are constipated. SEEK IMMEDIATE MEDICAL CARE IF:  You have a fever.  You faint.  You have increasing pain in your abdomen.  You have severe pain in one or both of your shoulders.  You have bleeding or drainage from your suture sites or your vagina after surgery.  You have shortness of breath or have difficulty breathing.  You have chest pain or leg pain.  You have ongoing nausea, vomiting, or diarrhea.   This information is not intended to replace advice given to you by your health care provider. Make sure you discuss any questions you have with your health care provider.   Document Released: 02/02/2005 Document Revised: 11/30/2014 Document Reviewed: 10/27/2011 Elsevier Interactive Patient Education 2016 [place] Anest[facility] Home Care Instructions  Activity: Get plenty of rest for the remainder of the day. A responsible adult should stay with you for 24 hours following the procedure.  For the next 24 hours, DO NOT: -Drive a car -Paediatric nurse -Drink alcoholic beverages -Take any medication unless instructed by your physician -Make any legal decisions or sign important papers.  Meals: Start with liquid  foods such as gelatin or soup. Progress to regular foods as tolerated. Avoid greasy, spicy, heavy foods. If nausea and/or vomiting occur, drink only clear liquids until the nausea and/or vomiting subsides. Call your physician if vomiting continues.  Special Instructions/Symptoms: Your throat may feel dry or sore from the  anesthesia or the breathing tube placed in your throat during surgery. If this causes discomfort, gargle with warm salt water. The discomfort should disappear within 24 hours.  If you had a scopolamine patch placed behind your ear for the management of post- operative nausea and/or vomiting:  1. The medication in the patch is effective for 72 hours, after which it should be removed.  Wrap patch in a tissue and discard in the trash. Wash hands thoroughly with soap and water. 2. You may remove the patch earlier than 72 hours if you experience unpleasant side effects which may include dry mouth, dizziness or visual disturbances. 3. Avoid touching the patch. Wash your hands with soap and water after contact with the patch.

## 2015-11-01 NOTE — Anesthesia Procedure Notes (Signed)
Procedure Name: Intubation Date/Time: 11/01/2015 7:33 AM Performed by: Justice Rocher Pre-anesthesia Checklist: Patient identified, Emergency Drugs available, Suction available and Patient being monitored Patient Re-evaluated:Patient Re-evaluated prior to inductionOxygen Delivery Method: Circle System Utilized Preoxygenation: Pre-oxygenation with 100% oxygen Intubation Type: IV induction Ventilation: Mask ventilation without difficulty Laryngoscope Size: Mac and 3 Grade View: Grade II Tube type: Oral Tube size: 7.0 mm Number of attempts: 1 Airway Equipment and Method: Stylet and Oral airway Placement Confirmation: ETT inserted through vocal cords under direct vision,  positive ETCO2 and breath sounds checked- equal and bilateral Secured at: 22 cm Tube secured with: Tape Dental Injury: Teeth and Oropharynx as per pre-operative assessment

## 2015-11-01 NOTE — Anesthesia Postprocedure Evaluation (Signed)
Anesthesia Post Note  Patient: Jill Cook  Procedure(s) Performed: Procedure(s) (LRB): LAPAROSCOPY DIAGNOSTIC (N/A)  Patient location during evaluation: PACU Anesthesia Type: General Level of consciousness: awake and alert Pain management: pain level controlled Vital Signs Assessment: post-procedure vital signs reviewed and stable Respiratory status: spontaneous breathing, nonlabored ventilation, respiratory function stable and patient connected to nasal cannula oxygen Cardiovascular status: blood pressure returned to baseline and stable Postop Assessment: no signs of nausea or vomiting Anesthetic complications: no    Last Vitals:  Filed Vitals:   11/01/15 0822 11/01/15 0830  BP: 144/70 122/71  Pulse: 115 97  Temp: 36.6 C   Resp: 21 22    Last Pain:  Filed Vitals:   11/01/15 0832  PainSc: Asleep                 Alexis Frock

## 2015-11-01 NOTE — Op Note (Signed)
Preoperative diagnosis: Chronic mid and right lower quadrant pelvic pain  Postoperative diagnosis: Pelvic endometriosis, pelvic adhesions  Procedure: Diagnostic laparoscopy  Surgeon: Matthew Saras  Anesthesia: Gen.  EBL: Less than 10 cc  Procedure and findings:  Patient taken the operating room after an adequate level of general anesthesia was obtained with the patient's legs in stirrups the abdomen perineum and vagina were prepped and draped and the bladder was drained. EUA was carried out the uterus is midposition, mobile normal size adnexa negative, Conn cannula was positioned. This was done after appropriate timeouts were taken.  Attention directed to the abdomen where the subumbilical area was infiltrated with quarter percent Marcaine plain small incision was made in the varies needle was introduced without difficulty. Its intra-abdominal position was verified by pressure water testing. After 2-1/2 L pneumoperitoneum syncopated, lap scopic trocar and sleeve were then introduced that difficulty. There was no evidence of any bleeding or trauma. 3 finger breaths above the symphysis in the midline a 5 mm trocar was inserted under direct visualization. The patient was then placed in Trendelenburg and the pelvic findings as follows:  The upper abdomen liver edge and appendix were all normal. Right tube and ovary as was normal there was small subserosal fibroid at the fundus of the uterus was otherwise unremarkable as was the anterior bladder flap  Right broad ligament was unremarkable with she had definite right uterosacral and lateral to right uterosacral ligament pelvic endometriosis noted. Of note that most of her pain is been on the right side. Left side appeared to be unremarkable there was a solitary epiploic: Adhesion to the left ovary which is otherwise normal this was not lysed as think she will for definitive hysterectomy for treatment. These areas were photo documented instruments removed gas  allowed to escape upper defect closed with 4-0 Monocryl subcuticular and Dermabond on the lower she tolerated this well went to recovery room in good condition.  Dictated with [place].

## 2015-11-02 ENCOUNTER — Ambulatory Visit: Payer: 59

## 2015-11-02 ENCOUNTER — Encounter (HOSPITAL_BASED_OUTPATIENT_CLINIC_OR_DEPARTMENT_OTHER): Payer: Self-pay | Admitting: Obstetrics and Gynecology

## 2015-11-08 ENCOUNTER — Ambulatory Visit: Payer: 59

## 2015-11-08 DIAGNOSIS — M25561 Pain in right knee: Secondary | ICD-10-CM | POA: Diagnosis not present

## 2015-11-08 NOTE — Therapy (Signed)
[place] MAIN [facility] SERVICES [address] [place], [place], [address] Phone: [phone]   Fax:  [phone]  Physical Therapy Treatment  Patient Details  Name: Jill Cook MRN: [id number] Date of Birth: 1966-01-18 Referring Provider: Paralee Cancel  Encounter Date: 11/08/2015      PT End of Session - 11/08/15 1328    Visit Number 17   Number of Visits 29   Date for PT Re-Evaluation 12/06/15   PT Start Time 1100   PT Stop Time 1145   PT Time Calculation (min) 45 min   Activity Tolerance Patient tolerated treatment well   Behavior During Therapy [facility] for tasks assessed/performed      Past Medical History  Diagnosis Date  . Hypertension   . Diverticulosis   . History of esophagitis   . OA (osteoarthritis) of knee     RIGHT  . Arthritis     RIGHT HIP AND WRIST  . Anal fissure   . Anemia   . GERD (gastroesophageal reflux disease)     OTC PRN  . Anxiety     Past Surgical History  Procedure Laterality Date  . Cesarean section  1994  . Anal fissure repair  06-13-2000  . D & c hysteroscopy /  novasure endometrial ablation  11-15-2005  . Knee arthroscopy w/ meniscectomy Right   . Right knee patellofemoral arthroplasty  06-10-2008  . Bunionectomy Right 1991  . Septoplasty  2000  . Transthoracic echocardiogram  06-03-2008    NORMAL /  EF 60%  . Tubal ligation  1996  . Tonsillectomy  AGE 50  . Knee arthroscopy with medial menisectomy Right 10/06/2013    Procedure: RIGHT KNEE ARTHROSCOPY WITH CHONDROPLASTY;  Surgeon: Sydnee Cabal, MD;  Location: [facility];  Service: Orthopedics;  Laterality: Right;  . Conversion to total knee Right 08/22/2015    Procedure: partial knee CONVERSION TO RIGHT TOTAL KNEE;  Surgeon: Paralee Cancel, MD;  Location: [facility];  Service: Orthopedics;  Laterality: Right;  . Laparoscopy N/A 11/01/2015    Procedure: LAPAROSCOPY DIAGNOSTIC;  Surgeon: Molli Posey, MD;  Location: [facility];  Service: Gynecology;  Laterality: N/A;    There were no vitals filed for this visit.      Subjective Assessment - 11/08/15 1326    Subjective pt reports her surgery went ok. she is sore all over however. pt reports her knee is feeling good. her follow up with MD which went well.    Limitations Walking;Standing;Sitting   How long can you sit comfortably? pressure behind leg 5 to 10 min   How long can you walk comfortably? 30 min    Patient Stated Goals go back to gym, get back to work   Currently in Pain? Yes   Pain Score 2    Pain Location --  R knee   Pain Onset 1 to 4 weeks ago     manual therapy   AP/ PA tib/femural mobs grade 4 5 bouts 15s each  Patellar mobs inferior/superior grade 3 3 bouts 10 s each PROM to R knee into full extension grade 2-3 6 bouts 20s  STM to R calf and hamstring x 8 min PROM into R knee flexion 15s bouts of over pressure 15s bouts of 4  Therex:  Weighted squats 15lbs 2x10 SL eccentric step down 6in 2x10 Clock reach 2x5 RLE only 3 step SLS hold on AIREX x 2 min BLE Fwd/side BOSU lunge x 12 each Pt  requires min verbal and tactile cues for proper exercise performance                                  PT Long Term Goals - 11/08/15 1330    PT LONG TERM GOAL #1   Title pt will have increase R knee flexion AROM to 120 degrees for to descend stairs.   Baseline 3-118   Time 6   Period Weeks   Status Achieved   PT LONG TERM GOAL #2   Title pt will have a decrease in knee pain <2/10 in order to sleep thorugh the night without waking   Time 6   Period Weeks   Status Partially Met   PT LONG TERM GOAL #3   Title pt will be independent in HEP to improve strength, mobility and decrease pain for better functional independence with ADLs   Time 6   Period Weeks   Status Achieved   PT LONG TERM GOAL #4   Title pt will improve LEFS socre by 9 pts demonstrating a decrease in disability   Baseline 23   Time 6    Period Weeks   Status Achieved   PT LONG TERM GOAL #5   Title pt will be able to stand for 1 hr without increased pain to be able to return to work    Time 4   Period Weeks   Status Partially Met               Plan - 11/08/15 1329    Clinical Impression Statement pt continues to progress well with therapy. her pain and ROM are progressing nicely. her balance and strength are improving well. pt would benefit from continued skilled PT services to furhter address pain, strength, ROM, balance to maximize function.    Rehab Potential Excellent   Clinical Impairments Affecting Rehab Potential prior knee surgery   PT Frequency 2x / week   PT Duration 4 weeks   PT Treatment/Interventions ADLs/Self Care Home Management;Cryotherapy;Electrical Stimulation;Neuromuscular re-education;Balance training;Therapeutic exercise;Therapeutic activities;Functional mobility training;Stair training;Gait training;Manual techniques;Passive range of motion;Dry needling;Taping   PT Home Exercise Plan Heel slide, SAQ, knee extension stretch, SLR   Consulted and Agree with Plan of Care Patient      Patient will benefit from skilled therapeutic intervention in order to improve the following deficits and impairments:  Abnormal gait, Decreased endurance, Decreased mobility, Decreased range of motion, Decreased strength, Decreased activity tolerance, Increased edema  Visit Diagnosis: Pain in right knee - Plan: PT plan of care cert/re-cert     Problem List Patient Active Problem List   Diagnosis Date Noted  . Obese 08/23/2015  . 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 Edema 08/17/2015  . Generalized anxiety disorder 08/17/2015  . Depression 08/17/2015  . RLQ abdominal pain 03/29/2015  . S/P right knee arthroscopy 10/06/2013  . SPLENOMEGALY 10/15/2008  . ESOPHAGITIS 10/14/2008  . NAUSEA 10/14/2008  . DYSPEPSIA 04/14/2008  . RECTAL FISSURE 04/14/2008  . Abdominal pain, left upper quadrant  04/14/2008  . GERD 04/12/2008  . DUODENITIS 04/12/2008  . IRRITABLE BOWEL SYNDROME 04/12/2008  . ANEMIA, IRON DEFICIENCY, HX OF 04/12/2008  . ESOPHAGITIS, HX OF 04/12/2008  Gorden Harms. Tijah Hane, PT, DPT [phone]   Karrigan Messamore 11/08/2015, 1:32 PM  [place] MAIN [facility] SERVICES [address] [place], [place], [address] Phone: [phone]   Fax:  [phone]  Name:  Jill Cook MRN: [id number] Date of Birth: [birth year]

## 2015-11-10 ENCOUNTER — Ambulatory Visit: Payer: 59

## 2015-11-10 DIAGNOSIS — M25561 Pain in right knee: Secondary | ICD-10-CM | POA: Diagnosis not present

## 2015-11-10 NOTE — Therapy (Signed)
[place] MAIN [facility] SERVICES [address] [place], [place], [address] Phone: [phone]   Fax:  [phone]  Physical Therapy Treatment  Patient Details  Name: Jill Cook MRN: [id number] Date of Birth: [birth year] Referring Provider: Paralee Cancel  Encounter Date: 11/10/2015      PT End of Session - 11/10/15 1250    Visit Number 18   Number of Visits 29   Date for PT Re-Evaluation 12/06/15   PT Start Time 1110   PT Stop Time 1155   PT Time Calculation (min) 45 min   Activity Tolerance Patient tolerated treatment well   Behavior During Therapy [facility] for tasks assessed/performed      Past Medical History  Diagnosis Date  . Hypertension   . Diverticulosis   . History of esophagitis   . OA (osteoarthritis) of knee     RIGHT  . Arthritis     RIGHT HIP AND WRIST  . Anal fissure   . Anemia   . GERD (gastroesophageal reflux disease)     OTC PRN  . Anxiety     Past Surgical History  Procedure Laterality Date  . Cesarean section  1994  . Anal fissure repair  06-13-2000  . D & c hysteroscopy /  novasure endometrial ablation  11-15-2005  . Knee arthroscopy w/ meniscectomy Right   . Right knee patellofemoral arthroplasty  06-10-2008  . Bunionectomy Right 1991  . Septoplasty  2000  . Transthoracic echocardiogram  06-03-2008    NORMAL /  EF 60%  . Tubal ligation  1996  . Tonsillectomy  AGE 50  . Knee arthroscopy with medial menisectomy Right 10/06/2013    Procedure: RIGHT KNEE ARTHROSCOPY WITH CHONDROPLASTY;  Surgeon: Sydnee Cabal, MD;  Location: [facility];  Service: Orthopedics;  Laterality: Right;  . Conversion to total knee Right 08/22/2015    Procedure: partial knee CONVERSION TO RIGHT TOTAL KNEE;  Surgeon: Paralee Cancel, MD;  Location: [facility];  Service: Orthopedics;  Laterality: Right;  . Laparoscopy N/A 11/01/2015    Procedure: LAPAROSCOPY DIAGNOSTIC;  Surgeon: Molli Posey, MD;  Location: [facility];  Service: Gynecology;  Laterality: N/A;    There were no vitals filed for this visit.      Subjective Assessment - 11/10/15 1249    Subjective " my knee feels stiff today" pt drove 4 hours yesterday   Limitations Walking;Standing;Sitting   How long can you sit comfortably? pressure behind leg 5 to 10 min   How long can you walk comfortably? 30 min    Patient Stated Goals go back to gym, get back to work   Currently in Pain? Yes   Pain Score 3    Pain Location Knee   Pain Orientation Right   Pain Onset 1 to 4 weeks ago     manual therapy    Scar mobs x 2 min Patellar mobilizations inferior/superior grade 3 2 x 20s  R knee mobilizations AP and PA grades III and IV for 3x30 Proximal tib/fib mobs grade III 2 bouts 15s Passive R knee ROM into end range flexion /extension with small oscillations at end range x 20s X 10 reps each.  Pt had improved joint mobility following mobilzations R knee PROM 3 to 117 degrees following manual therapy     therex  Rec bike x 3 min no charge Weighted squat 15lbs 3x10 SL lunge with dowel for balance 2x10 Side BOSU lunge 2x10 SLS on BOSU flat up  with clock reach 1 min x 3 Standing ball toss on BOSU flat down 1 min x 3 Pt requires min verbal and tactile cues for proper exercise performance                           PT Long Term Goals - 11/08/15 1330    PT LONG TERM GOAL #1   Title pt will have increase R knee flexion AROM to 120 degrees for to descend stairs.   Baseline 3-118   Time 6   Period Weeks   Status Achieved   PT LONG TERM GOAL #2   Title pt will have a decrease in knee pain <2/10 in order to sleep thorugh the night without waking   Time 6   Period Weeks   Status Partially Met   PT LONG TERM GOAL #3   Title pt will be independent in HEP to improve strength, mobility and decrease pain for better functional independence with ADLs   Time 6   Period Weeks   Status Achieved   PT LONG TERM  GOAL #4   Title pt will improve LEFS socre by 9 pts demonstrating a decrease in disability   Baseline 23   Time 6   Period Weeks   Status Achieved   PT LONG TERM GOAL #5   Title pt will be able to stand for 1 hr without increased pain to be able to return to work    Time 4   Period Weeks   Status Partially Met               Plan - 11/10/15 1250    Clinical Impression Statement pt demonstrates improved balance/ knee stability and strength with progression exercises today. pt had a slight loss in ROM today.    Rehab Potential Excellent   Clinical Impairments Affecting Rehab Potential prior knee surgery   PT Frequency 2x / week   PT Duration 4 weeks   PT Treatment/Interventions ADLs/Self Care Home Management;Cryotherapy;Electrical Stimulation;Neuromuscular re-education;Balance training;Therapeutic exercise;Therapeutic activities;Functional mobility training;Stair training;Gait training;Manual techniques;Passive range of motion;Dry needling;Taping   PT Home Exercise Plan Heel slide, SAQ, knee extension stretch, SLR   Consulted and Agree with Plan of Care Patient      Patient will benefit from skilled therapeutic intervention in order to improve the following deficits and impairments:  Abnormal gait, Decreased endurance, Decreased mobility, Decreased range of motion, Decreased strength, Decreased activity tolerance, Increased edema  Visit Diagnosis: Pain in right knee     Problem List Patient Active Problem List   Diagnosis Date Noted  . Obese 08/23/2015  . 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 Edema 08/17/2015  . Generalized anxiety disorder 08/17/2015  . Depression 08/17/2015  . RLQ abdominal pain 03/29/2015  . S/P right knee arthroscopy 10/06/2013  . SPLENOMEGALY 10/15/2008  . ESOPHAGITIS 10/14/2008  . NAUSEA 10/14/2008  . DYSPEPSIA 04/14/2008  . RECTAL FISSURE 04/14/2008  . Abdominal pain, left upper quadrant 04/14/2008  . GERD 04/12/2008  .  DUODENITIS 04/12/2008  . IRRITABLE BOWEL SYNDROME 04/12/2008  . ANEMIA, IRON DEFICIENCY, HX OF 04/12/2008  . ESOPHAGITIS, HX OF 04/12/2008    Jill Cook 11/10/2015, 12:51 PM  [place] MAIN [facility] SERVICES [address] [place], [place], [address] Phone: [phone]   Fax:  [phone]  Name: CAMBRIE SONNENFELD MRN: [id number] Date of Birth: [birth year]

## 2015-11-14 ENCOUNTER — Ambulatory Visit: Payer: 59

## 2015-11-14 DIAGNOSIS — M25561 Pain in right knee: Secondary | ICD-10-CM

## 2015-11-14 NOTE — Therapy (Signed)
[place] MAIN [facility] SERVICES [address] [place], [place], [address] Phone: [phone]   Fax:  [phone]  Physical Therapy Treatment  Patient Details  Name: Jill Cook MRN: [id number] Date of Birth: 08-17-1965 Referring Provider: Paralee Cancel  Encounter Date: 11/14/2015      PT End of Session - 11/14/15 1202    Visit Number 19   Number of Visits 29   Date for PT Re-Evaluation 12/06/15   PT Start Time 1115   PT Stop Time 1205   PT Time Calculation (min) 50 min   Activity Tolerance Patient tolerated treatment well   Behavior During Therapy [facility] for tasks assessed/performed      Past Medical History  Diagnosis Date  . Hypertension   . Diverticulosis   . History of esophagitis   . OA (osteoarthritis) of knee     RIGHT  . Arthritis     RIGHT HIP AND WRIST  . Anal fissure   . Anemia   . GERD (gastroesophageal reflux disease)     OTC PRN  . Anxiety     Past Surgical History  Procedure Laterality Date  . Cesarean section  1994  . Anal fissure repair  06-13-2000  . D & c hysteroscopy /  novasure endometrial ablation  11-15-2005  . Knee arthroscopy w/ meniscectomy Right   . Right knee patellofemoral arthroplasty  06-10-2008  . Bunionectomy Right 1991  . Septoplasty  2000  . Transthoracic echocardiogram  06-03-2008    NORMAL /  EF 60%  . Tubal ligation  1996  . Tonsillectomy  AGE 62  . Knee arthroscopy with medial menisectomy Right 10/06/2013    Procedure: RIGHT KNEE ARTHROSCOPY WITH CHONDROPLASTY;  Surgeon: Sydnee Cabal, MD;  Location: [facility];  Service: Orthopedics;  Laterality: Right;  . Conversion to total knee Right 08/22/2015    Procedure: partial knee CONVERSION TO RIGHT TOTAL KNEE;  Surgeon: Paralee Cancel, MD;  Location: [facility];  Service: Orthopedics;  Laterality: Right;  . Laparoscopy N/A 11/01/2015    Procedure: LAPAROSCOPY DIAGNOSTIC;  Surgeon: Molli Posey, MD;  Location: [facility];  Service: Gynecology;  Laterality: N/A;    There were no vitals filed for this visit.      Subjective Assessment - 11/14/15 1202    Subjective "I had a good knee weekend"   Limitations Walking;Standing;Sitting   How long can you sit comfortably? pressure behind leg 5 to 10 min   How long can you walk comfortably? 30 min    Patient Stated Goals go back to gym, get back to work   Currently in Pain? No/denies   Pain Onset 1 to 4 weeks ago         therex: Rec bike x 2 min L 2 no charge Leg press in fitness center: BLE 7 plates 2x10 Single leg R: 4 plates 2x10 Jumping on leg press 3 plates with soft landing 2x10 Butt burners x 3 laps with purple band  SL squat with TRX cords 3x5 Wall sit 20s x 3 Standing resisted hip flexion, abduction ,adduction and extension with  Cable column 7.5lbs x 10 each way no UE support  RLE only Pt requires min verbal and tactile cues for proper exercise performance      manual therapy  R knee mobilizations AP and PA grades III and IV for 3x30 Proximal tib/fib mobs grade III 2 bouts 15s Passive R knee ROM into end range flexion /extension with small  oscillations at end range x 20s X 10 reps each.  1-120 deg following                    PT Education - 11/14/15 1202    Education provided Yes   Education Details gym equipment forLE strengthening    Person(s) Educated Patient   Methods Explanation   Comprehension Verbalized understanding             PT Long Term Goals - 11/08/15 1330    PT LONG TERM GOAL #1   Title pt will have increase R knee flexion AROM to 120 degrees for to descend stairs.   Baseline 3-118   Time 6   Period Weeks   Status Achieved   PT LONG TERM GOAL #2   Title pt will have a decrease in knee pain <2/10 in order to sleep thorugh the night without waking   Time 6   Period Weeks   Status Partially Met   PT LONG TERM GOAL #3   Title pt will be independent in HEP to improve  strength, mobility and decrease pain for better functional independence with ADLs   Time 6   Period Weeks   Status Achieved   PT LONG TERM GOAL #4   Title pt will improve LEFS socre by 9 pts demonstrating a decrease in disability   Baseline 23   Time 6   Period Weeks   Status Achieved   PT LONG TERM GOAL #5   Title pt will be able to stand for 1 hr without increased pain to be able to return to work    Time 4   Period Weeks   Status Partially Met               Plan - 11/14/15 1203    Clinical Impression Statement pt did well with progression of LE strengthening in the fitness center. ROM is stable at 1-120 deg.    Rehab Potential Excellent   Clinical Impairments Affecting Rehab Potential prior knee surgery   PT Frequency 2x / week   PT Duration 4 weeks   PT Treatment/Interventions ADLs/Self Care Home Management;Cryotherapy;Electrical Stimulation;Neuromuscular re-education;Balance training;Therapeutic exercise;Therapeutic activities;Functional mobility training;Stair training;Gait training;Manual techniques;Passive range of motion;Dry needling;Taping   PT Home Exercise Plan Heel slide, SAQ, knee extension stretch, SLR   Consulted and Agree with Plan of Care Patient      Patient will benefit from skilled therapeutic intervention in order to improve the following deficits and impairments:  Abnormal gait, Decreased endurance, Decreased mobility, Decreased range of motion, Decreased strength, Decreased activity tolerance, Increased edema  Visit Diagnosis: Pain in right knee  Right knee pain     Problem List Patient Active Problem List   Diagnosis Date Noted  . Obese 08/23/2015  . 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 Edema 08/17/2015  . Generalized anxiety disorder 08/17/2015  . Depression 08/17/2015  . RLQ abdominal pain 03/29/2015  . S/P right knee arthroscopy 10/06/2013  . SPLENOMEGALY 10/15/2008  . ESOPHAGITIS 10/14/2008  . NAUSEA 10/14/2008  .  DYSPEPSIA 04/14/2008  . RECTAL FISSURE 04/14/2008  . Abdominal pain, left upper quadrant 04/14/2008  . GERD 04/12/2008  . DUODENITIS 04/12/2008  . IRRITABLE BOWEL SYNDROME 04/12/2008  . ANEMIA, IRON DEFICIENCY, HX OF 04/12/2008  . ESOPHAGITIS, HX OF 04/12/2008   Gorden Harms. Portia Wisdom, PT, DPT [phone]  Cristen Bredeson 11/14/2015, 12:04 PM  [place] MAIN [facility] SERVICES [place]  [place], [place], [address] Phone: [phone]   Fax:  [phone]  Name: Jill Cook MRN: [id number] Date of Birth: [birth year]

## 2015-11-16 DIAGNOSIS — N951 Menopausal and female climacteric states: Secondary | ICD-10-CM | POA: Diagnosis not present

## 2015-11-17 ENCOUNTER — Ambulatory Visit: Payer: 59

## 2015-11-17 DIAGNOSIS — M25561 Pain in right knee: Secondary | ICD-10-CM | POA: Diagnosis not present

## 2015-11-17 NOTE — Therapy (Signed)
[place] MAIN [facility] SERVICES [address] [place], [place], [address] Phone: [phone]   Fax:  [phone]  Physical Therapy Treatment  Patient Details  Name: Jill Cook MRN: [id number] Date of Birth: 10-27-1965 Referring Provider: Paralee Cancel  Encounter Date: 11/17/2015      PT End of Session - 11/17/15 1228    Visit Number 20   Number of Visits 29   Date for PT Re-Evaluation 12/06/15   PT Start Time 1115   PT Stop Time 1200   PT Time Calculation (min) 45 min   Activity Tolerance Patient tolerated treatment well   Behavior During Therapy [facility] for tasks assessed/performed      Past Medical History  Diagnosis Date  . Hypertension   . Diverticulosis   . History of esophagitis   . OA (osteoarthritis) of knee     RIGHT  . Arthritis     RIGHT HIP AND WRIST  . Anal fissure   . Anemia   . GERD (gastroesophageal reflux disease)     OTC PRN  . Anxiety     Past Surgical History  Procedure Laterality Date  . Cesarean section  1994  . Anal fissure repair  06-13-2000  . D & c hysteroscopy /  novasure endometrial ablation  11-15-2005  . Knee arthroscopy w/ meniscectomy Right   . Right knee patellofemoral arthroplasty  06-10-2008  . Bunionectomy Right 1991  . Septoplasty  2000  . Transthoracic echocardiogram  06-03-2008    NORMAL /  EF 60%  . Tubal ligation  1996  . Tonsillectomy  AGE 29  . Knee arthroscopy with medial menisectomy Right 10/06/2013    Procedure: RIGHT KNEE ARTHROSCOPY WITH CHONDROPLASTY;  Surgeon: Sydnee Cabal, MD;  Location: [facility];  Service: Orthopedics;  Laterality: Right;  . Conversion to total knee Right 08/22/2015    Procedure: partial knee CONVERSION TO RIGHT TOTAL KNEE;  Surgeon: Paralee Cancel, MD;  Location[facility];  Service: Orthopedics;  Laterality: Right;  . Laparoscopy N/A 11/01/2015    Procedure: LAPAROSCOPY DIAGNOSTIC;  Surgeon: Molli Posey, MD;  Location: [facility];  Service: Gynecology;  Laterality: N/A;    There were no vitals filed for this visit.      Subjective Assessment - 11/17/15 1225    Subjective "I was sore after last visit, but i feel good today"   Limitations Walking;Standing;Sitting   How long can you sit comfortably? pressure behind leg 5 to 10 min   How long can you walk comfortably? 30 min    Patient Stated Goals go back to gym, get back to work   Currently in Pain? No/denies   Pain Onset 1 to 4 weeks ago      Therex: Octane cross trainer L5 x 5 min Leg press 8 plates 2x15 SL squat (R) with TRX cord 3x5 SLS with 6lb ball slam 2x10 Squat with med ball toss 6lbs 2x10 SLS on BOSU 30s x 3 (RLE) Squat hold x 3s on BOSU x 10 Standing resisted hip flexion, abduction ,adduction and extension with        5lbs on cable column x 15 each way  Pt requires min verbal and tactile cues for proper exercise performance    Manual therapy    R knee mobilizations AP and PA grades III and IV for 3x30 Proximal tib/fib mobs grade III 2 bouts 30s Passive R knee ROM into end range flexion /extension with small oscillations at end  range x 20s X 10 reps each.  0-120 deg following                          PT Education - 11/17/15 1227    Education provided Yes   Education Details ROM progression   Person(s) Educated Patient   Methods Explanation   Comprehension Verbalized understanding             PT Long Term Goals - 11/08/15 1330    PT LONG TERM GOAL #1   Title pt will have increase R knee flexion AROM to 120 degrees for to descend stairs.   Baseline 3-118   Time 6   Period Weeks   Status Achieved   PT LONG TERM GOAL #2   Title pt will have a decrease in knee pain <2/10 in order to sleep thorugh the night without waking   Time 6   Period Weeks   Status Partially Met   PT LONG TERM GOAL #3   Title pt will be independent in HEP to improve strength, mobility and decrease pain for better  functional independence with ADLs   Time 6   Period Weeks   Status Achieved   PT LONG TERM GOAL #4   Title pt will improve LEFS socre by 9 pts demonstrating a decrease in disability   Baseline 23   Time 6   Period Weeks   Status Achieved   PT LONG TERM GOAL #5   Title pt will be able to stand for 1 hr without increased pain to be able to return to work    Time 4   Period Weeks   Status Partially Met               Plan - 11/17/15 1251    Clinical Impression Statement pt continues to progress well through strengthening and stability of the R knee. pt has full knee extension following manual therapy.    Rehab Potential Excellent   Clinical Impairments Affecting Rehab Potential prior knee surgery   PT Frequency 2x / week   PT Duration 4 weeks   PT Treatment/Interventions ADLs/Self Care Home Management;Cryotherapy;Electrical Stimulation;Neuromuscular re-education;Balance training;Therapeutic exercise;Therapeutic activities;Functional mobility training;Stair training;Gait training;Manual techniques;Passive range of motion;Dry needling;Taping   PT Home Exercise Plan Heel slide, SAQ, knee extension stretch, SLR   Consulted and Agree with Plan of Care Patient      Patient will benefit from skilled therapeutic intervention in order to improve the following deficits and impairments:  Abnormal gait, Decreased endurance, Decreased mobility, Decreased range of motion, Decreased strength, Decreased activity tolerance, Increased edema  Visit Diagnosis: Pain in right knee     Problem List Patient Active Problem List   Diagnosis Date Noted  . Obese 08/23/2015  . 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 Edema 08/17/2015  . Generalized anxiety disorder 08/17/2015  . Depression 08/17/2015  . RLQ abdominal pain 03/29/2015  . S/P right knee arthroscopy 10/06/2013  . SPLENOMEGALY 10/15/2008  . ESOPHAGITIS 10/14/2008  . NAUSEA 10/14/2008  . DYSPEPSIA 04/14/2008  . RECTAL  FISSURE 04/14/2008  . Abdominal pain, left upper quadrant 04/14/2008  . GERD 04/12/2008  . DUODENITIS 04/12/2008  . IRRITABLE BOWEL SYNDROME 04/12/2008  . ANEMIA, IRON DEFICIENCY, HX OF 04/12/2008  . ESOPHAGITIS, HX OF 04/12/2008   Gorden Harms. Jalal Rauch, PT, DPT [phone]  Jill Cook 11/17/2015, 12:54 PM  [place] MAIN [facility] SERVICES [place],  [place], [address] Phone: [phone]   Fax:  [phone]  Name: Jill Cook MRN: [id number] Date of Birth: 22-Jun-1966

## 2015-11-22 ENCOUNTER — Ambulatory Visit: Payer: 59

## 2015-11-22 DIAGNOSIS — M25561 Pain in right knee: Secondary | ICD-10-CM | POA: Diagnosis not present

## 2015-11-22 NOTE — Therapy (Signed)
[place] MAIN [facility] SERVICES [address] [place], [place], [address] Phone: [phone]   Fax:  [phone]  Physical Therapy Treatment  Patient Details  Name: Jill Cook MRN: [id number] Date of Birth: June 12, 1966 Referring Provider: Paralee Cancel  Encounter Date: 11/22/2015      PT End of Session - 11/22/15 1445    Visit Number 21   Number of Visits 29   Date for PT Re-Evaluation 12/06/15   PT Start Time 1116   PT Stop Time 1157   PT Time Calculation (min) 41 min   Activity Tolerance Patient tolerated treatment well   Behavior During Therapy [facility] for tasks assessed/performed      Past Medical History  Diagnosis Date  . Hypertension   . Diverticulosis   . History of esophagitis   . OA (osteoarthritis) of knee     RIGHT  . Arthritis     RIGHT HIP AND WRIST  . Anal fissure   . Anemia   . GERD (gastroesophageal reflux disease)     OTC PRN  . Anxiety     Past Surgical History  Procedure Laterality Date  . Cesarean section  1994  . Anal fissure repair  06-13-2000  . D & c hysteroscopy /  novasure endometrial ablation  11-15-2005  . Knee arthroscopy w/ meniscectomy Right   . Right knee patellofemoral arthroplasty  06-10-2008  . Bunionectomy Right 1991  . Septoplasty  2000  . Transthoracic echocardiogram  06-03-2008    NORMAL /  EF 60%  . Tubal ligation  1996  . Tonsillectomy  AGE 50  . Knee arthroscopy with medial menisectomy Right 10/06/2013    Procedure: RIGHT KNEE ARTHROSCOPY WITH CHONDROPLASTY;  Surgeon: Sydnee Cabal, MD;  Location: [facility];  Service: Orthopedics;  Laterality: Right;  . Conversion to total knee Right 08/22/2015    Procedure: partial knee CONVERSION TO RIGHT TOTAL KNEE;  Surgeon: Paralee Cancel, MD;  Location: [facility];  Service: Orthopedics;  Laterality: Right;  . Laparoscopy N/A 11/01/2015    Procedure: LAPAROSCOPY DIAGNOSTIC;  Surgeon: Molli Posey, MD;  Location: [facility];  Service: Gynecology;  Laterality: N/A;    There were no vitals filed for this visit.      Subjective Assessment - 11/22/15 1445    Subjective pt reports she feels like her knee is less swollen and moving better   Limitations Walking;Standing;Sitting   How long can you sit comfortably? pressure behind leg 5 to 10 min   How long can you walk comfortably? 30 min    Patient Stated Goals go back to gym, get back to work   Currently in Pain? No/denies   Pain Onset 1 to 4 weeks ago       manual therapy  R knee mobilizations AP and PA grades III and IV for 3x30 Proximal tib/fib mobs grade III 2 bouts 30s Passive R knee ROM into end range flexion /extension with small oscillations at end range x 20s X 10 reps each.  1-129 deg following    ThereX: SL leg press RLE 75lbs 2x15 BOSU squats flat down x 15 BOSU fwd step up RLE x 12 BOSU side step up x 12 each way BLE no Ue Standing resisted hip flexion, abduction ,adduction and extension with  red      Band 2x 10 each way standing on AIREX Pt requires min verbal and tactile cues for proper exercise performance  PT Long Term Goals - 11/08/15 1330    PT LONG TERM GOAL #1   Title pt will have increase R knee flexion AROM to 120 degrees for to descend stairs.   Baseline 3-118   Time 6   Period Weeks   Status Achieved   PT LONG TERM GOAL #2   Title pt will have a decrease in knee pain <2/10 in order to sleep thorugh the night without waking   Time 6   Period Weeks   Status Partially Met   PT LONG TERM GOAL #3   Title pt will be independent in HEP to improve strength, mobility and decrease pain for better functional independence with ADLs   Time 6   Period Weeks   Status Achieved   PT LONG TERM GOAL #4   Title pt will improve LEFS socre by 9 pts demonstrating a decrease in disability   Baseline 23   Time 6   Period Weeks   Status Achieved   PT LONG  TERM GOAL #5   Title pt will be able to stand for 1 hr without increased pain to be able to return to work    Time 4   Period Weeks   Status Partially Met               Plan - 11/22/15 1446    Clinical Impression Statement pt showed good improvement in ROM today with the R knee increasing to 1-129 deg. to continues to progress nicely with strengthening and proprioceptive activities. pt would beneift from continued skilled  PT services to maximzie her functional mobility before return to work   Rehab Potential Excellent   Clinical Impairments Affecting Rehab Potential prior knee surgery   PT Frequency 2x / week   PT Duration 4 weeks   PT Treatment/Interventions ADLs/Self Care Home Management;Cryotherapy;Electrical Stimulation;Neuromuscular re-education;Balance training;Therapeutic exercise;Therapeutic activities;Functional mobility training;Stair training;Gait training;Manual techniques;Passive range of motion;Dry needling;Taping   PT Home Exercise Plan Heel slide, SAQ, knee extension stretch, SLR   Consulted and Agree with Plan of Care Patient      Patient will benefit from skilled therapeutic intervention in order to improve the following deficits and impairments:  Abnormal gait, Decreased endurance, Decreased mobility, Decreased range of motion, Decreased strength, Decreased activity tolerance, Increased edema  Visit Diagnosis: Pain in right knee     Problem List Patient Active Problem List   Diagnosis Date Noted  . Obese 08/23/2015  . 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 Edema 08/17/2015  . Generalized anxiety disorder 08/17/2015  . Depression 08/17/2015  . RLQ abdominal pain 03/29/2015  . S/P right knee arthroscopy 10/06/2013  . SPLENOMEGALY 10/15/2008  . ESOPHAGITIS 10/14/2008  . NAUSEA 10/14/2008  . DYSPEPSIA 04/14/2008  . RECTAL FISSURE 04/14/2008  . Abdominal pain, left upper quadrant 04/14/2008  . GERD 04/12/2008  . DUODENITIS 04/12/2008  .  IRRITABLE BOWEL SYNDROME 04/12/2008  . ANEMIA, IRON DEFICIENCY, HX OF 04/12/2008  . ESOPHAGITIS, HX OF 04/12/2008   Gorden Harms. Aarit Kashuba, PT, DPT [phone]  Ivanka Kirshner 11/22/2015, 2:49 PM  [place] MAIN [facility] SERVICES [address] [place], [place], [address] Phone: [phone]   Fax:  [phone]  Name: Jill Cook MRN: [id number] Date of Birth: [birth year]

## 2015-11-24 ENCOUNTER — Ambulatory Visit: Payer: 59

## 2015-11-24 DIAGNOSIS — M25561 Pain in right knee: Secondary | ICD-10-CM | POA: Diagnosis not present

## 2015-11-24 NOTE — Therapy (Signed)
[place] MAIN [facility] SERVICES [address] [place], [place], [address] Phone: [phone]   Fax:  [phone]  Physical Therapy Treatment / discharge summary  Patient Details  Name: Jill Cook MRN: [id number] Date of Birth: [birth year] Referring Provider: Paralee Cancel  Encounter Date: 11/24/2015      PT End of Session - 11/24/15 1251    Visit Number 22   Number of Visits 29   Date for PT Re-Evaluation 12/06/15   PT Start Time 1115   PT Stop Time 1145   PT Time Calculation (min) 30 min   Activity Tolerance Patient tolerated treatment well   Behavior During Therapy [facility] for tasks assessed/performed      Past Medical History  Diagnosis Date  . Hypertension   . Diverticulosis   . History of esophagitis   . OA (osteoarthritis) of knee     RIGHT  . Arthritis     RIGHT HIP AND WRIST  . Anal fissure   . Anemia   . GERD (gastroesophageal reflux disease)     OTC PRN  . Anxiety     Past Surgical History  Procedure Laterality Date  . Cesarean section  1994  . Anal fissure repair  06-13-2000  . D & c hysteroscopy /  novasure endometrial ablation  11-15-2005  . Knee arthroscopy w/ meniscectomy Right   . Right knee patellofemoral arthroplasty  06-10-2008  . Bunionectomy Right 1991  . Septoplasty  2000  . Transthoracic echocardiogram  06-03-2008    NORMAL /  EF 60%  . Tubal ligation  1996  . Tonsillectomy  AGE 50  . Knee arthroscopy with medial menisectomy Right 10/06/2013    Procedure: RIGHT KNEE ARTHROSCOPY WITH CHONDROPLASTY;  Surgeon: Sydnee Cabal, MD;  Location: [facility];  Service: Orthopedics;  Laterality: Right;  . Conversion to total knee Right 08/22/2015    Procedure: partial knee CONVERSION TO RIGHT TOTAL KNEE;  Surgeon: Paralee Cancel, MD;  Location: [facility];  Service: Orthopedics;  Laterality: Right;  . Laparoscopy N/A 11/01/2015    Procedure: LAPAROSCOPY DIAGNOSTIC;  Surgeon: Molli Posey, MD;   Location: [facility];  Service: Gynecology;  Laterality: N/A;    There were no vitals filed for this visit.      Subjective Assessment - 11/24/15 1250    Subjective pt reports she  feels like she is ready to return to her PLOF in a graduated manner. no specific questions or concerns at this time.    Limitations Walking;Standing;Sitting   How long can you sit comfortably? pressure behind leg 5 to 10 min   How long can you walk comfortably? 30 min    Patient Stated Goals go back to gym, get back to work   Currently in Pain? Yes   Pain Score 2    Pain Location Knee   Pain Orientation Right   Pain Descriptors / Indicators Aching   Pain Onset 1 to 4 weeks ago       Manual therapy:  LEFS: 70/80 mild disability   R knee mobilizations AP and PA grades III and IV for 3x30 Proximal tib/fib mobs grade III 2 bouts 30s Passive R knee ROM into end range flexion /extension with small oscillations at end range x 20s X 10 reps each.  2-127 deg following   PT discussed exercise progression / core strengthening ( no chargE)  PT Education - 11/24/15 1251    Education provided Yes   Education Details gradual return to exercise and PLOF, core strenghtening suggestions   Person(s) Educated Patient   Methods Explanation   Comprehension Verbalized understanding             PT Long Term Goals - 11/24/15 1252    PT LONG TERM GOAL #1   Title pt will have increase R knee flexion AROM to 120 degrees for to descend stairs.   Baseline 3-118   Time 6   Period Weeks   Status Achieved   PT LONG TERM GOAL #2   Title pt will have a decrease in knee pain <2/10 in order to sleep thorugh the night without waking   Time 6   Period Weeks   Status Achieved   PT LONG TERM GOAL #3   Title pt will be independent in HEP to improve strength, mobility and decrease pain for better functional independence with ADLs   Time 6   Period Weeks   Status  Achieved   PT LONG TERM GOAL #4   Title pt will improve LEFS socre by 9 pts demonstrating a decrease in disability   Baseline 23   Time 6   Period Weeks   Status Achieved   PT LONG TERM GOAL #5   Title pt will be able to stand for 1 hr without increased pain to be able to return to work    Time 4   Period Weeks   Status Achieved               Plan - 11/24/15 1251    Clinical Impression Statement pt has achieved PT goals at this time and has made great progress with her R knee pain, ROM, strength and mobility. pt is independent wiht HEP and verbalizes understanding on how to independently manage her progress from this point. pt will be DC from PT as skilled services are no longer needed.    Rehab Potential Excellent   Clinical Impairments Affecting Rehab Potential prior knee surgery   PT Frequency 2x / week   PT Duration 4 weeks   PT Treatment/Interventions ADLs/Self Care Home Management;Cryotherapy;Electrical Stimulation;Neuromuscular re-education;Balance training;Therapeutic exercise;Therapeutic activities;Functional mobility training;Stair training;Gait training;Manual techniques;Passive range of motion;Dry needling;Taping   PT Home Exercise Plan Heel slide, SAQ, knee extension stretch, SLR   Consulted and Agree with Plan of Care Patient      Patient will benefit from skilled therapeutic intervention in order to improve the following deficits and impairments:  Abnormal gait, Decreased endurance, Decreased mobility, Decreased range of motion, Decreased strength, Decreased activity tolerance, Increased edema  Visit Diagnosis: Pain in right knee     Problem List Patient Active Problem List   Diagnosis Date Noted  . Obese 08/23/2015  . 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 Edema 08/17/2015  . Generalized anxiety disorder 08/17/2015  . Depression 08/17/2015  . RLQ abdominal pain 03/29/2015  . S/P right knee arthroscopy 10/06/2013  . SPLENOMEGALY  10/15/2008  . ESOPHAGITIS 10/14/2008  . NAUSEA 10/14/2008  . DYSPEPSIA 04/14/2008  . RECTAL FISSURE 04/14/2008  . Abdominal pain, left upper quadrant 04/14/2008  . GERD 04/12/2008  . DUODENITIS 04/12/2008  . IRRITABLE BOWEL SYNDROME 04/12/2008  . ANEMIA, IRON DEFICIENCY, HX OF 04/12/2008  . ESOPHAGITIS, HX OF 04/12/2008   Gorden Harms. Hollyn Stucky, PT, DPT [phone]  Dalya Maselli 11/24/2015, 12:53 PM  [place] MAIN [facility] SERVICES [place]  [place], [place], [address] Phone: [phone]   Fax:  [phone]  Name: SHERONDA PARRAN MRN: [id number] Date of Birth: [birth year]

## 2015-12-28 ENCOUNTER — Other Ambulatory Visit: Payer: Self-pay | Admitting: *Deleted

## 2015-12-28 NOTE — Patient Outreach (Signed)
[facility]) Care Management  12/28/2015  Jill Cook [birth year] [id number]   High Risk  RN attempted once again outreach call however only able to leave a HIPAA approved voice message requesting a call back. Will inquire further on pt's possible needs for available resources via [facility] services.  [name], RN Care Management Coordinator [place] Office [phone]

## 2015-12-28 NOTE — Patient Outreach (Addendum)
[place] [facility]) Care Management  12/27/2015  Jill Cook 1965/08/26 [id number]   High Risk  RN attempted to reach pt for possible resources or [facility] services however pt not available. RN able to leave a HIPAA approved voice message. Will continue outreach calls for [facility] services.  [name], RN Care Management Coordinator [place] Office [phone]

## 2015-12-30 ENCOUNTER — Encounter (HOSPITAL_BASED_OUTPATIENT_CLINIC_OR_DEPARTMENT_OTHER): Payer: Self-pay | Admitting: Obstetrics and Gynecology

## 2016-01-05 ENCOUNTER — Other Ambulatory Visit: Payer: Self-pay | Admitting: *Deleted

## 2016-01-05 NOTE — Patient Outreach (Signed)
[place] [facility]) Care Management  01/05/2016  Jill Cook 26-Jan-1966 [id number]  RN attempted three outreach attempts that were unsuccessful. Will close case from further contacts related to this referral.  [name], RN Care Management Coordinator [place] Office [phone]

## 2016-01-17 ENCOUNTER — Other Ambulatory Visit: Payer: Self-pay | Admitting: Physician Assistant

## 2016-01-17 NOTE — Telephone Encounter (Signed)
Refill appropriate and filled per protocol. 

## 2016-03-08 ENCOUNTER — Other Ambulatory Visit: Payer: Self-pay | Admitting: Family Medicine

## 2016-03-08 NOTE — Telephone Encounter (Signed)
Refill appropriate and filled per protocol. 

## 2016-05-24 ENCOUNTER — Telehealth: Payer: 59 | Admitting: Physician Assistant

## 2016-05-24 DIAGNOSIS — B373 Candidiasis of vulva and vagina: Secondary | ICD-10-CM

## 2016-05-24 DIAGNOSIS — B3731 Acute candidiasis of vulva and vagina: Secondary | ICD-10-CM

## 2016-05-24 MED ORDER — FLUCONAZOLE 150 MG PO TABS: 150.0000 mg | ORAL_TABLET | Freq: Once | ORAL | 0 refills | Status: AC

## 2016-05-24 NOTE — Progress Notes (Signed)
We are sorry that you are not feeling well. Here is how we plan to help! Based on what you shared with me it looks like you: May have a yeast vaginosis  Vaginosis is an inflammation of the vagina that can result in discharge, itching and pain. The cause is usually a change in the normal balance of vaginal bacteria or an infection. Vaginosis can also result from reduced estrogen levels after menopause.  The most common causes of vaginosis are:   Bacterial vaginosis which results from an overgrowth of one on several organisms that are normally present in your vagina.   Yeast infections which are caused by a naturally occurring fungus called candida.   Vaginal atrophy (atrophic vaginosis) which results from the thinning of the vagina from reduced estrogen levels after menopause.   Trichomoniasis which is caused by a parasite and is commonly transmitted by sexual intercourse.  Factors that increase your risk of developing vaginosis include: Marland Kitchen Medications, such as antibiotics and steroids . Uncontrolled diabetes . Use of hygiene products such as bubble bath, vaginal spray or vaginal deodorant . Douching . Wearing damp or tight-fitting clothing . Using an intrauterine device (IUD) for birth control . Hormonal changes, such as those associated with pregnancy, birth control pills or menopause . Sexual activity . Having a sexually transmitted infection  Your treatment plan is A single Diflucan (fluconazole) 150mg  tablet once.  I have electronically sent this prescription into the pharmacy that you have chosen.   You also need to schedule an appointment with your primary care provider to take a further look at the rash noted since Monday.  Be sure to take all of the medication as directed. Stop taking any medication if you develop a rash, tongue swelling or shortness of breath. Mothers who are breast feeding should consider pumping and discarding their breast milk while on these antibiotics.  However, there is no consensus that infant exposure at these doses would be harmful.  Remember that medication creams can weaken latex condoms. Marland Kitchen   HOME CARE:  Good hygiene may prevent some types of vaginosis from recurring and may relieve some symptoms:  . Avoid baths, hot tubs and whirlpool spas. Rinse soap from your outer genital area after a shower, and dry the area well to prevent irritation. Don't use scented or harsh soaps, such as those with deodorant or antibacterial action. Marland Kitchen Avoid irritants. These include scented tampons and pads. . Wipe from front to back after using the toilet. Doing so avoids spreading fecal bacteria to your vagina.  Other things that may help prevent vaginosis include:  Marland Kitchen Don't douche. Your vagina doesn't require cleansing other than normal bathing. Repetitive douching disrupts the normal organisms that reside in the vagina and can actually increase your risk of vaginal infection. Douching won't clear up a vaginal infection. . Use a latex condom. Both female and female latex condoms may help you avoid infections spread by sexual contact. . Wear cotton underwear. Also wear pantyhose with a cotton crotch. If you feel comfortable without it, skip wearing underwear to bed. Yeast thrives in [employer] Your symptoms should improve in the next day or two.  GET HELP RIGHT AWAY IF:  . You have pain in your lower abdomen ( pelvic area or over your ovaries) . You develop nausea or vomiting . You develop a fever . Your discharge changes or worsens . You have persistent pain with intercourse . You develop shortness of breath, a rapid pulse, or you faint.  These symptoms could be signs of problems or infections that need to be evaluated by a medical provider now.  MAKE SURE YOU    Understand these instructions.  Will watch your condition.  Will get help right away if you are not doing well or get worse.  Your e-visit answers were reviewed by a board  certified advanced clinical practitioner to complete your personal care plan. Depending upon the condition, your plan could have included both over the counter or prescription medications. Please review your pharmacy choice to make sure that you have choses a pharmacy that is open for you to pick up any needed prescription, Your safety is important to [place]. If you have drug allergies check your prescription carefully.   You can use MyChart to ask questions about today's visit, request a non-urgent call back, or ask for a work or school excuse for 24 hours related to this e-Visit. If it has been greater than 24 hours you will need to follow up with your provider, or enter a new e-Visit to address those concerns. You will get a MyChart message within the next two days asking about your experience. I hope that your e-visit has been valuable and will speed your recovery.

## 2016-06-12 ENCOUNTER — Other Ambulatory Visit: Payer: Self-pay | Admitting: Family Medicine

## 2016-06-27 ENCOUNTER — Ambulatory Visit (INDEPENDENT_AMBULATORY_CARE_PROVIDER_SITE_OTHER): Payer: 59

## 2016-06-27 ENCOUNTER — Encounter: Payer: Self-pay | Admitting: Podiatry

## 2016-06-27 ENCOUNTER — Ambulatory Visit (INDEPENDENT_AMBULATORY_CARE_PROVIDER_SITE_OTHER): Payer: 59 | Admitting: Podiatry

## 2016-06-27 DIAGNOSIS — M722 Plantar fascial fibromatosis: Secondary | ICD-10-CM

## 2016-06-27 MED ORDER — MELOXICAM 15 MG PO TABS: 15.0000 mg | ORAL_TABLET | Freq: Every day | ORAL | 3 refills | Status: DC

## 2016-06-27 MED ORDER — METHYLPREDNISOLONE 4 MG PO TBPK: ORAL_TABLET | ORAL | 0 refills | Status: DC

## 2016-06-27 NOTE — Progress Notes (Signed)
   Subjective:    Patient ID: Jill Cook, female    DOB: 17-Aug-1965, 50 y.o.   MRN: [id number]  HPI: She presents today with chief complaint of bilateral heel pain. She states the left is the worst. She denies any trauma. She states there is been going on now for about a year that even outside of the foot in the dorsolateral aspect of the starting to hurt. It has worsened over the past 3-4 months to the point where she can hardly work she states that the pain is constant while she is on them she also has morning pain but is worse toward the end of the day. 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  Review of Systems  All other systems reviewed and are negative.      Objective:   Physical Exam: Vital signs are stable alert and oriented 3 pulses are palpable. Neurologic sensorium is intact deep tendon reflexes are intact muscle strength is intact bilateral. Orthopedic evaluation shows all joints to make a full range of motion without crepitation. She also has pain on palpation medial calcaneal tubercles bilateral. She has an area that appears to be either fibrotic or calcified in the plantar lateral left heel. Radiographs 3 views bilateral foot taken today demonstrate soft tissue increase in density plantar fascia calcaneal insertion site significant calcification of the proximal plantar fascia. She also has some type of reactive change to the plantar aspect of the heel as well as the os perineum.        Assessment & Plan:  Plantar fasciitis with calcification left heel plantar fascia pain right heel.  Plan: Start her on a Medrol Dosepak to be followed by meloxicam. Injected bilateral heels with Kenalog and local anesthetic. Posterior plantar fascia brace and a night splint. Discussed for a few history of sinusitis therapy follow-up with her in 1 month.

## 2016-06-27 NOTE — Patient Instructions (Signed)

## 2016-08-01 ENCOUNTER — Encounter: Payer: Self-pay | Admitting: Podiatry

## 2016-08-01 ENCOUNTER — Ambulatory Visit (INDEPENDENT_AMBULATORY_CARE_PROVIDER_SITE_OTHER): Payer: 59 | Admitting: Podiatry

## 2016-08-01 DIAGNOSIS — M722 Plantar fascial fibromatosis: Secondary | ICD-10-CM | POA: Diagnosis not present

## 2016-08-01 NOTE — Progress Notes (Signed)
She presents today for follow-up of her plantar fasciitis of the bilateral foot. She states that the left foot still bothers her considerably. She continues her plantar fascia braces and night splint as well as her meloxicam.  Objective: Vital signs are stable she is alert and oriented 3. Pulses are palpable. She is pain on palpation medially over Daniel tubercles bilateral. Left worse than right.  Assessment: Pain limb secondary to plantar fasciitis.  Plan: Reinjected the bilateral heels today with Kenalog and local anesthetic she will continue to braces night splint and medications. We will consider orthotics next visit.

## 2016-08-08 NOTE — Progress Notes (Deleted)
Cardiology Office Note    Date:  08/08/2016   ID:  Jill, Cook Mar 04, 1966, MRN [id number]  PCP:  Odette Fraction, MD  Cardiologist:  New  CC: Palpitations  History of Present Illness:  Jill Cook is a 51 y.o. female with a history of HTN and GERD who presents to clinic for new patient evaluation of palpitations and chest discomfort.    Past Medical History:  Diagnosis Date  . Anal fissure   . Anemia   . Anxiety   . Arthritis    RIGHT HIP AND WRIST  . Diverticulosis   . GERD (gastroesophageal reflux disease)    OTC PRN  . History of esophagitis   . Hypertension   . OA (osteoarthritis) of knee    RIGHT    Past Surgical History:  Procedure Laterality Date  . ANAL FISSURE REPAIR  06-13-2000  . BUNIONECTOMY Right 1991  . CESAREAN SECTION  1994  . 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 D & C HYSTEROSCOPY /  NOVASURE ENDOMETRIAL ABLATION  11-15-2005  . KNEE ARTHROSCOPY W/ MENISCECTOMY Right   . KNEE ARTHROSCOPY WITH MEDIAL MENISECTOMY Right 10/06/2013   Procedure: RIGHT KNEE ARTHROSCOPY WITH CHONDROPLASTY;  Surgeon: Sydnee Cabal, MD;  Location: [facility];  Service: Orthopedics;  Laterality: Right;  . LAPAROSCOPY N/A 11/01/2015   Procedure: LAPAROSCOPY DIAGNOSTIC;  Surgeon: Molli Posey, MD;  Location: [facility];  Service: Gynecology;  Laterality: N/A;  . RIGHT KNEE PATELLOFEMORAL ARTHROPLASTY  06-10-2008  . SEPTOPLASTY  2000  . TONSILLECTOMY  AGE 59  . TRANSTHORACIC ECHOCARDIOGRAM  06-03-2008   NORMAL /  EF 60%  . TUBAL LIGATION  1996    Current Medications: Outpatient Medications Prior to Visit  Medication Sig Dispense Refill  . buPROPion (WELLBUTRIN XL) 300 MG 24 hr tablet TAKE 1 TABLET BY MOUTH ONCE DAILY 30 tablet 3  . Calcium Carb-Cholecalciferol  (CALCIUM-VITAMIN D) 600-400 MG-UNIT TABS Take 1 tablet by mouth daily.    . cetirizine (ZYRTEC) 10 MG tablet Take 10 mg by mouth daily.    Marland Kitchen docusate sodium (COLACE) 100 MG capsule Take 1 capsule (100 mg total) by mouth 2 (two) times daily. 10 capsule 0  . lisinopril (PRINIVIL,ZESTRIL) 10 MG tablet TAKE 1 TABLET BY MOUTH ONCE DAILY 30 tablet 10  . meloxicam (MOBIC) 15 MG tablet Take 1 tablet (15 mg total) by mouth daily. 30 tablet 3  . polyethylene glycol (MIRALAX / GLYCOLAX) packet Take 17 g by mouth 2 (two) times daily. 14 each 0   No facility-administered medications prior to visit.      Allergies:   Codeine; Hydrocodone; Hydromorphone hcl; and Morphine   Social History   Social History  . Marital status: Married    Spouse name: N/A  . Number of children: 3  . 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 Smoking status: Never Smoker  . Smokeless tobacco: Never Used  . Alcohol use Yes     Comment: OccasionaL  . Drug use: No  . Sexual activity: Not on file   Other Topics Concern  . Not on file   Social History Narrative  . No narrative on file     Family History:  The patient's ***family history includes Colon cancer in her maternal aunt; Diabetes in her paternal grandfather; Esophageal cancer in her maternal grandfather; Heart disease in her father; Hypertension in her mother and paternal grandfather; Thyroid disease in her mother; Uterine cancer in her paternal aunt.      *** ROS/PE    Wt Readings from Last 3 Encounters:  11/01/15 214 lb 8 oz (97.3 kg)  08/22/15 218 lb (98.9 kg)  08/18/15 218 lb (98.9 kg)      Studies/Labs Reviewed:   EKG:  EKG is*** ordered today.  The ekg ordered today demonstrates ***  Recent Labs: 08/23/2015: Platelets 160 11/01/2015: BUN 16; Creatinine, Ser 1.00; Hemoglobin 11.6; Potassium 4.0; Sodium 143   Lipid Panel    Component Value Date/Time   CHOL  164 07/13/2013 1209   TRIG 138 07/13/2013 1209   HDL 41 07/13/2013 1209   CHOLHDL 4.0 07/13/2013 1209   VLDL 28 07/13/2013 1209   [place] 95 07/13/2013 1209    Additional studies/ records that were reviewed today include:  ***   ASSESSMENT & PLAN:       Medication Adjustments/Labs and Tests Ordered: Current medicines are reviewed at length with the patient today.  Concerns regarding medicines are outlined above.  Medication changes, Labs and Tests ordered today are listed in the Patient Instructions below. There are no Patient Instructions on file for this visit.   Signed, Angelena Form, PA-C  08/08/2016 8:42 AM    [facility] [address], [place], [place]  [address] Phone: [phone]; Fax: [phone]

## 2016-08-09 ENCOUNTER — Ambulatory Visit: Payer: 59 | Admitting: Physician Assistant

## 2016-08-27 DIAGNOSIS — T1511XA Foreign body in conjunctival sac, right eye, initial encounter: Secondary | ICD-10-CM | POA: Diagnosis not present

## 2016-09-05 ENCOUNTER — Ambulatory Visit: Payer: 59 | Admitting: Podiatry

## 2016-09-13 DIAGNOSIS — H524 Presbyopia: Secondary | ICD-10-CM | POA: Diagnosis not present

## 2016-09-17 ENCOUNTER — Ambulatory Visit: Payer: 59 | Admitting: Podiatry

## 2016-09-19 ENCOUNTER — Ambulatory Visit (INDEPENDENT_AMBULATORY_CARE_PROVIDER_SITE_OTHER): Payer: 59 | Admitting: Podiatry

## 2016-09-19 ENCOUNTER — Encounter: Payer: Self-pay | Admitting: Podiatry

## 2016-09-19 DIAGNOSIS — M722 Plantar fascial fibromatosis: Secondary | ICD-10-CM | POA: Diagnosis not present

## 2016-09-19 NOTE — Progress Notes (Signed)
She presents today states that she still has some pain in the left heel with the right was doing just great.  Objective: Vital signs are stable she is alert and oriented 3. She has no pain on palpation medially continue tubercle of the right heel some pain on palpation of the left heel. However it does seem to be better than it was previously.  Assessment: Well-healing plantar fasciitis right recalcitrant plantar fasciitis left.  Plan: I feel the best thing to do at this point rather than another injection would be to get her into a pair of orthotics. So she was scanned today for set of orthotics and I will follow-up with her in the near future once those orthotics arrive.

## 2016-10-17 ENCOUNTER — Ambulatory Visit: Payer: 59 | Admitting: Podiatry

## 2016-10-22 ENCOUNTER — Emergency Department
Admission: EM | Admit: 2016-10-22 | Discharge: 2016-10-22 | Disposition: A | Discharge status: Home / Self Care | Payer: 59 | Attending: Emergency Medicine | Admitting: Emergency Medicine

## 2016-10-22 ENCOUNTER — Emergency Department: Payer: 59

## 2016-10-22 DIAGNOSIS — R0602 Shortness of breath: Secondary | ICD-10-CM | POA: Diagnosis not present

## 2016-10-22 DIAGNOSIS — I10 Essential (primary) hypertension: Secondary | ICD-10-CM | POA: Diagnosis not present

## 2016-10-22 DIAGNOSIS — R079 Chest pain, unspecified: Secondary | ICD-10-CM | POA: Diagnosis not present

## 2016-10-22 DIAGNOSIS — R0789 Other chest pain: Secondary | ICD-10-CM | POA: Diagnosis not present

## 2016-10-22 LAB — BASIC METABOLIC PANEL
Anion gap: 6 (ref 5–15)
BUN: 18 mg/dL (ref 6–20)
CALCIUM: 9.1 mg/dL (ref 8.9–10.3)
CHLORIDE: 103 mmol/L (ref 101–111)
CO2: 27 mmol/L (ref 22–32)
Creatinine, Ser: 0.83 mg/dL (ref 0.44–1.00)
GFR calc non Af Amer: >60 mL/min (ref >60)
Glucose, Bld: 95 mg/dL (ref 65–99)
Potassium: 3.8 mmol/L (ref 3.5–5.1)
Sodium: 136 mmol/L (ref 135–145)

## 2016-10-22 LAB — CBC
HCT: 35.3 % (ref 35.0–47.0)
HEMOGLOBIN: 12.5 g/dL (ref 12.0–16.0)
MCH: 30 pg (ref 26.0–34.0)
MCHC: 35.5 g/dL (ref 32.0–36.0)
MCV: 84.4 fL (ref 80.0–100.0)
Platelets: 180 10*3/uL (ref 150–440)
RBC: 4.18 MIL/uL (ref 3.80–5.20)
RDW: 14.1 % (ref 11.5–14.5)
WBC: 6.2 10*3/uL (ref 3.6–11.0)

## 2016-10-22 LAB — FIBRIN DERIVATIVES D-DIMER (ARMC ONLY): Fibrin derivatives D-dimer (ARMC): 1409.68 — ABNORMAL HIGH (ref 0.00–499.00)

## 2016-10-22 LAB — TROPONIN I

## 2016-10-22 MED ORDER — IOPAMIDOL (ISOVUE-370) INJECTION 76%
75.0000 mL | Freq: Once | INTRAVENOUS | Status: AC | PRN
  Administered 2016-10-22: 75 mL via INTRAVENOUS

## 2016-10-22 NOTE — ED Notes (Signed)
Pt taken to CT.

## 2016-10-22 NOTE — ED Provider Notes (Signed)
[facility] Emergency Department Provider Note        Time seen: ----------------------------------------- 8:07 AM on 10/22/2016 -----------------------------------------    I have reviewed the triage vital signs and the nursing notes.   HISTORY   Chief Complaint Chest Pain    HPI Jill Cook is a 51 y.o. female who presents to the ED for chest pain and shortness of breath that started while she was leaving work. She was walking to her car this morning when the symptoms started. Patient states the pain was 7 out of 10. Patient states the pain was like a pressure, nothing made it better or worse. Currently the pain is 1 out of 10 and has essentially resolved spontaneously. She has never felt this way before.   Past Medical History:  Diagnosis Date  . Anal fissure   . Anemia   . Anxiety   . Arthritis    RIGHT HIP AND WRIST  . Diverticulosis   . GERD (gastroesophageal reflux disease)    OTC PRN  . History of esophagitis   . Hypertension   . OA (osteoarthritis) of knee    RIGHT    Patient Active Problem List   Diagnosis Date Noted  . Obese 08/23/2015  . 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 Edema 08/17/2015  . Generalized anxiety disorder 08/17/2015  . Depression 08/17/2015  . RLQ abdominal pain 03/29/2015  . S/P right knee arthroscopy 10/06/2013  . SPLENOMEGALY 10/15/2008  . ESOPHAGITIS 10/14/2008  . NAUSEA 10/14/2008  . DYSPEPSIA 04/14/2008  . RECTAL FISSURE 04/14/2008  . Abdominal pain, left upper quadrant 04/14/2008  . GERD 04/12/2008  . DUODENITIS 04/12/2008  . IRRITABLE BOWEL SYNDROME 04/12/2008  . ANEMIA, IRON DEFICIENCY, HX OF 04/12/2008  . ESOPHAGITIS, HX OF 04/12/2008    Past Surgical History:  Procedure Laterality Date  . ANAL FISSURE REPAIR  06-13-2000  . BUNIONECTOMY Right 1991  . CESAREAN SECTION  1994  . CONVERSION TO TOTAL KNEE Right 08/22/2015   Procedure: partial knee CONVERSION TO RIGHT  TOTAL KNEE;  Surgeon: Paralee Cancel, MD;  Location: [facility];  Service: Orthopedics;  Laterality: Right;  . D & C HYSTEROSCOPY /  NOVASURE ENDOMETRIAL ABLATION  11-15-2005  . KNEE ARTHROSCOPY W/ MENISCECTOMY Right   . KNEE ARTHROSCOPY WITH MEDIAL MENISECTOMY Right 10/06/2013   Procedure: RIGHT KNEE ARTHROSCOPY WITH CHONDROPLASTY;  Surgeon: Sydnee Cabal, MD;  Location: [facility];  Service: Orthopedics;  Laterality: Right;  . LAPAROSCOPY N/A 11/01/2015   Procedure: LAPAROSCOPY DIAGNOSTIC;  Surgeon: Molli Posey, MD;  Location: [facility];  Service: Gynecology;  Laterality: N/A;  . RIGHT KNEE PATELLOFEMORAL ARTHROPLASTY  06-10-2008  . SEPTOPLASTY  2000  . TONSILLECTOMY  AGE 41  . TRANSTHORACIC ECHOCARDIOGRAM  06-03-2008   NORMAL /  EF 60%  . TUBAL LIGATION  1996    Allergies Codeine; Hydrocodone; Hydromorphone hcl; and Morphine  Social History Social History  Substance Use Topics  . Smoking status: Never Smoker  . Smokeless tobacco: Never Used  . Alcohol use Yes     Comment: OccasionaL    Review of Systems Constitutional: Negative for fever. Cardiovascular: Positive for chest pain Respiratory: Positive for shortness of breath Gastrointestinal: Negative for abdominal pain, vomiting and diarrhea. Genitourinary: Negative for dysuria. Musculoskeletal: Negative for back pain. Skin: Negative for rash. Neurological: Negative for headaches, focal weakness or numbness.  10-point ROS otherwise negative.  ____________________________________________   PHYSICAL EXAM:  VITAL SIGNS: ED Triage Vitals  Enc Vitals Group     BP 10/22/16 0745 (!) 160/83     Pulse Rate 10/22/16 0745 87     Resp 10/22/16 0745 (!) 22     Temp 10/22/16 0745 97.6 F (36.4 C)     Temp Source 10/22/16 0745 Oral     SpO2 10/22/16 0745 100 %     Weight 10/22/16 0745 195 lb (88.5 kg)     Height 10/22/16 0745 5\' 6"  (1.676 m)     Head Circumference --      Peak Flow --       Pain Score 10/22/16 0746 7     Pain Loc --      Pain Edu? --      Excl. in [place]? --     Constitutional: Alert and oriented. Well appearing and in no distress. Eyes: Conjunctivae are normal. PERRL. Normal extraocular movements. ENT   Head: Normocephalic and atraumatic.   Nose: No congestion/rhinnorhea.   Mouth/Throat: Mucous membranes are moist.   Neck: No stridor. Cardiovascular: Normal rate, regular rhythm. No murmurs, rubs, or gallops. Respiratory: Normal respiratory effort without tachypnea nor retractions. Breath sounds are clear and equal bilaterally. No wheezes/rales/rhonchi. Gastrointestinal: Soft and nontender. Normal bowel sounds Musculoskeletal: Nontender with normal range of motion in extremities. No lower extremity tenderness nor edema. Neurologic:  Normal speech and language. No gross focal neurologic deficits are appreciated.  Skin:  Skin is warm, dry and intact. No rash noted. Psychiatric: Mood and affect are normal. Speech and behavior are normal.  ____________________________________________  EKG: Interpreted by me. Sinus rhythm rate 85 bpm, normal PR interval, normal QRS, normal QT, normal axis.  ____________________________________________  ED COURSE:  Pertinent labs & imaging results that were available during my care of the patient were reviewed by me and considered in my medical decision making (see chart for details). Patient presents for chest pain and shortness of breath, we will assess with labs and imaging if needed.   Procedures ____________________________________________   LABS (pertinent positives/negatives)  Labs Reviewed  FIBRIN DERIVATIVES D-DIMER ([facility] ONLY) - Abnormal; Notable for the following:       Result Value   Fibrin derivatives D-dimer [facility]) 1,409.68 (*)    All other components within normal limits  BASIC METABOLIC PANEL  CBC  TROPONIN I  TROPONIN I    RADIOLOGY  Chest x-ray Is  unremarkable ____________________________________________  FINAL ASSESSMENT AND PLAN  Chest pain  Plan: Patient's labs and imaging were dictated above. Patient had presented for Chest pain of uncertain etiology. D-dimer was noted to be elevated but no clot was found on CT angiogram. No dissection was visualized. Repeat troponin was negative, she is stable for outpatient follow-up.   Earleen Newport, MD   Note: This note was generated in part or whole with voice recognition software. Voice recognition is usually quite accurate but there are transcription errors that can and very often do occur. I apologize for any typographical errors that were not detected and corrected.     Earleen Newport, MD 10/22/16 [phone]

## 2016-10-22 NOTE — ED Triage Notes (Signed)
Pt c/o chest pain with SOB that started while she was leaving work, walking to her car this morning . States pain is between epigastric and substernal chest..

## 2016-10-25 ENCOUNTER — Telehealth: Payer: Self-pay

## 2016-10-25 NOTE — Telephone Encounter (Signed)
lmov for patient to call back and schedule follow up  She was seen on 10/22/16 for chest pain  Will try again at a later time

## 2016-10-31 ENCOUNTER — Other Ambulatory Visit: Payer: Self-pay | Admitting: Family Medicine

## 2016-10-31 NOTE — Telephone Encounter (Signed)
Medication refill for one time only.  Patient needs to be seen.  Letter sent for patient to call and schedule 

## 2016-11-07 ENCOUNTER — Ambulatory Visit (INDEPENDENT_AMBULATORY_CARE_PROVIDER_SITE_OTHER): Payer: 59 | Admitting: Podiatry

## 2016-11-07 ENCOUNTER — Encounter: Payer: Self-pay | Admitting: Podiatry

## 2016-11-07 DIAGNOSIS — M722 Plantar fascial fibromatosis: Secondary | ICD-10-CM

## 2016-11-07 NOTE — Patient Instructions (Signed)

## 2016-11-07 NOTE — Progress Notes (Signed)
Dispensed patient's orthotics with oral and written instructions for wearing. Patient will follow up with Dr. Hyatt in 6 weeks for an orthotic check. 

## 2016-11-14 ENCOUNTER — Encounter: Payer: Self-pay | Admitting: *Deleted

## 2016-11-14 ENCOUNTER — Encounter: Payer: Self-pay | Admitting: Internal Medicine

## 2016-11-14 ENCOUNTER — Ambulatory Visit (INDEPENDENT_AMBULATORY_CARE_PROVIDER_SITE_OTHER): Payer: 59 | Admitting: Internal Medicine

## 2016-11-14 VITALS — BP 114/62 | HR 82 | Ht 66.0 in | Wt 223.2 lb

## 2016-11-14 DIAGNOSIS — R0789 Other chest pain: Secondary | ICD-10-CM

## 2016-11-14 DIAGNOSIS — Z13220 Encounter for screening for lipoid disorders: Secondary | ICD-10-CM | POA: Diagnosis not present

## 2016-11-14 DIAGNOSIS — R002 Palpitations: Secondary | ICD-10-CM | POA: Diagnosis not present

## 2016-11-14 DIAGNOSIS — R202 Paresthesia of skin: Secondary | ICD-10-CM | POA: Diagnosis not present

## 2016-11-14 DIAGNOSIS — I10 Essential (primary) hypertension: Secondary | ICD-10-CM

## 2016-11-14 NOTE — Progress Notes (Signed)
New Outpatient Visit Date: 11/14/2016  Primary Care Provider: Susy Frizzle, MD [address] [place], [place] [address]  Chief Complaint: Chest pain  HPI:  Jill Cook is a 51 y.o. female who is being seen today for the evaluation of chest pain following a recent ED evaluation. She has a history of GERD, anxiety, and depression. She developed acute chest pain while walking from [facility] to her car after an overnight shift last month (10/22/16). She had been feeling well earlier in the morning but developed sharp substernal pain radiating to her back while walking in the parking lot, with an intensity of 8/10. She continued to feel "squeezing" between her sternum and back for about an hour, at which time the pain spontaneously resolved. She experienced shortness of breath with the pain and presented to the [facility] ED for further evaluation. Workup was notable for negative TnI x 2 and normal EKG. D-dimer was mildly elevated but subsequent CTA chest was negative for PE and aortic dissection.  Jill Cook has not had any further episodes of chest pain. She has experienced frequently palpitations recently, often when she feels nervous. She denies accompanying symptoms like chest pain, dyspnea, and lightheadedness. She reports a negative cardiac workup, including stress test, about 10 years in anticipation of knee surgery. She stopped drinking caffeine about 3-4 months ago and has noticed improvement in her hot flashes but not the aforementioned palpitations.  The patient also describes a brief episode of left face and arm tingling/quivering while at work a few weeks ago. The sensation resolved spontaneously after ~45 minutes. She did not have any weakness nor has the tingling/quivering recurred. However, out of concern for TIA, she has been taking aspirin for the last few weeks.  --------------------------------------------------------------------------------------------------  Cardiovascular History  & Procedures: Cardiovascular Problems:  Atypical chest pain  Palpitations  Questionable TIA  Risk Factors:  None  Cath/PCI:  None  CV Surgery:  None  EP Procedures and Devices:  None  Non-Invasive Evaluation(s):  Patient reports stress test ~10 years ago in [place]; she believes it was normal  Recent CV Pertinent Labs: Lab Results  Component Value Date   CHOL 164 07/13/2013   HDL 41 07/13/2013   LDLCALC 95 07/13/2013   TRIG 138 07/13/2013   CHOLHDL 4.0 07/13/2013   INR 1.12 08/18/2015   K 3.8 10/22/2016   BUN 18 10/22/2016   CREATININE 0.83 10/22/2016   CREATININE 0.89 07/07/2015    --------------------------------------------------------------------------------------------------  Past Medical History:  Diagnosis Date  . Anal fissure   . Anemia   . Anxiety   . Arthritis    RIGHT HIP AND WRIST  . Diverticulosis   . GERD (gastroesophageal reflux disease)    OTC PRN  . History of esophagitis   . Hypertension   . OA (osteoarthritis) of knee    RIGHT    Past Surgical History:  Procedure Laterality Date  . ANAL FISSURE REPAIR  06-13-2000  . BUNIONECTOMY Right 1991  . CESAREAN SECTION  1994  . 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 D & C HYSTEROSCOPY /  NOVASURE ENDOMETRIAL ABLATION  11-15-2005  . KNEE ARTHROSCOPY W/ MENISCECTOMY Right   . KNEE ARTHROSCOPY WITH MEDIAL MENISECTOMY Right 10/06/2013   Procedure: RIGHT KNEE ARTHROSCOPY WITH CHONDROPLASTY;  Surgeon: Sydnee Cabal, MD;  Location: [facility];  Service: Orthopedics;  Laterality: Right;  . LAPAROSCOPY N/A 11/01/2015   Procedure: LAPAROSCOPY DIAGNOSTIC;  Surgeon: Molli Posey, MD;  Location: [facility];  Service: Gynecology;  Laterality: N/A;  . RIGHT KNEE PATELLOFEMORAL ARTHROPLASTY  06-10-2008  .  SEPTOPLASTY  2000  . TONSILLECTOMY  AGE 45  . TRANSTHORACIC ECHOCARDIOGRAM  06-03-2008   NORMAL /  EF 60%  . TUBAL LIGATION  1996    Outpatient Encounter Prescriptions as of 11/14/2016  Medication Sig  . buPROPion (WELLBUTRIN XL) 300 MG 24 hr tablet TAKE 1 TABLET BY MOUTH ONCE DAILY  . Calcium Carb-Cholecalciferol (CALCIUM-VITAMIN D) 600-400 MG-UNIT TABS Take 1 tablet by mouth daily.  . cetirizine (ZYRTEC) 10 MG tablet Take 10 mg by mouth daily.  Marland Kitchen docusate sodium (COLACE) 100 MG capsule Take 1 capsule (100 mg total) by mouth 2 (two) times daily.  Marland Kitchen lisinopril (PRINIVIL,ZESTRIL) 10 MG tablet TAKE 1 TABLET BY MOUTH ONCE DAILY  . polyethylene glycol (MIRALAX / GLYCOLAX) packet Take 17 g by mouth 2 (two) times daily.  . [DISCONTINUED] meloxicam (MOBIC) 15 MG tablet Take 1 tablet (15 mg total) by mouth daily. (Patient not taking: Reported on 10/22/2016)   No facility-administered encounter medications on file as of 11/14/2016.     Allergies: Codeine; Hydrocodone; Hydromorphone hcl; and Morphine  Social History   Social History  . Marital status: Married    Spouse name: N/A  . Number of children: 3  . 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 Smoking status: Never Smoker  . Smokeless tobacco: Never Used  . Alcohol use Yes     Comment: Occasional  . Drug use: No  . Sexual activity: Not on file   Other Topics Concern  . Not on file   Social History Narrative  . No narrative on file    Family History  Problem Relation Age of Onset  . Hypertension Mother   . Thyroid disease Mother   . Heart disease Father   . Hypertension Paternal Grandfather   . Diabetes Paternal Grandfather   . Uterine cancer Paternal Aunt   . Colon cancer Maternal Aunt     dx in her 89's  . Esophageal cancer Maternal Grandfather     Review of Systems: A 12-system review of systems was performed and was negative except  as noted in the HPI.  --------------------------------------------------------------------------------------------------  Physical Exam: BP 114/62 (BP Location: Right Arm, Patient Position: Sitting, Cuff Size: Normal)   Pulse 82   Ht 5\' 6"  (1.676 m)   Wt 223 lb 4 oz (101.3 kg)   BMI 36.03 kg/m   General:  Obese woman, seated comfortably in the exam room. HEENT: No conjunctival pallor or scleral icterus.  Moist mucous membranes.  OP clear. Neck: Supple without lymphadenopathy, thyromegaly, JVD, or HJR.  No carotid bruit. Lungs: Normal work of breathing.  Clear to auscultation bilaterally without wheezes or crackles. Heart: Regular rate and rhythm without murmurs, rubs, or gallops.  Non-displaced PMI. Abd: Bowel sounds present.  Soft, NT/ND without hepatosplenomegaly Ext: No lower extremity edema.  Radial, PT, and DP pulses are 2+ bilaterally Skin: warm and dry without rash Neuro: CNIII-XII intact.  Strength and fine-touch sensation intact in upper and lower extremities bilaterally. Psych: Normal mood and affect.  EKG:  Normal sinus rhythm without significant abnormalities.  Lab Results  Component Value Date   WBC 6.2 10/22/2016  HGB 12.5 10/22/2016   HCT 35.3 10/22/2016   MCV 84.4 10/22/2016   PLT 180 10/22/2016    Lab Results  Component Value Date   NA 136 10/22/2016   K 3.8 10/22/2016   CL 103 10/22/2016   CO2 27 10/22/2016   BUN 18 10/22/2016   CREATININE 0.83 10/22/2016   GLUCOSE 95 10/22/2016   ALT 13 07/07/2015    Lab Results  Component Value Date   CHOL 164 07/13/2013   HDL 41 07/13/2013   LDLCALC 95 07/13/2013   TRIG 138 07/13/2013   CHOLHDL 4.0 07/13/2013   --------------------------------------------------------------------------------------------------  ASSESSMENT AND PLAN: Atypical chest pain Jill Cook has experienced only one episode of chest pain. ED evaluation was unrevealing. I have personally viewed the CT images, which show no PE or aortic  dissection. Heart size appears normal without significant coronary artery calcification. We have agreed to perform an exercise tolerance test for further evaluation. We will also check a fasting lipid panel when she returns for the ETT. I encouraged Jill Cook to continue low-dose aspirin pending the ETT.  Paresthesias The left facial and arm tingling/quivering is non-specific and has only occurred on one occasion. He exam today is unremarkable. TIA is possible, though her risk factors are relatively low. We will discuss carotid Dopplers after completion of the ETT.  Palpitations Intermittently present in the past but more frequent recently. EKG is normal today. Recent labs during ED visit showed normal electrolytes. We will check a TSH when Jill Cook returns for lipid panel and ETT. We have agreed to defer echo and event monitor pending the ETT.  Hypertension: Blood pressure is well-controlled today. Jill Cook can continue her current dose of lisinopril.  Follow-up: Return to clinic in 1 month.  Nelva Bush, MD 11/15/2016 4:14 PM

## 2016-11-14 NOTE — Patient Instructions (Signed)
Medication Instructions:  Your physician recommends that you continue on your current medications as directed. Please refer to the Current Medication list given to you today.   Labwork: Your physician recommends that you return for lab work in: [place] TEST.  (LIPID) - You will need to be FASTING. Do not eat or drink after midnight the morning of the lab work. - You may bring a snack to eat prior to the stress test after labs are drawn if needed.   Testing/Procedures: Your physician has requested that you have an exercise tolerance test. For further information please visit [email or web address]. Please also follow instruction sheet, as given.    Follow-Up: Your physician recommends that you schedule a follow-up appointment in: [place].    Exercise Stress Electrocardiogram An exercise stress electrocardiogram is a test that is done to evaluate the blood supply to your heart. This test may also be called exercise stress electrocardiography. The test is done while you are walking on a treadmill. The goal of this test is to raise your heart rate. This test is done to find areas of poor blood flow to the heart by determining the extent of coronary artery disease (CAD). CAD is defined as narrowing in one or more heart (coronary) arteries of more than 70%. If you have an abnormal test result, this may mean that you are not getting adequate blood flow to your heart during exercise. Additional testing may be needed to understand why your test was abnormal. Tell a health care provider about:  Any allergies you have.  All medicines you are taking, including vitamins, herbs, eye drops, creams, and over-the-counter medicines.  Any problems you or family members have had with anesthetic medicines.  Any blood disorders you have.  Any surgeries you have had.  Any medical conditions you have.  Possibility of pregnancy, if this applies. What are the  risks? Generally, this is a safe procedure. However, as with any procedure, complications can occur. Possible complications can include:  Pain or pressure in the following areas:  Chest.  Jaw or neck.  Between your shoulder blades.  Radiating down your left arm.  Dizziness or light-headedness.  Shortness of breath.  Increased or irregular heartbeats.  Nausea or vomiting.  Heart attack (rare). What happens before the procedure?  Avoid all forms of caffeine 24 hours before your test or as directed by your health care provider. This includes coffee, tea (even decaffeinated tea), caffeinated sodas, chocolate, cocoa, and certain pain medicines.  Follow your health care provider's instructions regarding eating and drinking before the test.  Take your medicines as directed at regular times with water unless instructed otherwise. Exceptions may include:  If you have diabetes, ask how you are to take your insulin or pills. It is common to adjust insulin dosing the morning of the test.  If you are taking beta-blocker medicines, it is important to talk to your health care provider about these medicines well before the date of your test. Taking beta-blocker medicines may interfere with the test. In some cases, these medicines need to be changed or stopped 24 hours or more before the test.  If you wear a nitroglycerin patch, it may need to be removed prior to the test. Ask your health care provider if the patch should be removed before the test.  If you use an inhaler for any breathing condition, bring it with you to the test.  If you are an outpatient,  bring a snack so you can eat right after the stress phase of the test.  Do not smoke for 4 hours prior to the test or as directed by your health care provider.  Do not apply lotions, powders, creams, or oils on your chest prior to the test.  Wear loose-fitting clothes and comfortable shoes for the test. This test involves walking on a  treadmill. What happens during the procedure?  Multiple patches (electrodes) will be put on your chest. If needed, small areas of your chest may have to be shaved to get better contact with the electrodes. Once the electrodes are attached to your body, multiple wires will be attached to the electrodes and your heart rate will be monitored.  Your heart will be monitored both at rest and while exercising.  You will walk on a treadmill. The treadmill will be started at a slow pace. The treadmill speed and incline will gradually be increased to raise your heart rate. What happens after the procedure?  Your heart rate and blood pressure will be monitored after the test.  You may return to your normal schedule including diet, activities, and medicines, unless your health care provider tells you otherwise. This information is not intended to replace advice given to you by your health care provider. Make sure you discuss any questions you have with your health care provider. Document Released: 07/13/2000 Document Revised: 12/22/2015 Document Reviewed: 03/23/2013 Elsevier Interactive Patient Education  2017 [employer].

## 2016-11-15 ENCOUNTER — Telehealth: Payer: Self-pay | Admitting: *Deleted

## 2016-11-15 ENCOUNTER — Other Ambulatory Visit: Payer: 59

## 2016-11-15 DIAGNOSIS — R002 Palpitations: Secondary | ICD-10-CM

## 2016-11-15 NOTE — Telephone Encounter (Signed)
TSH order entered and linked to appt.

## 2016-11-15 NOTE — Telephone Encounter (Signed)
-----   Message from Nelva Bush, MD sent at 11/15/2016  4:13 PM EDT ----- Regarding: TSH Hi Anderson Malta,  Can you also check a TSH when [name] presents for her exercise tolerance test and lipid panel? Indication is palpitations. Thanks.  Gerald Stabs

## 2016-11-16 ENCOUNTER — Other Ambulatory Visit
Admission: RE | Admit: 2016-11-16 | Discharge: 2016-11-16 | Disposition: A | Discharge status: Home / Self Care | Payer: 59 | Source: Ambulatory Visit | Attending: Internal Medicine | Admitting: Internal Medicine

## 2016-11-16 ENCOUNTER — Ambulatory Visit (INDEPENDENT_AMBULATORY_CARE_PROVIDER_SITE_OTHER): Payer: 59

## 2016-11-16 DIAGNOSIS — Z13220 Encounter for screening for lipoid disorders: Secondary | ICD-10-CM

## 2016-11-16 DIAGNOSIS — R0789 Other chest pain: Secondary | ICD-10-CM

## 2016-11-16 DIAGNOSIS — R002 Palpitations: Secondary | ICD-10-CM

## 2016-11-16 LAB — LIPID PANEL
CHOLESTEROL: 156 mg/dL (ref 0–200)
HDL: 40 mg/dL — ABNORMAL LOW (ref >40)
LDL CALC: 101 mg/dL — AB (ref 0–99)
Total CHOL/HDL Ratio: 3.9 RATIO
Triglycerides: 73 mg/dL (ref <150)
VLDL: 15 mg/dL (ref 0–40)

## 2016-11-16 LAB — TSH: TSH: 1.955 u[IU]/mL (ref 0.350–4.500)

## 2016-11-20 LAB — EXERCISE TOLERANCE TEST
CHL CUP MPHR: 170 {beats}/min
CHL CUP RESTING HR STRESS: 105 {beats}/min
CSEPED: 7 min
CSEPEDS: 34 s
CSEPEW: 9.4 METS
Peak HR: 171 {beats}/min
Percent HR: 100 %

## 2016-11-21 ENCOUNTER — Telehealth: Payer: Self-pay | Admitting: Internal Medicine

## 2016-11-21 DIAGNOSIS — R079 Chest pain, unspecified: Secondary | ICD-10-CM

## 2016-11-21 DIAGNOSIS — R9439 Abnormal result of other cardiovascular function study: Secondary | ICD-10-CM

## 2016-11-21 MED ORDER — METOPROLOL TARTRATE 25 MG PO TABS: 12.5000 mg | ORAL_TABLET | Freq: Two times a day (BID) | ORAL | 5 refills | Status: DC

## 2016-11-21 NOTE — Telephone Encounter (Signed)
Spoke with patient. She is aware someone will be calling her to schedule the Coronary CT. Message sent to Mack Guise for scheduling.

## 2016-11-21 NOTE — Telephone Encounter (Signed)
I spoke with Jill Cook regarding the results of her exercise tolerance test, which were intermediate risk. She has not had any further episodes of chest pain. We have discussed further evaluation options and have agreed to a coronary CTA. I will start her on metoprolol tartrate 12.5 mg twice a day. No other medication changes at this time. She was advised to seek immediate medical attention if she has recurrent chest pain.  Nelva Bush, MD [facility] Pager: [phone]

## 2016-11-21 NOTE — Telephone Encounter (Signed)
I attempted to reach the patient to discuss results of exercise tolerance test, which was intermediate risk with subtle inferior ST depressions. I was unable to leave a voicemail, as her mailbox is currently full. We will attempt to contact her again later to review the results and discuss further evaluation.  Nelva Bush, MD [facility] Pager: [phone]

## 2016-11-21 NOTE — Telephone Encounter (Signed)
Spoke with patient. Gave her information with results of exercise tolerance test. She verbalized understanding of results and need for further testing. She is agreeable to either test that Dr End prefers. Routing to Dr End for his recommendation.

## 2016-11-22 ENCOUNTER — Ambulatory Visit (INDEPENDENT_AMBULATORY_CARE_PROVIDER_SITE_OTHER): Payer: 59 | Admitting: Family Medicine

## 2016-11-22 VITALS — BP 118/68 | HR 74 | Temp 98.0°F | Resp 14 | Ht 66.0 in | Wt 221.0 lb

## 2016-11-22 DIAGNOSIS — G459 Transient cerebral ischemic attack, unspecified: Secondary | ICD-10-CM

## 2016-11-22 MED ORDER — ATORVASTATIN CALCIUM 40 MG PO TABS: 40.0000 mg | ORAL_TABLET | Freq: Every day | ORAL | 3 refills | Status: DC

## 2016-11-22 MED ORDER — LISINOPRIL 10 MG PO TABS: 10.0000 mg | ORAL_TABLET | Freq: Every day | ORAL | 11 refills | Status: DC

## 2016-11-22 MED ORDER — BUPROPION HCL ER (XL) 300 MG PO TB24: 300.0000 mg | ORAL_TABLET | Freq: Every day | ORAL | 0 refills | Status: DC

## 2016-11-22 NOTE — Progress Notes (Signed)
Subjective:    Patient ID: Jill Cook, female    DOB: [birth year], 51 y.o.   MRN: [id number]  HPI I have not seen the patient since 2014. She is here today requesting a refill on her lisinopril as well as her Wellbutrin. As an aside, she mentions that she went to the emergency room with chest pain. CT angiogram of the chest was negative for PE. She was referred to a cardiologist who assessed perform a treadmill stress test. Apparently there was 1 mm of downsloping ST depression and she is scheduled for a coronary artery CT scan. She's been on aspirin and metoprolol ever since. I reviewed her recent lab work which included an LDL cholesterol slightly greater than 100. She then states that she believes she's had a TIA recently. She states that while she was at work, the left side of her face became numb. Her lips became numb. Her left arm became numb. Symptoms last approximately 30 minutes and resolve spontaneously. There was no slurred speech. There is no loss of consciousness. There was no headache. There was no muscle weakness symptoms occurred prior to her starting aspirin Past Medical History:  Diagnosis Date  . Anal fissure   . Anemia   . Anxiety   . Arthritis    RIGHT HIP AND WRIST  . Diverticulosis   . GERD (gastroesophageal reflux disease)    OTC PRN  . History of esophagitis   . Hypertension   . OA (osteoarthritis) of knee    RIGHT   Past Surgical History:  Procedure Laterality Date  . ANAL FISSURE REPAIR  06-13-2000  . BUNIONECTOMY Right 1991  . CESAREAN SECTION  1994  . 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 D & C HYSTEROSCOPY /  NOVASURE ENDOMETRIAL ABLATION  11-15-2005  . KNEE ARTHROSCOPY W/ MENISCECTOMY Right   . KNEE ARTHROSCOPY WITH MEDIAL MENISECTOMY Right 10/06/2013   Procedure: RIGHT KNEE ARTHROSCOPY WITH  CHONDROPLASTY;  Surgeon: Sydnee Cabal, MD;  Location: [facility];  Service: Orthopedics;  Laterality: Right;  . LAPAROSCOPY N/A 11/01/2015   Procedure: LAPAROSCOPY DIAGNOSTIC;  Surgeon: Molli Posey, MD;  Location: [facility];  Service: Gynecology;  Laterality: N/A;  . RIGHT KNEE PATELLOFEMORAL ARTHROPLASTY  06-10-2008  . SEPTOPLASTY  2000  . TONSILLECTOMY  AGE 79  . TRANSTHORACIC ECHOCARDIOGRAM  06-03-2008   NORMAL /  EF 60%  . TUBAL LIGATION  1996   Current Outpatient Prescriptions on File Prior to Visit  Medication Sig Dispense Refill  . aspirin EC 81 MG tablet Take 81 mg by mouth daily.    . Calcium Carb-Cholecalciferol (CALCIUM-VITAMIN D) 600-400 MG-UNIT TABS Take 1 tablet by mouth daily.    . cetirizine (ZYRTEC) 10 MG tablet Take 10 mg by mouth daily.    Marland Kitchen docusate sodium (COLACE) 100 MG capsule Take 1 capsule (100 mg total) by mouth 2 (two) times daily. 10 capsule 0  . metoprolol tartrate (LOPRESSOR) 25 MG tablet Take 0.5 tablets (12.5 mg total) by mouth 2 (two) times daily. 30 tablet 5  . polyethylene glycol (MIRALAX / GLYCOLAX) packet Take 17 g by mouth 2 (two) times daily. 14 each 0   No current facility-administered medications on file prior to visit.    Allergies  Allergen Reactions  . Codeine Nausea And Vomiting  . Hydrocodone Nausea Only  .  Hydromorphone Hcl Nausea And Vomiting  . Morphine Hives and Nausea And Vomiting   Social History   Social History  . Marital status: Married    Spouse name: N/A  . Number of children: 3  . Years of education: N/A   Occupational History  . RN     [employer]   Social History Main Topics  . Smoking status: Never Smoker  . Smokeless tobacco: Never Used  . Alcohol use Yes     Comment: 1 drink per month  . Drug use: No  . Sexual activity: Not on file   Other Topics Concern  . Not on file   Social History Narrative  . No narrative on file      Review of Systems   All other systems reviewed and are negative.      Objective:   Physical Exam  Constitutional: She is oriented to person, place, and time. She appears well-developed and well-nourished.  Cardiovascular: Normal rate, regular rhythm and normal heart sounds.  Exam reveals no gallop and no friction rub.   No murmur heard. Pulmonary/Chest: Effort normal and breath sounds normal. No respiratory distress. She has no wheezes. She has no rales. She exhibits no tenderness.  Abdominal: Soft. Bowel sounds are normal. She exhibits no distension. There is no tenderness. There is no rebound and no guarding.  Musculoskeletal: She exhibits no edema.  Neurological: She is alert and oriented to person, place, and time. She has normal reflexes. She displays normal reflexes. No cranial nerve deficit. She exhibits normal muscle tone. Coordination normal.  Vitals reviewed.         Assessment & Plan:  Transient cerebral ischemia, unspecified type - Plan: US Carotid Duplex Bilateral, ECHOCARDIOGRAM COMPLETE  If the patient has had a TIA, her goal LDL cholesterol is less than 70.  Begin Lipitor 40 mg poqhs.  BP is at goal.  Schedule carotid dopplers and echo to complete eval for TIA.

## 2016-11-28 NOTE — Progress Notes (Signed)
In anticipation of cardiac CTA, Dr. Johnsie Cancel has asked that Ms. Probert be started on metoprolol tartrate 25 mg BID to facilitate image quality and reduce radiation exposure. Can you share this with Ms. Terrero and send in the prescriptio for her? Thanks.  Nelva Bush, MD [facility] Pager: [phone]

## 2016-11-29 NOTE — Telephone Encounter (Addendum)
-----   Message -----  From: Josue Hector, MD  Sent: 11/28/2016  5:22 PM  To: Nelva Bush, MD, Eustace Moore- we will try to scan her on the old Siemens 128 please have her increase her lopressor to 25 bid  Ivin Booty go ahead and schedule for me  -----------------------------------------------------------------  No answer. Left message to call back to discuss above message.

## 2016-11-30 ENCOUNTER — Other Ambulatory Visit: Payer: Self-pay

## 2016-11-30 MED ORDER — METOPROLOL TARTRATE 25 MG PO TABS: 25.0000 mg | ORAL_TABLET | Freq: Two times a day (BID) | ORAL | 5 refills | Status: DC

## 2016-11-30 NOTE — Telephone Encounter (Signed)
Pt returning our call °Please call back ° °

## 2016-11-30 NOTE — Telephone Encounter (Signed)
S/w pt. She is agreeable to increase lopressor to 25mg  BID. New prescription sent to [place] She is awaiting a date for CT coronary.

## 2016-12-03 ENCOUNTER — Encounter: Payer: Self-pay | Admitting: Internal Medicine

## 2016-12-04 ENCOUNTER — Other Ambulatory Visit: Payer: 59

## 2016-12-04 ENCOUNTER — Ambulatory Visit
Admission: RE | Admit: 2016-12-04 | Discharge: 2016-12-04 | Disposition: A | Discharge status: Home / Self Care | Payer: 59 | Source: Ambulatory Visit | Attending: Family Medicine | Admitting: Family Medicine

## 2016-12-04 DIAGNOSIS — G459 Transient cerebral ischemic attack, unspecified: Secondary | ICD-10-CM | POA: Diagnosis not present

## 2016-12-05 ENCOUNTER — Ambulatory Visit (HOSPITAL_COMMUNITY): Payer: 59 | Attending: Family Medicine

## 2016-12-05 ENCOUNTER — Other Ambulatory Visit: Payer: Self-pay

## 2016-12-05 ENCOUNTER — Encounter: Payer: Self-pay | Admitting: Family Medicine

## 2016-12-05 DIAGNOSIS — G459 Transient cerebral ischemic attack, unspecified: Secondary | ICD-10-CM | POA: Insufficient documentation

## 2016-12-13 ENCOUNTER — Ambulatory Visit (HOSPITAL_COMMUNITY)
Admission: RE | Admit: 2016-12-13 | Discharge: 2016-12-13 | Disposition: A | Discharge status: Home / Self Care | Payer: 59 | Source: Ambulatory Visit | Attending: Internal Medicine | Admitting: Internal Medicine

## 2016-12-13 DIAGNOSIS — R079 Chest pain, unspecified: Secondary | ICD-10-CM | POA: Insufficient documentation

## 2016-12-13 DIAGNOSIS — R9439 Abnormal result of other cardiovascular function study: Secondary | ICD-10-CM | POA: Diagnosis present

## 2016-12-13 MED ORDER — NITROGLYCERIN 0.4 MG SL SUBL
0.8000 mg | SUBLINGUAL_TABLET | Freq: Once | SUBLINGUAL | Status: AC
  Administered 2016-12-13: 0.8 mg via SUBLINGUAL
  Filled 2016-12-13: qty 25

## 2016-12-13 MED ORDER — METOPROLOL TARTRATE 5 MG/5ML IV SOLN
5.0000 mg | INTRAVENOUS | Status: DC | PRN
  Administered 2016-12-13: 5 mg via INTRAVENOUS
  Filled 2016-12-13: qty 5

## 2016-12-13 MED ORDER — METOPROLOL TARTRATE 5 MG/5ML IV SOLN
INTRAVENOUS | Status: AC
  Filled 2016-12-13: qty 10

## 2016-12-13 MED ORDER — IOPAMIDOL (ISOVUE-370) INJECTION 76%
INTRAVENOUS | Status: AC
  Administered 2016-12-13: 100 mL via INTRAVENOUS
  Filled 2016-12-13: qty 100

## 2016-12-25 ENCOUNTER — Encounter: Payer: Self-pay | Admitting: Family Medicine

## 2016-12-25 ENCOUNTER — Ambulatory Visit (INDEPENDENT_AMBULATORY_CARE_PROVIDER_SITE_OTHER): Payer: 59 | Admitting: Family Medicine

## 2016-12-25 VITALS — BP 130/90 | HR 88 | Temp 99.0°F | Resp 16 | Ht 66.0 in | Wt 217.0 lb

## 2016-12-25 DIAGNOSIS — J029 Acute pharyngitis, unspecified: Secondary | ICD-10-CM

## 2016-12-25 LAB — STREP GROUP A AG, W/REFLEX TO CULT: STREGTOCOCCUS GROUP A AG SCREEN: NOT DETECTED

## 2016-12-25 MED ORDER — HYDROCODONE-HOMATROPINE 5-1.5 MG/5ML PO SYRP: 5.0000 mL | ORAL_SOLUTION | Freq: Three times a day (TID) | ORAL | 0 refills | Status: DC | PRN

## 2016-12-25 NOTE — Progress Notes (Signed)
Subjective:    Patient ID: Jill Cook, female    DOB: [birth year], 51 y.o.   MRN: [id number]  HPI Symptoms began Thursday with a scratchy throat. By Friday, the patient was having a difficult time swallowing her own saliva. She has had a low-grade fever all weekend. Sore throat is severe. Starting Saturday she had rhinorrhea and cough developed. She denies any sinus pain or otalgia. Past Medical History:  Diagnosis Date  . Anal fissure   . Anemia   . Anxiety   . Arthritis    RIGHT HIP AND WRIST  . Diverticulosis   . GERD (gastroesophageal reflux disease)    OTC PRN  . History of esophagitis   . Hypertension   . OA (osteoarthritis) of knee    RIGHT   Past Surgical History:  Procedure Laterality Date  . ANAL FISSURE REPAIR  06-13-2000  . BUNIONECTOMY Right 1991  . CESAREAN SECTION  1994  . 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 D & C HYSTEROSCOPY /  NOVASURE ENDOMETRIAL ABLATION  11-15-2005  . KNEE ARTHROSCOPY W/ MENISCECTOMY Right   . KNEE ARTHROSCOPY WITH MEDIAL MENISECTOMY Right 10/06/2013   Procedure: RIGHT KNEE ARTHROSCOPY WITH CHONDROPLASTY;  Surgeon: Sydnee Cabal, MD;  Location: [facility];  Service: Orthopedics;  Laterality: Right;  . LAPAROSCOPY N/A 11/01/2015   Procedure: LAPAROSCOPY DIAGNOSTIC;  Surgeon: Molli Posey, MD;  Location: [facility];  Service: Gynecology;  Laterality: N/A;  . RIGHT KNEE PATELLOFEMORAL ARTHROPLASTY  06-10-2008  . SEPTOPLASTY  2000  . TONSILLECTOMY  AGE 35  . TRANSTHORACIC ECHOCARDIOGRAM  06-03-2008   NORMAL /  EF 60%  . TUBAL LIGATION  1996   Current Outpatient Prescriptions on File Prior to Visit  Medication Sig Dispense Refill  . aspirin EC 81 MG tablet Take 81 mg by mouth daily.    Marland Kitchen BIOTIN PO Take by mouth.    Marland Kitchen buPROPion (WELLBUTRIN XL)  300 MG 24 hr tablet Take 1 tablet (300 mg total) by mouth daily. 30 tablet 0  . Calcium Carb-Cholecalciferol (CALCIUM-VITAMIN D) 600-400 MG-UNIT TABS Take 1 tablet by mouth daily.    . cetirizine (ZYRTEC) 10 MG tablet Take 10 mg by mouth daily.    Marland Kitchen docusate sodium (COLACE) 100 MG capsule Take 1 capsule (100 mg total) by mouth 2 (two) times daily. 10 capsule 0  . lisinopril (PRINIVIL,ZESTRIL) 10 MG tablet Take 1 tablet (10 mg total) by mouth daily. 30 tablet 11  . metoprolol tartrate (LOPRESSOR) 25 MG tablet Take 1 tablet (25 mg total) by mouth 2 (two) times daily. 60 tablet 5  . polyethylene glycol (MIRALAX / GLYCOLAX) packet Take 17 g by mouth 2 (two) times daily. 14 each 0   No current facility-administered medications on file prior to visit.    Allergies  Allergen Reactions  . Codeine Nausea And Vomiting  . Hydrocodone Nausea Only  . Hydromorphone Hcl Nausea And Vomiting  . Morphine Hives and Nausea And Vomiting   Social History   Social History  . Marital status: Married    Spouse name: N/A  . Number of children: 3  . Years of education: N/A   Occupational History  . RN     [employer]   Social History Main Topics  . Smoking status: Never Smoker  . Smokeless tobacco:  Never Used  . Alcohol use Yes     Comment: 1 drink per month  . Drug use: No  . Sexual activity: Not on file   Other Topics Concern  . Not on file   Social History Narrative  . No narrative on file      Review of Systems  All other systems reviewed and are negative.      Objective:   Physical Exam  HENT:  Right Ear: External ear normal.  Left Ear: External ear normal.  Nose: Nose normal.  Mouth/Throat: Posterior oropharyngeal erythema present. No oropharyngeal exudate or posterior oropharyngeal edema.  Neck: Neck supple.  Cardiovascular: Normal rate, regular rhythm and normal heart sounds.   No murmur heard. Pulmonary/Chest: Effort normal and breath sounds normal. No  respiratory distress. She has no wheezes. She has no rales.  Abdominal: Soft. Bowel sounds are normal. She exhibits no distension. There is no tenderness. There is no rebound.  Lymphadenopathy:    She has cervical adenopathy.  Vitals reviewed.         Assessment & Plan:  Sore throat - Plan: STREP GROUP A AG, W/REFLEX TO CULT  Strep test is negative. Symptoms are consistent with a viral upper respiratory infection. Treat symptoms with Hycodan 1 teaspoon every 6 hours as needed for cough. Recommended she refrain from working for the next 48 hours to prevent spread of infection. Patient is a nurse at the hospital. Recheck if no better in 48 hours or sooner if worsening

## 2016-12-26 ENCOUNTER — Ambulatory Visit: Payer: 59 | Admitting: Podiatry

## 2016-12-27 LAB — CULTURE, GROUP A STREP

## 2016-12-28 ENCOUNTER — Telehealth: Payer: 59 | Admitting: Family

## 2016-12-28 DIAGNOSIS — B9689 Other specified bacterial agents as the cause of diseases classified elsewhere: Secondary | ICD-10-CM | POA: Diagnosis not present

## 2016-12-28 DIAGNOSIS — J329 Chronic sinusitis, unspecified: Secondary | ICD-10-CM

## 2016-12-28 MED ORDER — BENZONATATE 100 MG PO CAPS: 100.0000 mg | ORAL_CAPSULE | Freq: Three times a day (TID) | ORAL | 0 refills | Status: DC | PRN

## 2016-12-28 MED ORDER — PREDNISONE 5 MG PO TABS: 5.0000 mg | ORAL_TABLET | ORAL | 0 refills | Status: DC

## 2016-12-28 MED ORDER — LEVOFLOXACIN 500 MG PO TABS: 500.0000 mg | ORAL_TABLET | Freq: Every day | ORAL | 0 refills | Status: DC

## 2016-12-28 NOTE — Progress Notes (Signed)
Thank you for the details you put in the comment boxes. Those details really help [place] take better care of you. See comments below.   We are sorry that you are not feeling well.  Here is how we plan to help!  Based on what you have shared with me it looks like you have sinusitis.  Sinusitis is inflammation and infection in the sinus cavities of the head.  Based on your presentation I believe you most likely have Acute Bacterial Sinusitis.  This is an infection caused by bacteria and is treated with antibiotics. I have prescribed Levofloxicin 500mg  by mouth once daily for 7 days. You may use an oral decongestant such as Mucinex D or if you have glaucoma or high blood pressure use plain Mucinex. Saline nasal spray help and can safely be used as often as needed for congestion.  If you develop worsening sinus pain, fever or notice severe headache and vision changes, or if symptoms are not better after completion of antibiotic, please schedule an appointment with a health care provider.    I'm also sending tessalon perles 100mg , take 1-2 every 8 hours as needed for cough. Additionally, I will send a prednisone dose pack due to the severity of your symptoms.   Sinus infections are not as easily transmitted as other respiratory infection, however we still recommend that you avoid close contact with loved ones, especially the very young and elderly.  Remember to wash your hands thoroughly throughout the day as this is the number one way to prevent the spread of infection!  Home Care:  Only take medications as instructed by your medical team.  Complete the entire course of an antibiotic.  Do not take these medications with alcohol.  A steam or ultrasonic humidifier can help congestion.  You can place a towel over your head and breathe in the steam from hot water coming from a faucet.  Avoid close contacts especially the very young and the elderly.  Cover your mouth when you cough or sneeze.  Always  remember to wash your hands.  Get Help Right Away If:  You develop worsening fever or sinus pain.  You develop a severe head ache or visual changes.  Your symptoms persist after you have completed your treatment plan.  Make sure you  Understand these instructions.  Will watch your condition.  Will get help right away if you are not doing well or get worse.  Your e-visit answers were reviewed by a board certified advanced clinical practitioner to complete your personal care plan.  Depending on the condition, your plan could have included both over the counter or prescription medications.  If there is a problem please reply  once you have received a response from your provider.  Your safety is important to [place].  If you have drug allergies check your prescription carefully.    You can use MyChart to ask questions about today's visit, request a non-urgent call back, or ask for a work or school excuse for 24 hours related to this e-Visit. If it has been greater than 24 hours you will need to follow up with your provider, or enter a new e-Visit to address those concerns.  You will get an e-mail in the next two days asking about your experience.  I hope that your e-visit has been valuable and will speed your recovery. Thank you for using e-visits.

## 2017-01-02 ENCOUNTER — Ambulatory Visit: Payer: 59 | Admitting: Internal Medicine

## 2017-01-04 ENCOUNTER — Other Ambulatory Visit: Payer: Self-pay | Admitting: Family Medicine

## 2017-02-06 ENCOUNTER — Other Ambulatory Visit: Payer: Self-pay | Admitting: Family Medicine

## 2017-02-14 ENCOUNTER — Telehealth: Payer: 59 | Admitting: Family

## 2017-02-14 DIAGNOSIS — N760 Acute vaginitis: Secondary | ICD-10-CM

## 2017-02-14 DIAGNOSIS — B9689 Other specified bacterial agents as the cause of diseases classified elsewhere: Secondary | ICD-10-CM | POA: Diagnosis not present

## 2017-02-14 MED ORDER — METRONIDAZOLE 500 MG PO TABS: 500.0000 mg | ORAL_TABLET | Freq: Two times a day (BID) | ORAL | 0 refills | Status: DC

## 2017-02-14 NOTE — Progress Notes (Signed)
Thank you for the details you put in the comment boxes. Those details really help [place] take better care of you. I called you for more details and left a message.   We are sorry that you are not feeling well. Here is how we plan to help! Based on what you shared with me it looks like you: May have a vaginosis due to bacteria  Vaginosis is an inflammation of the vagina that can result in discharge, itching and pain. The cause is usually a change in the normal balance of vaginal bacteria or an infection. Vaginosis can also result from reduced estrogen levels after menopause.  The most common causes of vaginosis are:   Bacterial vaginosis which results from an overgrowth of one on several organisms that are normally present in your vagina.   Yeast infections which are caused by a naturally occurring fungus called candida.   Vaginal atrophy (atrophic vaginosis) which results from the thinning of the vagina from reduced estrogen levels after menopause.   Trichomoniasis which is caused by a parasite and is commonly transmitted by sexual intercourse.  Factors that increase your risk of developing vaginosis include: Marland Kitchen Medications, such as antibiotics and steroids . Uncontrolled diabetes . Use of hygiene products such as bubble bath, vaginal spray or vaginal deodorant . Douching . Wearing damp or tight-fitting clothing . Using an intrauterine device (IUD) for birth control . Hormonal changes, such as those associated with pregnancy, birth control pills or menopause . Sexual activity . Having a sexually transmitted infection  Your treatment plan is Metronidazole or Flagyl 500mg  twice a day for 7 days.  I have electronically sent this prescription into the pharmacy that you have chosen.  Be sure to take all of the medication as directed. Stop taking any medication if you develop a rash, tongue swelling or shortness of breath. Mothers who are breast feeding should consider pumping and discarding their  breast milk while on these antibiotics. However, there is no consensus that infant exposure at these doses would be harmful.  Remember that medication creams can weaken latex condoms. Marland Kitchen   HOME CARE:  Good hygiene may prevent some types of vaginosis from recurring and may relieve some symptoms:  . Avoid baths, hot tubs and whirlpool spas. Rinse soap from your outer genital area after a shower, and dry the area well to prevent irritation. Don't use scented or harsh soaps, such as those with deodorant or antibacterial action. Marland Kitchen Avoid irritants. These include scented tampons and pads. . Wipe from front to back after using the toilet. Doing so avoids spreading fecal bacteria to your vagina.  Other things that may help prevent vaginosis include:  Marland Kitchen Don't douche. Your vagina doesn't require cleansing other than normal bathing. Repetitive douching disrupts the normal organisms that reside in the vagina and can actually increase your risk of vaginal infection. Douching won't clear up a vaginal infection. . Use a latex condom. Both female and female latex condoms may help you avoid infections spread by sexual contact. . Wear cotton underwear. Also wear pantyhose with a cotton crotch. If you feel comfortable without it, skip wearing underwear to bed. Yeast thrives in [employer] Your symptoms should improve in the next day or two.  GET HELP RIGHT AWAY IF:  . You have pain in your lower abdomen ( pelvic area or over your ovaries) . You develop nausea or vomiting . You develop a fever . Your discharge changes or worsens . You have persistent pain with intercourse .  You develop shortness of breath, a rapid pulse, or you faint.  These symptoms could be signs of problems or infections that need to be evaluated by a medical provider now.  MAKE SURE YOU    Understand these instructions.  Will watch your condition.  Will get help right away if you are not doing well or get worse.  Your  e-visit answers were reviewed by a board certified advanced clinical practitioner to complete your personal care plan. Depending upon the condition, your plan could have included both over the counter or prescription medications. Please review your pharmacy choice to make sure that you have choses a pharmacy that is open for you to pick up any needed prescription, Your safety is important to [place]. If you have drug allergies check your prescription carefully.   You can use MyChart to ask questions about today's visit, request a non-urgent call back, or ask for a work or school excuse for 24 hours related to this e-Visit. If it has been greater than 24 hours you will need to follow up with your provider, or enter a new e-Visit to address those concerns. You will get a MyChart message within the next two days asking about your experience. I hope that your e-visit has been valuable and will speed your recovery.

## 2017-03-13 ENCOUNTER — Other Ambulatory Visit: Payer: Self-pay | Admitting: Family Medicine

## 2017-04-22 ENCOUNTER — Other Ambulatory Visit: Payer: Self-pay | Admitting: Family Medicine

## 2017-06-03 ENCOUNTER — Emergency Department
Admission: EM | Admit: 2017-06-03 | Discharge: 2017-06-03 | Disposition: A | Discharge status: Home / Self Care | Payer: 59 | Attending: Emergency Medicine | Admitting: Emergency Medicine

## 2017-06-03 ENCOUNTER — Emergency Department: Payer: 59

## 2017-06-03 DIAGNOSIS — Z79899 Other long term (current) drug therapy: Secondary | ICD-10-CM | POA: Insufficient documentation

## 2017-06-03 DIAGNOSIS — F329 Major depressive disorder, single episode, unspecified: Secondary | ICD-10-CM | POA: Insufficient documentation

## 2017-06-03 DIAGNOSIS — I10 Essential (primary) hypertension: Secondary | ICD-10-CM | POA: Diagnosis not present

## 2017-06-03 DIAGNOSIS — M7989 Other specified soft tissue disorders: Secondary | ICD-10-CM | POA: Diagnosis not present

## 2017-06-03 DIAGNOSIS — L02612 Cutaneous abscess of left foot: Secondary | ICD-10-CM | POA: Insufficient documentation

## 2017-06-03 DIAGNOSIS — L03116 Cellulitis of left lower limb: Secondary | ICD-10-CM | POA: Diagnosis not present

## 2017-06-03 DIAGNOSIS — L02416 Cutaneous abscess of left lower limb: Secondary | ICD-10-CM | POA: Diagnosis not present

## 2017-06-03 DIAGNOSIS — L0291 Cutaneous abscess, unspecified: Secondary | ICD-10-CM

## 2017-06-03 DIAGNOSIS — F419 Anxiety disorder, unspecified: Secondary | ICD-10-CM | POA: Diagnosis not present

## 2017-06-03 DIAGNOSIS — Z7982 Long term (current) use of aspirin: Secondary | ICD-10-CM | POA: Insufficient documentation

## 2017-06-03 DIAGNOSIS — M79672 Pain in left foot: Secondary | ICD-10-CM | POA: Diagnosis not present

## 2017-06-03 LAB — CBC WITH DIFFERENTIAL/PLATELET
Basophils Absolute: 0.1 10*3/uL (ref 0–0.1)
Basophils Relative: 1 %
Eosinophils Absolute: 0.1 10*3/uL (ref 0–0.7)
Eosinophils Relative: 2 %
HCT: 37.3 % (ref 35.0–47.0)
HEMOGLOBIN: 12.6 g/dL (ref 12.0–16.0)
LYMPHS ABS: 1.2 10*3/uL (ref 1.0–3.6)
LYMPHS PCT: 19 %
MCH: 28.9 pg (ref 26.0–34.0)
MCHC: 33.9 g/dL (ref 32.0–36.0)
MCV: 85.1 fL (ref 80.0–100.0)
MONOS PCT: 7 %
Monocytes Absolute: 0.5 10*3/uL (ref 0.2–0.9)
NEUTROS PCT: 71 %
Neutro Abs: 4.6 10*3/uL (ref 1.4–6.5)
PLATELETS: 178 10*3/uL (ref 150–440)
RBC: 4.38 MIL/uL (ref 3.80–5.20)
RDW: 13.9 % (ref 11.5–14.5)
WBC: 6.5 10*3/uL (ref 3.6–11.0)

## 2017-06-03 LAB — COMPREHENSIVE METABOLIC PANEL
ALT: 17 U/L (ref 14–54)
ANION GAP: 8 (ref 5–15)
AST: 23 U/L (ref 15–41)
Albumin: 4.1 g/dL (ref 3.5–5.0)
Alkaline Phosphatase: 59 U/L (ref 38–126)
BUN: 17 mg/dL (ref 6–20)
CHLORIDE: 105 mmol/L (ref 101–111)
CO2: 26 mmol/L (ref 22–32)
Calcium: 9.2 mg/dL (ref 8.9–10.3)
Creatinine, Ser: 1.05 mg/dL — ABNORMAL HIGH (ref 0.44–1.00)
Glucose, Bld: 120 mg/dL — ABNORMAL HIGH (ref 65–99)
Potassium: 3.8 mmol/L (ref 3.5–5.1)
SODIUM: 139 mmol/L (ref 135–145)
TOTAL PROTEIN: 7 g/dL (ref 6.5–8.1)
Total Bilirubin: 0.7 mg/dL (ref 0.3–1.2)

## 2017-06-03 MED ORDER — CLINDAMYCIN PHOSPHATE 600 MG/50ML IV SOLN
600.0000 mg | Freq: Once | INTRAVENOUS | Status: AC
  Administered 2017-06-03: 600 mg via INTRAVENOUS
  Filled 2017-06-03: qty 50

## 2017-06-03 MED ORDER — CLINDAMYCIN HCL 300 MG PO CAPS: 300.0000 mg | ORAL_CAPSULE | Freq: Three times a day (TID) | ORAL | 0 refills | Status: AC

## 2017-06-03 MED ORDER — OXYCODONE-ACETAMINOPHEN 5-325 MG PO TABS
2.0000 | ORAL_TABLET | Freq: Once | ORAL | Status: AC
  Administered 2017-06-03: 2 via ORAL
  Filled 2017-06-03: qty 2

## 2017-06-03 MED ORDER — HYDROMORPHONE HCL 1 MG/ML IJ SOLN
0.5000 mg | Freq: Once | INTRAMUSCULAR | Status: AC
  Administered 2017-06-03: 0.5 mg via INTRAVENOUS
  Filled 2017-06-03: qty 1

## 2017-06-03 MED ORDER — OXYCODONE-ACETAMINOPHEN 5-325 MG PO TABS: 1.0000 | ORAL_TABLET | Freq: Four times a day (QID) | ORAL | 0 refills | Status: DC | PRN

## 2017-06-03 MED ORDER — ONDANSETRON HCL 4 MG/2ML IJ SOLN
4.0000 mg | Freq: Once | INTRAMUSCULAR | Status: AC
  Administered 2017-06-03: 4 mg via INTRAVENOUS
  Filled 2017-06-03: qty 2

## 2017-06-03 NOTE — ED Notes (Signed)
Patient transported to Ultrasound 

## 2017-06-03 NOTE — Discharge Instructions (Signed)
Please follow up with your podiatrist and your primary care physician

## 2017-06-03 NOTE — ED Notes (Addendum)
Pt to the ER for pain to the left foot r/t a cyst. Pt says she felt swelling to the left foot yesterday. Pt has visible swelling to the lateral side of the left foot with inflammation present. Area is hot to touch and very tender. Pt has seen a podiatrist but no plan for surgery has been noted.

## 2017-06-03 NOTE — ED Triage Notes (Signed)
Patient c/o left foot pain beginning 2 days ago. Patient reports hx of cyst to foot, being treated by podiatry. Patient's heel reddened, and hot to touch. Patient not able to ambulate.

## 2017-06-03 NOTE — ED Notes (Signed)
Pt back from US

## 2017-06-03 NOTE — ED Provider Notes (Signed)
[facility] Emergency Department Provider Note   ____________________________________________   First MD Initiated Contact with Patient 06/03/17 [phone]     (approximate)  I have reviewed the triage vital signs and the nursing notes.   HISTORY  Chief Complaint Cellulitis    HPI Jill Cook is a 51 y.o. female Who comes into the hospital today with some left-sided foot pain. The patient has a cyst on the heel of her foot that has been there for some time. She reports though that she seen at the podiatrist's office for some other foot problems. She reports that on Saturday she felt like this this had gotten bigger. She started having some difficulty walking and reports that today the pain was even worse. She has been using orthotics because of an extra bony growth she has on her foot. The patient reports that the cyst has never been addressed by her podiatrist. She reports that tonight she's been unable to walk. She denies any fevers and rates her pain a 9 out of 10 in intensity. The patient reports that she has been taking ibuprofen at home but it has not been helping the pain. The patient is here today for evaluation of her symptoms.   Past Medical History:  Diagnosis Date  . Anal fissure   . Anemia   . Anxiety   . Arthritis    RIGHT HIP AND WRIST  . Diverticulosis   . GERD (gastroesophageal reflux disease)    OTC PRN  . History of esophagitis   . Hypertension   . OA (osteoarthritis) of knee    RIGHT    Patient Active Problem List   Diagnosis Date Noted  . Obese 08/23/2015  . 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 Edema 08/17/2015  . Generalized anxiety disorder 08/17/2015  . Depression 08/17/2015  . RLQ abdominal pain 03/29/2015  . S/P right knee arthroscopy 10/06/2013  . SPLENOMEGALY 10/15/2008  . ESOPHAGITIS 10/14/2008  . NAUSEA 10/14/2008  . DYSPEPSIA 04/14/2008  . RECTAL FISSURE 04/14/2008  . Abdominal pain, left  upper quadrant 04/14/2008  . GERD 04/12/2008  . DUODENITIS 04/12/2008  . IRRITABLE BOWEL SYNDROME 04/12/2008  . ANEMIA, IRON DEFICIENCY, HX OF 04/12/2008  . ESOPHAGITIS, HX OF 04/12/2008    Past Surgical History:  Procedure Laterality Date  . ANAL FISSURE REPAIR  06-13-2000  . BUNIONECTOMY Right 1991  . CESAREAN SECTION  1994  . D & C HYSTEROSCOPY /  NOVASURE ENDOMETRIAL ABLATION  11-15-2005  . KNEE ARTHROSCOPY W/ MENISCECTOMY Right   . RIGHT KNEE PATELLOFEMORAL ARTHROPLASTY  06-10-2008  . SEPTOPLASTY  2000  . TONSILLECTOMY  AGE 23  . TRANSTHORACIC ECHOCARDIOGRAM  06-03-2008   NORMAL /  EF 60%  . TUBAL LIGATION  1996    Prior to Admission medications   Medication Sig Start Date End Date Taking? Authorizing Provider  aspirin EC 81 MG tablet Take 81 mg by mouth daily.   Yes [provider]  BIOTIN PO Take by mouth.   Yes [provider]  buPROPion (WELLBUTRIN XL) 300 MG 24 hr tablet TAKE 1 TABLET BY MOUTH DAILY. 04/22/17  Yes Susy Frizzle, MD  Calcium Carb-Cholecalciferol (CALCIUM-VITAMIN D) 600-400 MG-UNIT TABS Take 1 tablet by mouth daily.   Yes [provider]  cetirizine (ZYRTEC) 10 MG tablet Take 10 mg by mouth daily.   Yes [provider]  lisinopril (PRINIVIL,ZESTRIL) 10 MG tablet Take 1 tablet (10 mg total) by mouth daily. 11/22/16  Yes Susy Frizzle, MD  benzonatate (TESSALON PERLES) 100 MG capsule Take 1-2 capsules (100-200 mg total) by mouth every 8 (eight) hours as needed for cough. Patient not taking: Reported on 06/03/2017 12/28/16   Withrow, Elyse Jarvis, FNP  clindamycin (CLEOCIN) 300 MG capsule Take 1 capsule (300 mg total) 3 (three) times daily for 10 days by mouth. 06/03/17 06/13/17  Loney Hering, MD  docusate sodium (COLACE) 100 MG capsule Take 1 capsule (100 mg total) by mouth 2 (two) times daily. Patient not taking: Reported on 06/03/2017 08/23/15   Danae Orleans, PA-C  HYDROcodone-homatropine [facility]) 5-1.5 MG/5ML syrup  Take 5 mLs by mouth every 8 (eight) hours as needed for cough. Patient not taking: Reported on 06/03/2017 12/25/16   Susy Frizzle, MD  levofloxacin (LEVAQUIN) 500 MG tablet Take 1 tablet (500 mg total) by mouth daily. Patient not taking: Reported on 06/03/2017 12/28/16   Benjamine Mola, FNP  metoprolol tartrate (LOPRESSOR) 25 MG tablet Take 1 tablet (25 mg total) by mouth 2 (two) times daily. Patient not taking: Reported on 06/03/2017 11/30/16   End, Harrell Gave, MD  metroNIDAZOLE (FLAGYL) 500 MG tablet Take 1 tablet (500 mg total) by mouth 2 (two) times daily. Patient not taking: Reported on 06/03/2017 02/14/17   Benjamine Mola, FNP  oxyCODONE-acetaminophen (ROXICET) 5-325 MG tablet Take 1 tablet every 6 (six) hours as needed by mouth. 06/03/17   Loney Hering, MD  polyethylene glycol [facility] Floria Raveling) packet Take 17 g by mouth 2 (two) times daily. Patient not taking: Reported on 06/03/2017 08/23/15   Danae Orleans, PA-C  predniSONE (DELTASONE) 5 MG tablet Take 1 tablet (5 mg total) by mouth as directed. Sterapred 21 dose taper Patient not taking: Reported on 06/03/2017 12/28/16   Benjamine Mola, FNP    Allergies Codeine; Hydrocodone; Hydromorphone hcl; and Morphine  Family History  Problem Relation Age of Onset  . Hypertension Mother   . Thyroid disease Mother   . Atrial fibrillation Mother   . Stroke Mother   . Heart disease Father   . Heart attack Father 36  . Hypertension Paternal Grandfather   . Diabetes Paternal Grandfather   . Uterine cancer Paternal Aunt   . Colon cancer Maternal Aunt        dx in her 103's  . Esophageal cancer Maternal Grandfather     Social History Social History   Tobacco Use  . Smoking status: Never Smoker  . Smokeless tobacco: Never Used  Substance Use Topics  . Alcohol use: Yes    Comment: 1 drink per month  . Drug use: No    Review of Systems  Constitutional: No fever/chills Eyes: No visual changes. ENT: No sore  throat. Cardiovascular: Denies chest pain. Respiratory: Denies shortness of breath. Gastrointestinal: No abdominal pain.  No nausea, no vomiting.  No diarrhea.  No constipation. Genitourinary: Negative for dysuria. Musculoskeletal: left foot pain Skin: Negative for rash. Neurological: Negative for headaches, focal weakness or numbness.   ____________________________________________   PHYSICAL EXAM:  VITAL SIGNS: ED Triage Vitals  Enc Vitals Group     BP 06/03/17 0506 (!) 147/90     Pulse Rate 06/03/17 0506 (!) 103     Resp 06/03/17 0506 (!) 22     Temp 06/03/17 0506 98.7 F (37.1 C)     Temp Source 06/03/17 0506 Oral     SpO2 06/03/17 0506 96 %     Weight 06/03/17 0507 217 lb (98.4 kg)  Height 06/03/17 0507 5\' 6"  (1.676 m)     Head Circumference --      Peak Flow --      Pain Score 06/03/17 0506 9     Pain Loc --      Pain Edu? --      Excl. in [place]? --     Constitutional: Alert and oriented. Well appearing and in moderate distress. Eyes: Conjunctivae are normal. PERRL. EOMI. Head: Atraumatic. Nose: No congestion/rhinnorhea. Mouth/Throat: Mucous membranes are moist.  Oropharynx non-erythematous. Cardiovascular: Normal rate, regular rhythm. Grossly normal heart sounds.  Good peripheral circulation. Respiratory: Normal respiratory effort.  No retractions. Lungs CTAB. Gastrointestinal: Soft and nontender. No distention. positive bowel sounds Musculoskeletal: No lower extremity tenderness nor edema.   Neurologic:  Normal speech and language.  Skin:  Skin is warm, dry and intact. erythema and tenderness to the lateral left heel with no pointing or fluctuant abscess. Psychiatric: Mood and affect are normal.   ____________________________________________   LABS (all labs ordered are listed, but only abnormal results are displayed)  Labs Reviewed  COMPREHENSIVE METABOLIC PANEL - Abnormal; Notable for the following components:      Result Value   Glucose, Bld 120 (*)     Creatinine, Ser 1.05 (*)    All other components within normal limits  CULTURE, BLOOD (ROUTINE X 2)  CULTURE, BLOOD (ROUTINE X 2)  CBC WITH DIFFERENTIAL/PLATELET   ____________________________________________  EKG  none ____________________________________________  RADIOLOGY  [place] Lt Lower Extrem Ltd Soft Tissue Non Vascular  Result Date: 06/03/2017 CLINICAL DATA:  Left lateral foot pain, swelling, and redness. History of cyst. EXAM: ULTRASOUND left LOWER EXTREMITY LIMITED TECHNIQUE: Ultrasound examination of the lower extremity soft tissues was performed in the area of clinical concern. COMPARISON:  None. FINDINGS: Joint Space: Joint space not imaged. Muscles: Not imaged. Tendons: Not imaged. Other Soft Tissue Structures: Focused ultrasound examination of the left lateral heel in the area of symptoms. Imaging of this area demonstrates hyperemia of tissues on color flow Doppler imaging. No focal mass lesion or cyst identified. IMPRESSION: Hyperemia of soft tissues over the left heel suggesting cellulitis or inflammatory process. No cyst or mass identified. Electronically Signed   By: Lucienne Capers M.D.   On: 06/03/2017 06:22    ____________________________________________   PROCEDURES  Procedure(s) performed: None  Procedures  Critical Care performed: No  ____________________________________________   INITIAL IMPRESSION / ASSESSMENT AND PLAN / ED COURSE  As part of my medical decision making, I reviewed the following data within the electronic MEDICAL RECORD NUMBER Notes from prior ED visits and [place] Controlled Substance Database   this is a 51 year old female who comes into the hospital today with some left-sided foot pain. She has some pain and swelling to the heel of her left foot.  my differential diagnosis includes bone spurs, cyst, abscess, cellulitis.  I will send the patient for an ultrasound to evaluate for an abscess. The patient will receive a dose of clindamycin  giving how hot and red area is. I will await the results of the patient's blood work and imaging studies.     The patient's blood work is unremarkable. She did receive an ultrasound which did not show any cyst or abscess. the patient's ultrasound showed hyperemia of the soft tissues suggesting cellulitis. The patient did receive a dose of clindamycin as well as a second dose of Dilaudid and Percocet. She'll be discharged home to follow-up with her podiatrist. She should return with any worsening conditions, spreading  cellulitis fevers or any other concerns. ____________________________________________   FINAL CLINICAL IMPRESSION(S) / ED DIAGNOSES  Final diagnoses:  Abscess  Cellulitis of left lower extremity      Note:  This document was prepared using Dragon voice recognition software and may include unintentional dictation errors.    Loney Hering, MD 06/03/17 [phone]

## 2017-06-08 LAB — CULTURE, BLOOD (ROUTINE X 2)
Culture: NO GROWTH
Culture: NO GROWTH
SPECIAL REQUESTS: ADEQUATE

## 2017-06-10 ENCOUNTER — Ambulatory Visit: Payer: 59 | Admitting: Podiatry

## 2017-06-10 ENCOUNTER — Ambulatory Visit (INDEPENDENT_AMBULATORY_CARE_PROVIDER_SITE_OTHER): Payer: 59

## 2017-06-10 ENCOUNTER — Encounter: Payer: Self-pay | Admitting: Podiatry

## 2017-06-10 DIAGNOSIS — M109 Gout, unspecified: Secondary | ICD-10-CM | POA: Diagnosis not present

## 2017-06-10 MED ORDER — INDOMETHACIN 50 MG PO CAPS: 50.0000 mg | ORAL_CAPSULE | Freq: Two times a day (BID) | ORAL | 1 refills | Status: DC

## 2017-06-10 NOTE — Progress Notes (Signed)
She went to the emergency room over the weekend with a chief complaint of a red hot swollen painful foot that originated while she was sleeping early in the morning. She denies any trauma to the foot in the ER doctor performed multiple procedures particularly blood work and an ultrasound to evaluate the area which was negative. She denies a history of gout.  Objective: Vital signs are stable alert and oriented 3. Pulses are palpable. At this point she states that the pain level was a 10 initially but has dropped down to about a 3 and has been taking clindamycin. She states that the cellulitis never ascended proximally it was just around the base of the left heel laterally and mildly erythematous out about the fifth metatarsal base.  Radiographs taken today demonstrate calcification within the plantar fascia which has been present for many years. The area is warm to the touch and tender on direct palpation. There is no open wounds or lesions nothing to indicate cellulitis.  Assessment probable gout attack left heel. Resolving for because of time.  Plan: Placed her on indomethacin and dispensed a different Darco shoe. I will follow-up with her in a couple weeks to make sure this is resolved.

## 2017-06-17 ENCOUNTER — Ambulatory Visit: Payer: 59 | Admitting: Podiatry

## 2017-06-28 IMAGING — US US CAROTID DUPLEX BILAT
1 series · 13 of 24 positions shown · non-contrast
Comparison: None.

CLINICAL DATA: TIA.

EXAM:
BILATERAL CAROTID DUPLEX ULTRASOUND
TECHNIQUE: Gray scale imaging, color Doppler and duplex ultrasound were
performed of bilateral carotid and vertebral arteries in the neck.

[Series 1: us carotid duplex bilat · 0.06mm/px · 13 of 68 slices shown]
[im 1/68]
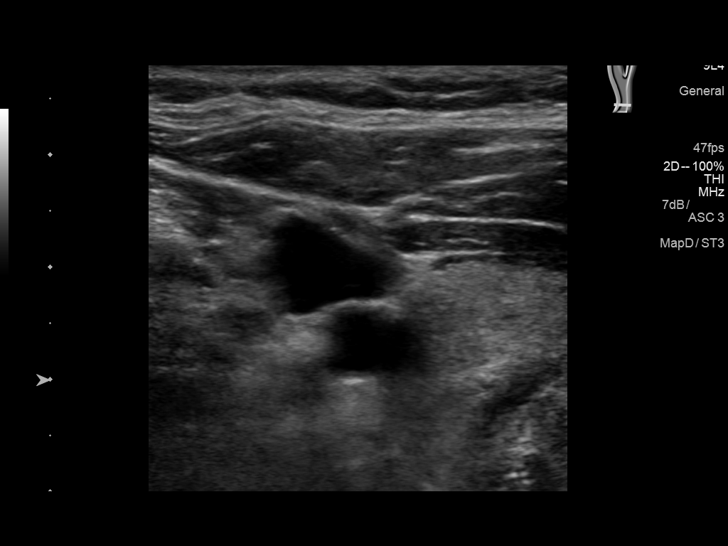
[im 6/68]
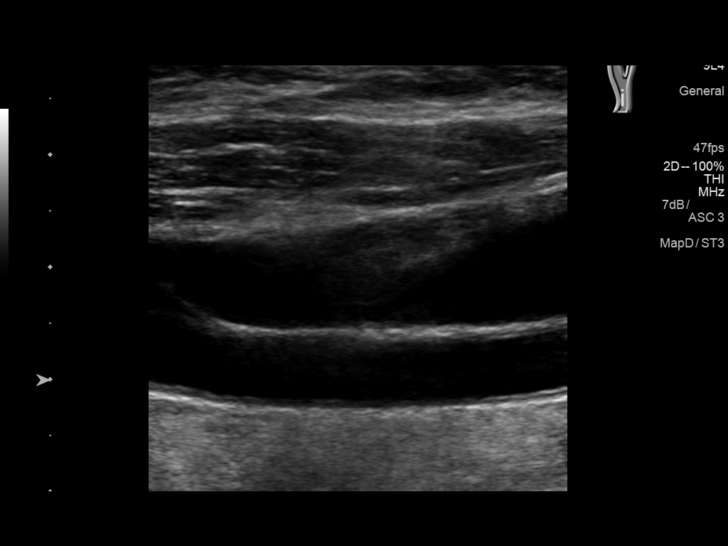
[im 12/68]
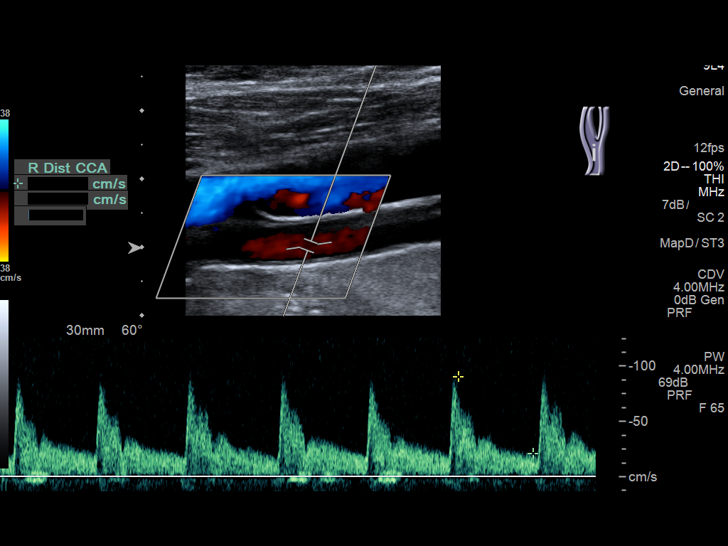
[im 18/68]
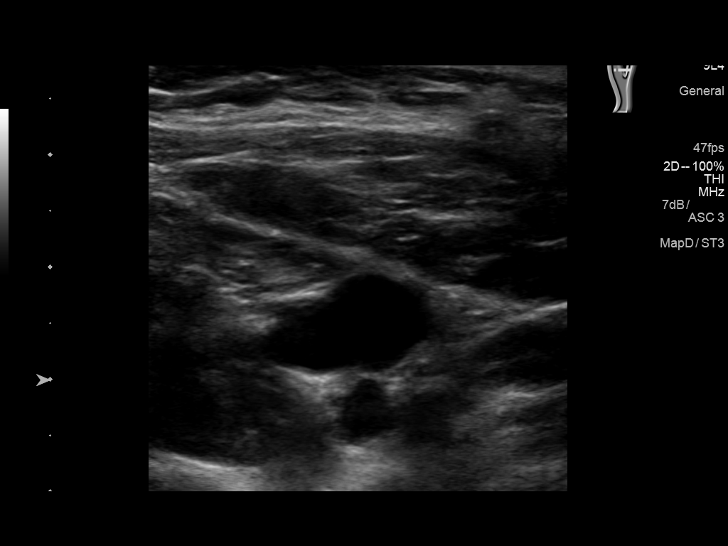
[im 24/68]
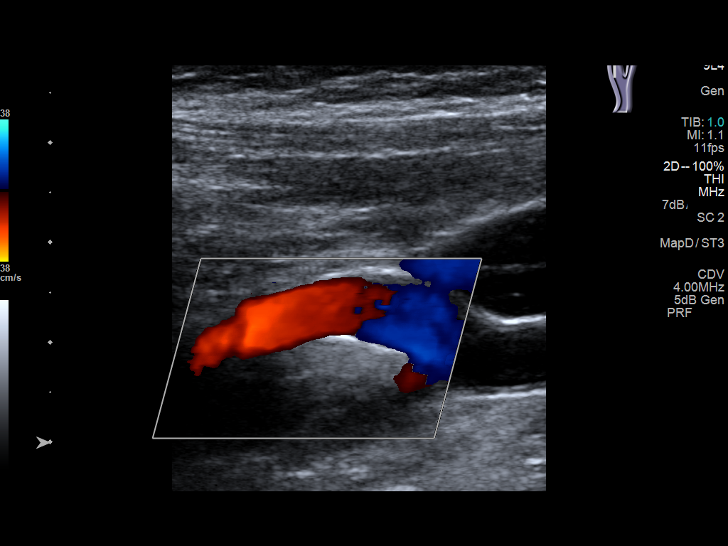
[im 30/68]
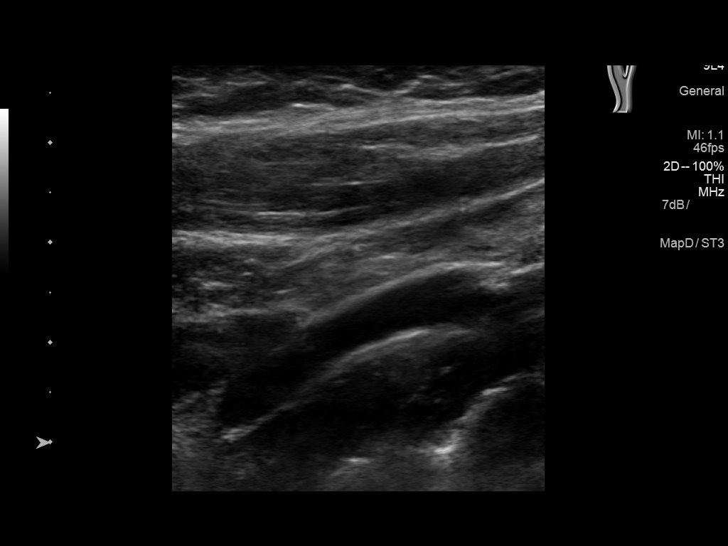
[im 35/68]
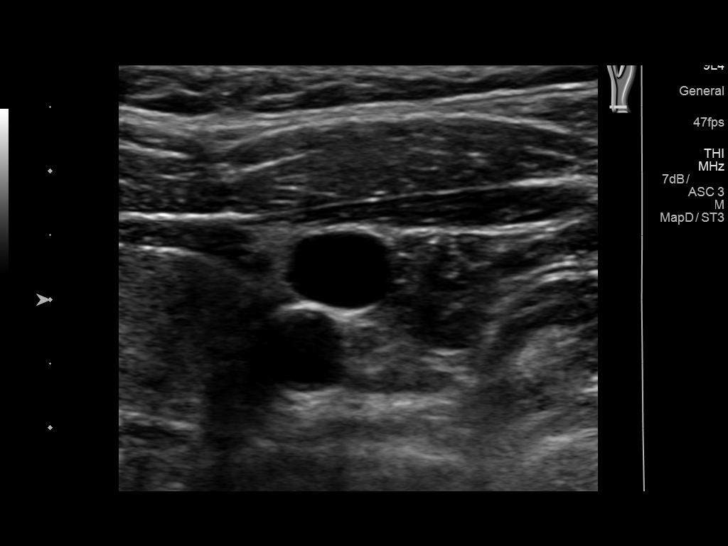
[im 38/68]
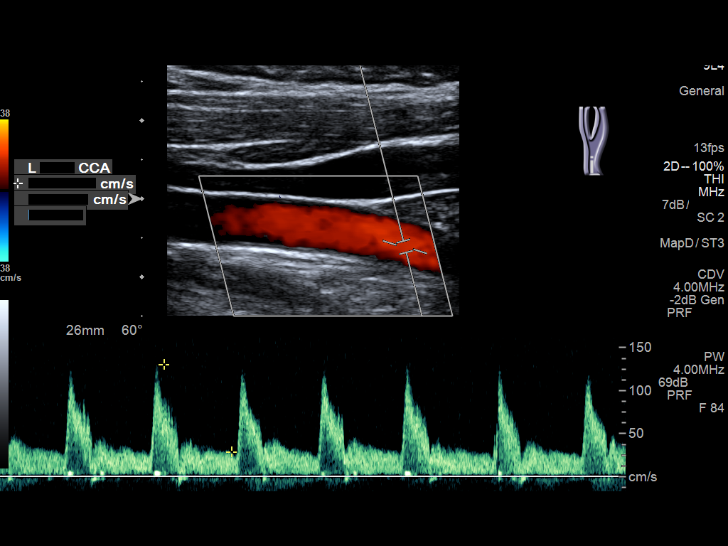
[im 44/68]
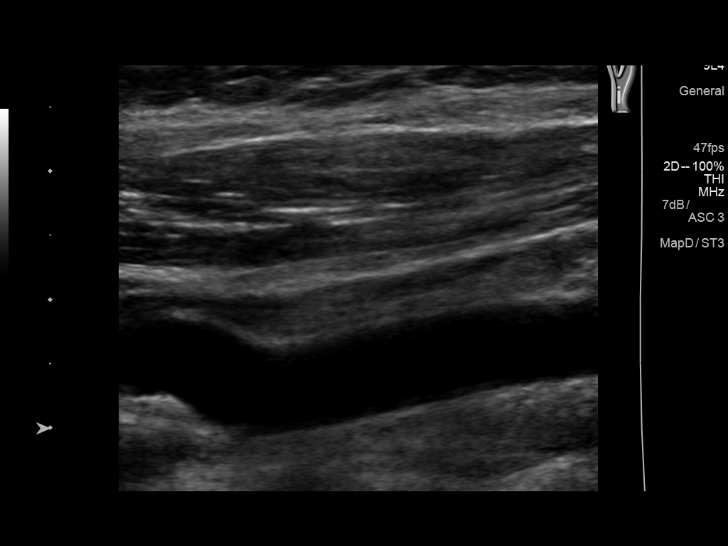
[im 50/68]
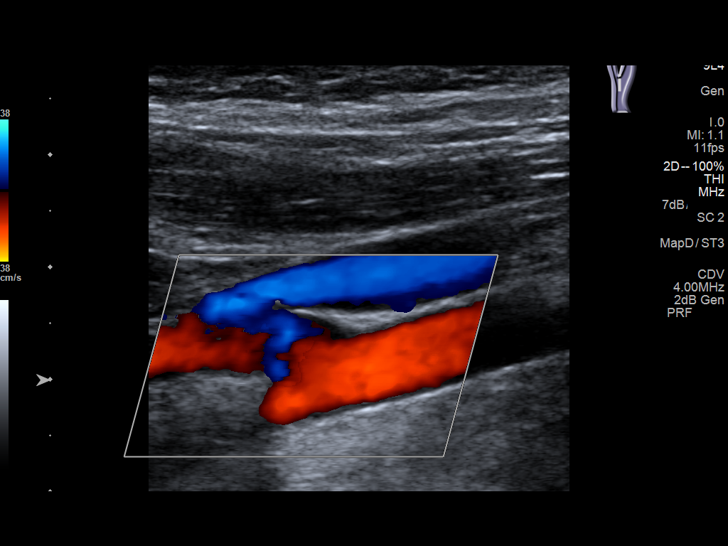
[im 56/68]
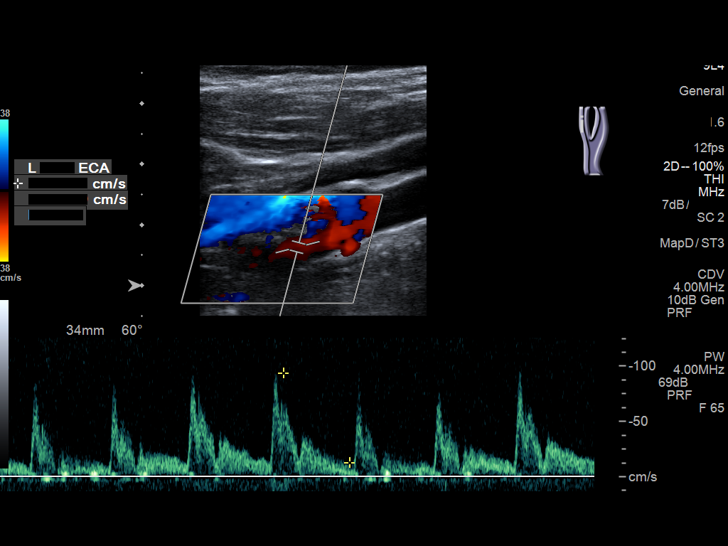
[im 62/68]
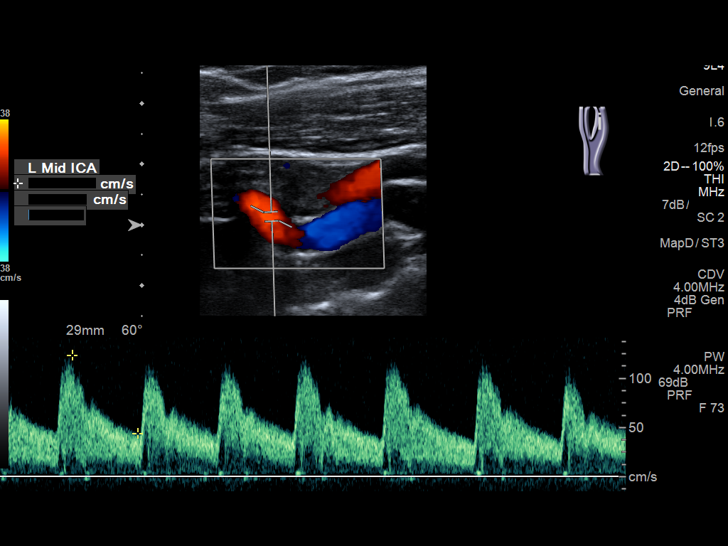
[im 68/68]
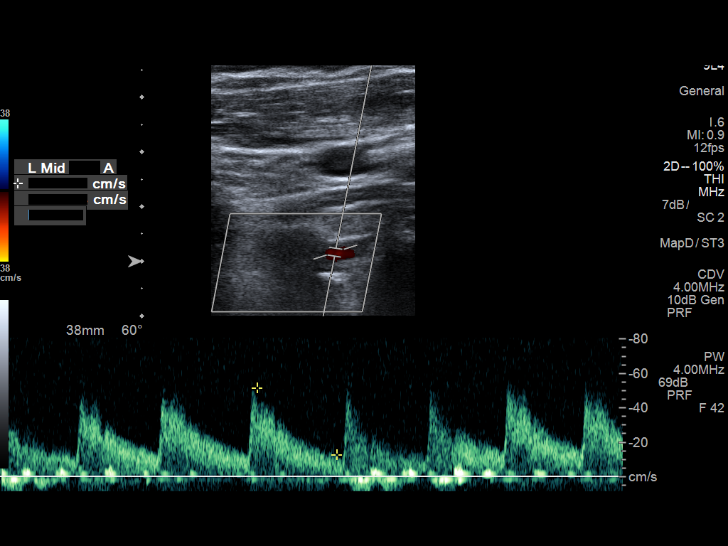

[13 of 24 positions shown; findings below may reference images not displayed]

FINDINGS: Criteria: Quantification of carotid stenosis is based on velocity
parameters that correlate the residual internal carotid diameter
with NASCET-based stenosis levels, using the diameter of the distal
internal carotid lumen as the denominator for stenosis measurement.

The following velocity measurements were obtained:

RIGHT

ICA:  111 cm/sec

CCA:  98 cm/sec

SYSTOLIC ICA/CCA RATIO:

DIASTOLIC ICA/CCA RATIO:

ECA:  86 cm/sec

LEFT

ICA:  158 cm/sec

CCA:  97 cm/sec

SYSTOLIC ICA/CCA RATIO:

DIASTOLIC ICA/CCA RATIO:

ECA:  94 cm/sec

RIGHT CAROTID ARTERY: Right carotid arteries are patent without
significant plaque or stenosis. External carotid artery is patent
with normal waveform. Normal waveforms and velocities in the
internal carotid artery.

RIGHT VERTEBRAL ARTERY: Antegrade flow and normal waveform in the
right vertebral artery.

LEFT CAROTID ARTERY: Left carotid arteries are patent without
significant plaque or stenosis. External carotid artery is patent
with normal waveform. Peak systolic velocity in the distal left
internal carotid artery is mildly elevated but this is probably
secondary to tortuosity of the vessel.

LEFT VERTEBRAL ARTERY: Antegrade flow and normal waveform in the
left vertebral artery.
IMPRESSION: Carotid arteries are patent without significant plaque or stenosis.

## 2017-10-09 ENCOUNTER — Telehealth: Payer: 59 | Admitting: Family

## 2017-10-09 DIAGNOSIS — J0110 Acute frontal sinusitis, unspecified: Secondary | ICD-10-CM

## 2017-10-09 MED ORDER — AMOXICILLIN-POT CLAVULANATE 875-125 MG PO TABS: 1.0000 | ORAL_TABLET | Freq: Two times a day (BID) | ORAL | 0 refills | Status: DC

## 2017-10-09 NOTE — Progress Notes (Signed)

## 2017-10-24 DIAGNOSIS — Z01419 Encounter for gynecological examination (general) (routine) without abnormal findings: Secondary | ICD-10-CM | POA: Diagnosis not present

## 2017-10-24 DIAGNOSIS — Z6834 Body mass index (BMI) 34.0-34.9, adult: Secondary | ICD-10-CM | POA: Diagnosis not present

## 2017-10-24 DIAGNOSIS — Z1231 Encounter for screening mammogram for malignant neoplasm of breast: Secondary | ICD-10-CM | POA: Diagnosis not present

## 2017-12-16 ENCOUNTER — Other Ambulatory Visit: Payer: Self-pay | Admitting: Family Medicine

## 2018-05-26 ENCOUNTER — Other Ambulatory Visit: Payer: Self-pay | Admitting: Family Medicine

## 2018-05-26 ENCOUNTER — Encounter: Payer: Self-pay | Admitting: Gastroenterology

## 2018-09-22 ENCOUNTER — Telehealth: Payer: 59 | Admitting: Family

## 2018-09-22 DIAGNOSIS — J329 Chronic sinusitis, unspecified: Secondary | ICD-10-CM

## 2018-09-22 DIAGNOSIS — B9789 Other viral agents as the cause of diseases classified elsewhere: Secondary | ICD-10-CM | POA: Diagnosis not present

## 2018-09-22 MED ORDER — FLUTICASONE PROPIONATE 50 MCG/ACT NA SUSP: 2.0000 | Freq: Every day | NASAL | 6 refills | Status: DC

## 2018-09-22 NOTE — Progress Notes (Signed)
We are sorry that you are not feeling well.  Here is how we plan to help!  Based on what you have shared with me it looks like you have sinusitis.  Sinusitis is inflammation and infection in the sinus cavities of the head.  Based on your presentation I believe you most likely have Acute Viral Sinusitis.This is an infection most likely caused by a virus. There is not specific treatment for viral sinusitis other than to help you with the symptoms until the infection runs its course.  You may use an oral decongestant such as Mucinex D or if you have glaucoma or high blood pressure use plain Mucinex. Saline nasal spray help and can safely be used as often as needed for congestion, I have prescribed: Fluticasone nasal spray two sprays in each nostril once a day.  Approximately five minutes was spent reviewing a document in patient's chart.  Providers prescribe antibiotics to treat infections caused by bacteria. Antibiotics are very powerful in treating bacterial infections when they are used properly. To maintain their effectiveness, they should be used only when necessary. Overuse of antibiotics has resulted in the development of superbugs that are resistant to treatment!    After careful review of your answers, I would not recommend an antibiotic for your condition.  Antibiotics are not effective against viruses and therefore should not be used to treat them. Common examples of infections caused by viruses include colds and flu    Some authorities believe that zinc sprays or the use of Echinacea may shorten the course of your symptoms.  Sinus infections are not as easily transmitted as other respiratory infection, however we still recommend that you avoid close contact with loved ones, especially the very young and elderly.  Remember to wash your hands thoroughly throughout the day as this is the number one way to prevent the spread of infection!  Home Care:  Only take medications as instructed by  your medical team.  Do not take these medications with alcohol.  A steam or ultrasonic humidifier can help congestion.  You can place a towel over your head and breathe in the steam from hot water coming from a faucet.  Avoid close contacts especially the very young and the elderly.  Cover your mouth when you cough or sneeze.  Always remember to wash your hands.  Get Help Right Away If:  You develop worsening fever or sinus pain.  You develop a severe head ache or visual changes.  Your symptoms persist after you have completed your treatment plan.  Make sure you  Understand these instructions.  Will watch your condition.  Will get help right away if you are not doing well or get worse.  Your e-visit answers were reviewed by a board certified advanced clinical practitioner to complete your personal care plan.  Depending on the condition, your plan could have included both over the counter or prescription medications.  If there is a problem please reply  once you have received a response from your provider.  Your safety is important to [place].  If you have drug allergies check your prescription carefully.    You can use MyChart to ask questions about today's visit, request a non-urgent call back, or ask for a work or school excuse for 24 hours related to this e-Visit. If it has been greater than 24 hours you will need to follow up with your provider, or enter a new e-Visit to address those concerns.  You will get an e-mail  in the next two days asking about your experience.  I hope that your e-visit has been valuable and will speed your recovery. Thank you for using e-visits.

## 2019-01-07 DIAGNOSIS — M25551 Pain in right hip: Secondary | ICD-10-CM | POA: Diagnosis not present

## 2019-01-07 DIAGNOSIS — M7061 Trochanteric bursitis, right hip: Secondary | ICD-10-CM | POA: Diagnosis not present

## 2019-01-07 DIAGNOSIS — M5412 Radiculopathy, cervical region: Secondary | ICD-10-CM | POA: Diagnosis not present

## 2019-01-08 ENCOUNTER — Encounter: Payer: Self-pay | Admitting: Family Medicine

## 2019-01-08 ENCOUNTER — Other Ambulatory Visit: Payer: Self-pay

## 2019-01-08 ENCOUNTER — Ambulatory Visit: Payer: 59 | Admitting: Family Medicine

## 2019-01-08 VITALS — BP 130/80 | HR 81 | Temp 98.6°F | Resp 16 | Ht 66.0 in | Wt 215.3 lb

## 2019-01-08 DIAGNOSIS — K635 Polyp of colon: Secondary | ICD-10-CM | POA: Insufficient documentation

## 2019-01-08 DIAGNOSIS — F341 Dysthymic disorder: Secondary | ICD-10-CM | POA: Diagnosis not present

## 2019-01-08 DIAGNOSIS — Z800 Family history of malignant neoplasm of digestive organs: Secondary | ICD-10-CM | POA: Insufficient documentation

## 2019-01-08 DIAGNOSIS — M25551 Pain in right hip: Secondary | ICD-10-CM | POA: Diagnosis not present

## 2019-01-08 DIAGNOSIS — K2100 Gastro-esophageal reflux disease with esophagitis, without bleeding: Secondary | ICD-10-CM

## 2019-01-08 DIAGNOSIS — Z6834 Body mass index (BMI) 34.0-34.9, adult: Secondary | ICD-10-CM

## 2019-01-08 DIAGNOSIS — F419 Anxiety disorder, unspecified: Secondary | ICD-10-CM

## 2019-01-08 DIAGNOSIS — Z13220 Encounter for screening for lipoid disorders: Secondary | ICD-10-CM

## 2019-01-08 DIAGNOSIS — R9439 Abnormal result of other cardiovascular function study: Secondary | ICD-10-CM | POA: Diagnosis not present

## 2019-01-08 DIAGNOSIS — G5603 Carpal tunnel syndrome, bilateral upper limbs: Secondary | ICD-10-CM | POA: Diagnosis not present

## 2019-01-08 DIAGNOSIS — R002 Palpitations: Secondary | ICD-10-CM

## 2019-01-08 DIAGNOSIS — Z1239 Encounter for other screening for malignant neoplasm of breast: Secondary | ICD-10-CM

## 2019-01-08 DIAGNOSIS — E6609 Other obesity due to excess calories: Secondary | ICD-10-CM | POA: Diagnosis not present

## 2019-01-08 MED ORDER — METOPROLOL SUCCINATE ER 25 MG PO TB24: 25.0000 mg | ORAL_TABLET | Freq: Every day | ORAL | 3 refills | Status: DC

## 2019-01-08 MED ORDER — BUPROPION HCL ER (XL) 300 MG PO TB24: 300.0000 mg | ORAL_TABLET | Freq: Every day | ORAL | 1 refills | Status: DC

## 2019-01-08 NOTE — Progress Notes (Signed)
Name: Jill Cook   MRN: [id number]    DOB: 10-01-1965   Date:01/08/2019       Progress Note  Subjective  Chief Complaint  Chief Complaint  Patient presents with  . Establish Care  . Hip Pain    she had an injection yesterday. Hoping the pain will resolve.    HPI  RIGHT Hip Pain: Seeing Vance Peper PA-C with [facility], has injection done yesterday and is having some relief already.  No weakness, no numbness/tingling. ORtho suspects bursitis of the right hip.  Bilateral hand numbness/tingling: She gets intermittent numbness and tingling in the bilateral wrists for about a year now.  She plays piano and organ at her church, she works as a night shift oncology/palliative/stroke Loss adjuster, chartered. Does endorse some weakness in the hands intermittently, harder to open jars occasionally. Does not hands, but will call back if she'd like referral.  HTN: She lost 32lbs last year and weaned herself off of the lisinopril last year because her BP was so low.  Over the last several months she has gained about 15lbs back.  BP is under good control today and she checks regularly at home and has not been seeing any readings over 140/90.  Denies chest pain.  Has occasional shortness of breath - suspects this may be stress related.  She does have occasional palpitations - she was seeing Dr. Saunders Revel. His notes are reviewed, Echo and Stress test are reviewed.    Shortness of breath/GERD: She will have episodes of feeling short of breath, and like her throat is tight. Has difficulty swallowing for a few hours at a time. She has not had thyroid issues in the past, but has had GERD in the past. She does endorse some regurgitation and heartburn lately.  Discussed options for evaluation - she would like to see GI first and have some thyroid labs done today.  Will consider Thyroid US if GI work-up is negative.   History of Colon Polyps: Has colonoscopy in 2017 and had polyps, told to repeat in 3 years.  We will refer today.  Maternal Aunt and two maternal cousins had colorectal cancer. No changes in BM's - no blood in stool, dark and tarry stool, mucus in stool, or constipation/diarrhea.  Does often feel like she has incomplete bowel emptying.  Has history of endometriosis and suspects this may be the cause.   Depression:  She has been taking Wellbutrin for several years, weaned herself off last year, but noticed a decrease in her mood and restarted 2 weeks ago, so she restarted the medication. Has noticed some improvement with restarting the medication.  No SI/HI.    Office Visit from 01/08/2019 in [facility]  PHQ-9 Total Score  5     Obesity: She has struggled for several years; lost 32lbs last year, has gained much of that back.  She is not able exercise due to hip pain, but is hoping to change this soon with recent injection.  She is open to medication management, medical weight management.  She does not want to take metformin, but is open to injectables.  No family history of thyroid cancer, no personal history of thyroid cancer or pancreatitis.   Patient Active Problem List   Diagnosis Date Noted  . Obese 08/23/2015  . 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 Edema 08/17/2015  . Generalized anxiety disorder 08/17/2015  . Depression 08/17/2015  . RLQ abdominal pain 03/29/2015  . S/P  right knee arthroscopy 10/06/2013  . SPLENOMEGALY 10/15/2008  . ESOPHAGITIS 10/14/2008  . NAUSEA 10/14/2008  . DYSPEPSIA 04/14/2008  . RECTAL FISSURE 04/14/2008  . Abdominal pain, left upper quadrant 04/14/2008  . GERD 04/12/2008  . DUODENITIS 04/12/2008  . IRRITABLE BOWEL SYNDROME 04/12/2008  . ANEMIA, IRON DEFICIENCY, HX OF 04/12/2008  . ESOPHAGITIS, HX OF 04/12/2008    Past Surgical History:  Procedure Laterality Date  . ANAL FISSURE REPAIR  06-13-2000  . BUNIONECTOMY Right 1991  . CESAREAN SECTION  1994  . CONVERSION TO TOTAL KNEE Right 08/22/2015   Procedure: partial knee CONVERSION  TO RIGHT TOTAL KNEE;  Surgeon: Paralee Cancel, MD;  Location: [facility];  Service: Orthopedics;  Laterality: Right;  . D & C HYSTEROSCOPY /  NOVASURE ENDOMETRIAL ABLATION  11-15-2005  . KNEE ARTHROSCOPY W/ MENISCECTOMY Right   . KNEE ARTHROSCOPY WITH MEDIAL MENISECTOMY Right 10/06/2013   Procedure: RIGHT KNEE ARTHROSCOPY WITH CHONDROPLASTY;  Surgeon: Sydnee Cabal, MD;  Location: [facility];  Service: Orthopedics;  Laterality: Right;  . LAPAROSCOPY N/A 11/01/2015   Procedure: LAPAROSCOPY DIAGNOSTIC;  Surgeon: Molli Posey, MD;  Location: [facility];  Service: Gynecology;  Laterality: N/A;  . RIGHT KNEE PATELLOFEMORAL ARTHROPLASTY  06-10-2008  . SEPTOPLASTY  2000  . TONSILLECTOMY  AGE 78  . TRANSTHORACIC ECHOCARDIOGRAM  06-03-2008   NORMAL /  EF 60%  . TUBAL LIGATION  1996    Family History  Problem Relation Age of Onset  . Hypertension Mother   . Thyroid disease Mother   . Atrial fibrillation Mother   . Stroke Mother   . Heart disease Father   . Heart attack Father 47  . Hypertension Paternal Grandfather   . Diabetes Paternal Grandfather   . Uterine cancer Paternal Aunt   . Colon cancer Maternal Aunt        dx in her 74's  . Esophageal cancer Maternal Grandfather     Social History   Socioeconomic History  . Marital status: Married    Spouse name: Not on file  . Number of children: 3  . Years of education: Not on file  . Highest education level: Not on file  Occupational History  . Occupation: Therapist, sports    Comment: Chief of Staff  Social Needs  . Financial resource strain: Not hard at all  . Food insecurity    Worry: Never true    Inability: Never true  . Transportation needs    Medical: No    Non-medical: No  Tobacco Use  . Smoking status: Never Smoker  . Smokeless tobacco: Never Used  Substance and Sexual Activity  . Alcohol use: Yes    Comment: 1 drink per month  . Drug use: No  . Sexual activity: Not on file   Lifestyle  . Physical activity    Days per week: 3 days    Minutes per session: Not on file  . Stress: To some extent  Relationships  . Social connections    Talks on phone: More than three times a week    Gets together: Three times a week    Attends religious service: More than 4 times per year    Active member of club or organization: Yes    Attends meetings of clubs or organizations: 1 to 4 times per year    Relationship status: Married  . Intimate partner violence    Fear of current or ex partner: No    Emotionally abused: No  Physically abused: No    Forced sexual activity: No  Other Topics Concern  . Not on file  Social History Narrative  . Not on file     Current Outpatient Medications:  .  aspirin EC 81 MG tablet, Take 81 mg by mouth daily., Disp: , Rfl:  .  BIOTIN PO, Take by mouth., Disp: , Rfl:  .  buPROPion (WELLBUTRIN XL) 300 MG 24 hr tablet, TAKE 1 TABLET BY MOUTH DAILY., Disp: 90 tablet, Rfl: 3 .  Calcium Carb-Cholecalciferol (CALCIUM-VITAMIN D) 600-400 MG-UNIT TABS, Take 1 tablet by mouth daily., Disp: , Rfl:  .  cetirizine (ZYRTEC) 10 MG tablet, Take 10 mg by mouth daily., Disp: , Rfl:  .  fluticasone (FLONASE) 50 MCG/ACT nasal spray, Place 2 sprays into both nostrils daily., Disp: 16 g, Rfl: 6 .  lisinopril (PRINIVIL,ZESTRIL) 10 MG tablet, TAKE 1 TABLET BY MOUTH DAILY., Disp: 30 tablet, Rfl: 11 .  metoprolol tartrate (LOPRESSOR) 25 MG tablet, Take 1 tablet (25 mg total) by mouth 2 (two) times daily., Disp: 60 tablet, Rfl: 5 .  amoxicillin-clavulanate (AUGMENTIN) 875-125 MG tablet, Take 1 tablet by mouth 2 (two) times daily. (Patient not taking: Reported on 01/08/2019), Disp: 14 tablet, Rfl: 0 .  benzonatate (TESSALON PERLES) 100 MG capsule, Take 1-2 capsules (100-200 mg total) by mouth every 8 (eight) hours as needed for cough. (Patient not taking: Reported on 06/03/2017), Disp: 30 capsule, Rfl: 0 .  docusate sodium (COLACE) 100 MG capsule, Take 1 capsule (100  mg total) by mouth 2 (two) times daily. (Patient not taking: Reported on 06/03/2017), Disp: 10 capsule, Rfl: 0 .  HYDROcodone-homatropine (HYCODAN) 5-1.5 MG/5ML syrup, Take 5 mLs by mouth every 8 (eight) hours as needed for cough. (Patient not taking: Reported on 06/03/2017), Disp: 120 mL, Rfl: 0 .  indomethacin (INDOCIN) 50 MG capsule, Take 1 capsule (50 mg total) 2 (two) times daily with a meal by mouth. (Patient not taking: Reported on 01/08/2019), Disp: 60 capsule, Rfl: 1 .  levofloxacin (LEVAQUIN) 500 MG tablet, Take 1 tablet (500 mg total) by mouth daily. (Patient not taking: Reported on 06/03/2017), Disp: 7 tablet, Rfl: 0 .  metroNIDAZOLE (FLAGYL) 500 MG tablet, Take 1 tablet (500 mg total) by mouth 2 (two) times daily. (Patient not taking: Reported on 06/03/2017), Disp: 14 tablet, Rfl: 0 .  oxyCODONE-acetaminophen (ROXICET) 5-325 MG tablet, Take 1 tablet every 6 (six) hours as needed by mouth. (Patient not taking: Reported on 01/08/2019), Disp: 12 tablet, Rfl: 0 .  polyethylene glycol (MIRALAX / GLYCOLAX) packet, Take 17 g by mouth 2 (two) times daily. (Patient not taking: Reported on 06/03/2017), Disp: 14 each, Rfl: 0 .  predniSONE (DELTASONE) 5 MG tablet, Take 1 tablet (5 mg total) by mouth as directed. Sterapred 21 dose taper (Patient not taking: Reported on 06/03/2017), Disp: 21 tablet, Rfl: 0  Allergies  Allergen Reactions  . Codeine Nausea And Vomiting  . Hydrocodone Nausea Only  . Hydromorphone Hcl Nausea And Vomiting  . Morphine Hives and Nausea And Vomiting    I personally reviewed active problem list, medication list, allergies, notes from last encounter, lab results with the patient/caregiver today.   ROS  Ten systems reviewed and is negative except as mentioned in HPI   Objective  Vitals:   01/08/19 0843  BP: 130/80  Pulse: 81  Resp: 16  Temp: 98.6 F (37 C)  TempSrc: Oral  SpO2: 97%  Weight: 215 lb 4.8 oz (97.7 kg)  Height: 5\' 6"  (1.676 m)  Body mass index is  34.75 kg/m.  Physical Exam  Constitutional: Patient appears well-developed and well-nourished. No distress.  HENT: Head: Normocephalic and atraumatic. Ears: bilateral TMs with no erythema or effusion; Nose: Nose normal. Mouth/Throat: Oropharynx is clear and moist. No oropharyngeal exudate or tonsillar swelling.  Eyes: Conjunctivae and EOM are normal. No scleral icterus.  Pupils are equal, round, and reactive to light.  Neck: Normal range of motion. Neck supple. No JVD present. No thyromegaly present.  Cardiovascular: Normal rate, regular rhythm and normal heart sounds.  No murmur heard. No BLE edema. Pulmonary/Chest: Effort normal and breath sounds normal. No respiratory distress. Abdominal: Soft. Bowel sounds are normal, no distension. There is no tenderness. No masses. Musculoskeletal: Normal range of motion, no joint effusions. No gross deformities. Bilateral negative tinel's; bilateral positive phalen's L>R. Neurological: Pt is alert and oriented to person, place, and time. No cranial nerve deficit. Coordination, balance, strength, speech and gait are normal.  Skin: Skin is warm and dry. No rash noted. No erythema.  Psychiatric: Patient has a normal mood and affect. behavior is normal. Judgment and thought content normal.  No results found for this or any previous visit (from the past 72 hour(s)).   PHQ2/9: Depression screen PHQ 2/9 01/08/2019  Decreased Interest 0  Down, Depressed, Hopeless 1  PHQ - 2 Score 1  Altered sleeping 1  Tired, decreased energy 2  Change in appetite 0  Feeling bad or failure about yourself  1  Trouble concentrating 0  Moving slowly or fidgety/restless 0  Suicidal thoughts 0  PHQ-9 Score 5  Difficult doing work/chores Not difficult at all   PHQ-2/9 Result is positive.    Fall Risk: Fall Risk  01/08/2019  Falls in the past year? 0  Number falls in past yr: 0  Injury with Fall? 0    Functional Status Survey: Is the patient deaf or have difficulty  hearing?: No Does the patient have difficulty seeing, even when wearing glasses/contacts?: No Does the patient have difficulty concentrating, remembering, or making decisions?: No Does the patient have difficulty walking or climbing stairs?: No Does the patient have difficulty dressing or bathing?: No Does the patient have difficulty doing errands alone such as visiting a doctor's office or shopping?: No  Assessment & Plan  1. Right hip pain - See Ortho  2. Bilateral carpal tunnel syndrome - Will call back if wanting hand referral  3. Gastroesophageal reflux disease with esophagitis - Ambulatory referral to Gastroenterology  4. Polyp of colon, unspecified part of colon, unspecified type - Ambulatory referral to Gastroenterology  5. Family history of colorectal cancer - Ambulatory referral to Gastroenterology  6. Dysthymia - buPROPion (WELLBUTRIN XL) 300 MG 24 hr tablet; Take 1 tablet (300 mg total) by mouth daily.  Dispense: 90 tablet; Refill: 1  7. Anxiety - buPROPion (WELLBUTRIN XL) 300 MG 24 hr tablet; Take 1 tablet (300 mg total) by mouth daily.  Dispense: 90 tablet; Refill: 1 - CBC with Differential/Platelet  8. Abnormal stress test - Ambulatory referral to Cardiology  9. Palpitations - Ambulatory referral to Cardiology - metoprolol succinate (TOPROL-XL) 25 MG 24 hr tablet; Take 1 tablet (25 mg total) by mouth daily.  Dispense: 90 tablet; Refill: 3 - Hemoglobin A1c - CBC with Differential/Platelet - Lipid panel - Thyroid Panel With TSH  10. Breast cancer screening - MM 3D SCREEN BREAST BILATERAL; Future  11. Class 1 obesity due to excess calories with serious comorbidity and body mass index (BMI) of 34.0 to  34.9 in adult - Discussed importance of 150 minutes of physical activity weekly, eat two servings of fish weekly, eat one serving of tree nuts ( cashews, pistachios, pecans, almonds.Marland Kitchen) every other day, eat 6 servings of fruit/vegetables daily and drink plenty of  water and avoid sweet beverages.  - COMPLETE METABOLIC PANEL WITH GFR - Hemoglobin A1c - CBC with Differential/Platelet  12. Lipid screening - Lipid panel

## 2019-01-08 NOTE — Patient Instructions (Signed)
Ozempic, Saxenda, victoza, and trulicty

## 2019-01-09 ENCOUNTER — Encounter: Payer: Self-pay | Admitting: Family Medicine

## 2019-01-09 LAB — THYROID PANEL WITH TSH
Free Thyroxine Index: 2.4 (ref 1.4–3.8)
T3 Uptake: 29 % (ref 22–35)
T4, Total: 8.2 ug/dL (ref 5.1–11.9)
TSH: 0.67 mIU/L

## 2019-01-09 LAB — CBC WITH DIFFERENTIAL/PLATELET
Absolute Monocytes: 338 cells/uL (ref 200–950)
Basophils Absolute: 38 cells/uL (ref 0–200)
Basophils Relative: 0.5 %
Eosinophils Absolute: 53 cells/uL (ref 15–500)
Eosinophils Relative: 0.7 %
HCT: 38.1 % (ref 35.0–45.0)
Hemoglobin: 12.9 g/dL (ref 11.7–15.5)
Lymphs Abs: 1095 cells/uL (ref 850–3900)
MCH: 28.7 pg (ref 27.0–33.0)
MCHC: 33.9 g/dL (ref 32.0–36.0)
MCV: 84.7 fL (ref 80.0–100.0)
MPV: 10.7 fL (ref 7.5–12.5)
Monocytes Relative: 4.5 %
Neutro Abs: 5978 cells/uL (ref 1500–7800)
Neutrophils Relative %: 79.7 %
Platelets: 193 10*3/uL (ref 140–400)
RBC: 4.5 10*6/uL (ref 3.80–5.10)
RDW: 13.5 % (ref 11.0–15.0)
Total Lymphocyte: 14.6 %
WBC: 7.5 10*3/uL (ref 3.8–10.8)

## 2019-01-09 LAB — COMPLETE METABOLIC PANEL WITH GFR
AG Ratio: 2.3 (calc) (ref 1.0–2.5)
ALT: 29 U/L (ref 6–29)
AST: 19 U/L (ref 10–35)
Albumin: 4.5 g/dL (ref 3.6–5.1)
Alkaline phosphatase (APISO): 61 U/L (ref 37–153)
BUN: 12 mg/dL (ref 7–25)
CO2: 28 mmol/L (ref 20–32)
Calcium: 9.7 mg/dL (ref 8.6–10.4)
Chloride: 105 mmol/L (ref 98–110)
Creat: 0.92 mg/dL (ref 0.50–1.05)
GFR, Est African American: 83 mL/min/{1.73_m2} (ref >=60)
GFR, Est Non African American: 72 mL/min/{1.73_m2} (ref >=60)
Globulin: 2 g/dL (calc) (ref 1.9–3.7)
Glucose, Bld: 111 mg/dL — ABNORMAL HIGH (ref 65–99)
Potassium: 4.4 mmol/L (ref 3.5–5.3)
Sodium: 141 mmol/L (ref 135–146)
Total Bilirubin: 0.6 mg/dL (ref 0.2–1.2)
Total Protein: 6.5 g/dL (ref 6.1–8.1)

## 2019-01-09 LAB — LIPID PANEL
Cholesterol: 172 mg/dL (ref <200)
HDL: 49 mg/dL — ABNORMAL LOW (ref >=50)
LDL Cholesterol (Calc): 107 mg/dL (calc) — ABNORMAL HIGH
Non-HDL Cholesterol (Calc): 123 mg/dL (calc) (ref <130)
Total CHOL/HDL Ratio: 3.5 (calc) (ref <5.0)
Triglycerides: 75 mg/dL (ref <150)

## 2019-01-09 LAB — HEMOGLOBIN A1C
Hgb A1c MFr Bld: 5.5 % of total Hgb (ref <5.7)
Mean Plasma Glucose: 111 (calc)
eAG (mmol/L): 6.2 (calc)

## 2019-01-23 ENCOUNTER — Encounter: Payer: Self-pay | Admitting: Family Medicine

## 2019-01-23 DIAGNOSIS — Z6834 Body mass index (BMI) 34.0-34.9, adult: Secondary | ICD-10-CM

## 2019-01-23 DIAGNOSIS — E6609 Other obesity due to excess calories: Secondary | ICD-10-CM

## 2019-01-27 MED ORDER — NALTREXONE-BUPROPION HCL ER 8-90 MG PO TB12: ORAL_TABLET | ORAL | 1 refills | Status: DC

## 2019-02-02 ENCOUNTER — Telehealth: Payer: Self-pay | Admitting: Family Medicine

## 2019-02-02 DIAGNOSIS — E6609 Other obesity due to excess calories: Secondary | ICD-10-CM

## 2019-02-02 NOTE — Telephone Encounter (Signed)
Pt notified, she said she will try

## 2019-02-02 NOTE — Telephone Encounter (Signed)
Please let patient know that insurance will only approve contrave if she is also enrolled in a medical weight loss program.  I am happy to refer her if she'd like to Dr. Leafy Ro.

## 2019-02-03 NOTE — Addendum Note (Signed)
Addended by: Hubbard Hartshorn on: 02/03/2019 07:35 AM   Modules accepted: Orders

## 2019-03-02 DIAGNOSIS — M7061 Trochanteric bursitis, right hip: Secondary | ICD-10-CM | POA: Diagnosis not present

## 2019-03-02 DIAGNOSIS — G8929 Other chronic pain: Secondary | ICD-10-CM | POA: Diagnosis not present

## 2019-03-02 DIAGNOSIS — M25551 Pain in right hip: Secondary | ICD-10-CM | POA: Diagnosis not present

## 2019-03-04 ENCOUNTER — Ambulatory Visit: Payer: 59 | Admitting: Gastroenterology

## 2019-03-11 ENCOUNTER — Encounter: Payer: 59 | Admitting: Family Medicine

## 2019-03-17 ENCOUNTER — Encounter: Payer: 59 | Admitting: Family Medicine

## 2019-03-19 ENCOUNTER — Encounter: Payer: 59 | Admitting: Nurse Practitioner

## 2019-03-20 ENCOUNTER — Encounter: Payer: Self-pay | Admitting: Nurse Practitioner

## 2019-03-20 ENCOUNTER — Other Ambulatory Visit: Payer: Self-pay

## 2019-03-20 ENCOUNTER — Ambulatory Visit (INDEPENDENT_AMBULATORY_CARE_PROVIDER_SITE_OTHER): Payer: 59 | Admitting: Nurse Practitioner

## 2019-03-20 ENCOUNTER — Other Ambulatory Visit (HOSPITAL_COMMUNITY)
Admission: RE | Admit: 2019-03-20 | Discharge: 2019-03-20 | Disposition: A | Discharge status: Home / Self Care | Payer: 59 | Source: Ambulatory Visit | Attending: Family Medicine | Admitting: Family Medicine

## 2019-03-20 VITALS — BP 126/72 | HR 75 | Temp 96.9°F | Resp 16 | Ht 65.75 in | Wt 222.2 lb

## 2019-03-20 DIAGNOSIS — Z124 Encounter for screening for malignant neoplasm of cervix: Secondary | ICD-10-CM

## 2019-03-20 DIAGNOSIS — Z1159 Encounter for screening for other viral diseases: Secondary | ICD-10-CM | POA: Diagnosis not present

## 2019-03-20 DIAGNOSIS — Z111 Encounter for screening for respiratory tuberculosis: Secondary | ICD-10-CM

## 2019-03-20 DIAGNOSIS — Z23 Encounter for immunization: Secondary | ICD-10-CM | POA: Diagnosis not present

## 2019-03-20 DIAGNOSIS — Z0000 Encounter for general adult medical examination without abnormal findings: Secondary | ICD-10-CM | POA: Diagnosis not present

## 2019-03-20 DIAGNOSIS — Z862 Personal history of diseases of the blood and blood-forming organs and certain disorders involving the immune mechanism: Secondary | ICD-10-CM | POA: Diagnosis not present

## 2019-03-20 NOTE — Progress Notes (Signed)
Name: Jill Cook   MRN: [id number]    DOB: 1965-08-20   Date:03/20/2019       Progress Note  Subjective  Chief Complaint  Chief Complaint  Patient presents with  . Annual Exam    HPI   Patient presents for annual CPE.  Diet:  Typically eats 2 meals a day  Eats a lot of vegetables and some fruits Snacks on pita chip and pimento cheese, apples and peanut butter, [place] rolls, pickles Proteins: deli meats, greek yogurt and powdered peanut butter, cheese, [place] breast Drink: mostly water- about 64 ounces a day Drinks one cup of coffee and occasional green tea.  Exercise:  2-3 days of walking a week and some resistance training   USPSTF grade A and B recommendations    Office Visit from 03/20/2019 in [facility]  AUDIT-C Score  1     Depression: Phq 9 is  negative Depression screen [facility] 2/9 03/20/2019 01/08/2019  Decreased Interest 0 0  Down, Depressed, Hopeless 0 1  PHQ - 2 Score 0 1  Altered sleeping 1 1  Tired, decreased energy 1 2  Change in appetite 0 0  Feeling bad or failure about yourself  0 1  Trouble concentrating 0 0  Moving slowly or fidgety/restless 0 0  Suicidal thoughts 0 0  PHQ-9 Score 2 5  Difficult doing work/chores Not difficult at all Not difficult at all  tiredness is from work schedule- works nights.  Hypertension: BP Readings from Last 3 Encounters:  03/20/19 126/72  01/08/19 130/80  06/03/17 107/75   Obesity: Wt Readings from Last 3 Encounters:  03/20/19 222 lb 3.2 oz (100.8 kg)  01/08/19 215 lb 4.8 oz (97.7 kg)  06/03/17 217 lb (98.4 kg)   BMI Readings from Last 3 Encounters:  03/20/19 36.14 kg/m  01/08/19 34.75 kg/m  06/03/17 35.02 kg/m    Hep C Screening: ordered  STD testing and prevention (HIV/chl/gon/syphilis): denies  Intimate partner violence: denies  Sexual History/Pain during Intercourse: denies  Menstrual History/LMP/Abnormal Bleeding: denies   Advanced Care Planning: A voluntary  discussion about advance care planning including the explanation and discussion of advance directives.  Discussed health care proxy and Living will, and the patient was able to identify a health care proxy as husband.  Patient does not have a living will at present time. If patient does have living will, I have requested they bring this to the clinic to be scanned in to their chart.  Breast cancer: ordered No results found for: [facility]  Cervical cancer screening: due today   Lipids:  Lab Results  Component Value Date   CHOL 172 01/08/2019   CHOL 156 11/16/2016   CHOL 164 07/13/2013   Lab Results  Component Value Date   HDL 49 (L) 01/08/2019   HDL 40 (L) 11/16/2016   HDL 41 07/13/2013   Lab Results  Component Value Date   LDLCALC 107 (H) 01/08/2019   LDLCALC 101 (H) 11/16/2016   LDLCALC 95 07/13/2013   Lab Results  Component Value Date   TRIG 75 01/08/2019   TRIG 73 11/16/2016   TRIG 138 07/13/2013   Lab Results  Component Value Date   CHOLHDL 3.5 01/08/2019   CHOLHDL 3.9 11/16/2016   CHOLHDL 4.0 07/13/2013   No results found for: LDLDIRECT  Glucose:  Glucose, Bld  Date Value Ref Range Status  01/08/2019 111 (H) 65 - 99 mg/dL Final    Comment:    .  Fasting reference interval . For someone without known diabetes, a glucose value between 100 and 125 mg/dL is consistent with prediabetes and should be confirmed with a follow-up test. .   06/03/2017 120 (H) 65 - 99 mg/dL Final  10/22/2016 95 65 - 99 mg/dL Final    Skin cancer: discussed  Colorectal cancer: due 2019- set up for september; colonoscopy showed 3 small adenomas, based on this result she warrants a repeat colonoscopy to be done in 3 years.  Lung cancer:  Low Dose CT Chest recommended if Age 62-80 years, 30 pack-year currently smoking OR have quit w/in 15years. Patient does not qualify.     Patient Active Problem List   Diagnosis Date Noted  . Right hip pain 01/08/2019  . Bilateral  carpal tunnel syndrome 01/08/2019  . Polyp of colon 01/08/2019  . Family history of colorectal cancer 01/08/2019  . Obese 08/23/2015  . 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 Edema 08/17/2015  . Generalized anxiety disorder 08/17/2015  . Depression 08/17/2015  . RLQ abdominal pain 03/29/2015  . S/P right knee arthroscopy 10/06/2013  . SPLENOMEGALY 10/15/2008  . Gastroesophageal reflux disease with esophagitis 10/14/2008  . NAUSEA 10/14/2008  . DYSPEPSIA 04/14/2008  . RECTAL FISSURE 04/14/2008  . Abdominal pain, left upper quadrant 04/14/2008  . GERD 04/12/2008  . DUODENITIS 04/12/2008  . IRRITABLE BOWEL SYNDROME 04/12/2008  . ANEMIA, IRON DEFICIENCY, HX OF 04/12/2008  . ESOPHAGITIS, HX OF 04/12/2008    Past Surgical History:  Procedure Laterality Date  . ANAL FISSURE REPAIR  06-13-2000  . BUNIONECTOMY Right 1991  . CESAREAN SECTION  1994  . 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 D & C HYSTEROSCOPY /  NOVASURE ENDOMETRIAL ABLATION  11-15-2005  . KNEE ARTHROSCOPY W/ MENISCECTOMY Right   . KNEE ARTHROSCOPY WITH MEDIAL MENISECTOMY Right 10/06/2013   Procedure: RIGHT KNEE ARTHROSCOPY WITH CHONDROPLASTY;  Surgeon: Sydnee Cabal, MD;  Location: [facility];  Service: Orthopedics;  Laterality: Right;  . LAPAROSCOPY N/A 11/01/2015   Procedure: LAPAROSCOPY DIAGNOSTIC;  Surgeon: Molli Posey, MD;  Location: [facility];  Service: Gynecology;  Laterality: N/A;  . RIGHT KNEE PATELLOFEMORAL ARTHROPLASTY  06-10-2008  . SEPTOPLASTY  2000  . TONSILLECTOMY  AGE 30  . TRANSTHORACIC ECHOCARDIOGRAM  06-03-2008   NORMAL /  EF 60%  . TUBAL LIGATION  1996    Family History  Problem Relation Age of Onset  . Hypertension Mother   . Thyroid disease Mother   . Atrial fibrillation Mother   . Stroke  Mother   . Heart disease Father   . Heart attack Father 33  . Hypertension Paternal Grandfather   . Diabetes Paternal Grandfather   . Uterine cancer Paternal Aunt   . Colon cancer Maternal Aunt        dx in her 35's  . Esophageal cancer Maternal Grandfather     Social History   Socioeconomic History  . Marital status: Married    Spouse name: Not on file  . Number of children: 3  . Years of education: Not on file  . Highest education level: Not on file  Occupational History  . Occupation: Therapist, sports    Comment: Chief of Staff  Social Needs  . Financial resource strain: Not hard at all  . Food insecurity  Worry: Never true    Inability: Never true  . Transportation needs    Medical: No    Non-medical: No  Tobacco Use  . Smoking status: Never Smoker  . Smokeless tobacco: Never Used  Substance and Sexual Activity  . Alcohol use: Yes    Comment: 1 drink per month  . Drug use: No  . Sexual activity: Yes    Partners: Male  Lifestyle  . Physical activity    Days per week: 2 days    Minutes per session: 40 min  . Stress: To some extent  Relationships  . Social connections    Talks on phone: More than three times a week    Gets together: Three times a week    Attends religious service: More than 4 times per year    Active member of club or organization: Yes    Attends meetings of clubs or organizations: 1 to 4 times per year    Relationship status: Married  . Intimate partner violence    Fear of current or ex partner: No    Emotionally abused: No    Physically abused: No    Forced sexual activity: No  Other Topics Concern  . Not on file  Social History Narrative  . Not on file     Current Outpatient Medications:  .  aspirin EC 81 MG tablet, Take 81 mg by mouth daily., Disp: , Rfl:  .  BIOTIN PO, Take by mouth., Disp: , Rfl:  .  buPROPion (WELLBUTRIN XL) 300 MG 24 hr tablet, Take 1 tablet (300 mg total) by mouth daily., Disp: 90 tablet, Rfl: 1 .   Calcium Carb-Cholecalciferol (CALCIUM-VITAMIN D) 600-400 MG-UNIT TABS, Take 1 tablet by mouth daily., Disp: , Rfl:  .  cetirizine (ZYRTEC) 10 MG tablet, Take 10 mg by mouth daily., Disp: , Rfl:  .  fluticasone (FLONASE) 50 MCG/ACT nasal spray, Place 2 sprays into both nostrils daily., Disp: 16 g, Rfl: 6 .  metoprolol succinate (TOPROL-XL) 25 MG 24 hr tablet, Take 1 tablet (25 mg total) by mouth daily., Disp: 90 tablet, Rfl: 3 .  metoprolol tartrate (LOPRESSOR) 25 MG tablet, Take 1 tablet (25 mg total) by mouth 2 (two) times daily., Disp: 60 tablet, Rfl: 5 .  Naltrexone-buPROPion HCl ER 8-90 MG TB12, 1 tab QAM x7days, then 1 tab BID x7 days, then 2 tab QAM and 1 tab QPM x7 days, then 2 tab twice daily., Disp: 120 tablet, Rfl: 1  Allergies  Allergen Reactions  . Codeine Nausea And Vomiting  . Hydrocodone Nausea Only  . Hydromorphone Hcl Nausea And Vomiting  . Morphine Hives and Nausea And Vomiting     Review of Systems  Constitutional: Negative for chills, fever and malaise/fatigue.  Eyes: Negative for blurred vision and double vision.  Respiratory: Negative for cough and shortness of breath.   Cardiovascular: Negative for chest pain, palpitations and leg swelling.  Gastrointestinal: Positive for diarrhea. Negative for abdominal pain, blood in stool and constipation.  Genitourinary: Negative for dysuria.  Musculoskeletal: Negative for joint pain and myalgias.  Skin: Negative for rash.  Neurological: Negative for dizziness, weakness and headaches. Sensory change:   Psychiatric/Behavioral: Negative for depression and suicidal ideas. The patient is not nervous/anxious.      Objective  Vitals:   03/20/19 1043  BP: 126/72  Pulse: 75  Resp: 16  Temp: (!) 96.9 F (36.1 C)  TempSrc: Temporal  SpO2: 98%  Weight: 222 lb 3.2 oz (100.8 kg)  Height:  5' 5.75" (1.67 m)    Body mass index is 36.14 kg/m.  Physical Exam Constitutional: Patient appears well-developed and well-nourished.  No distress.  HENT: Head: Normocephalic and atraumatic. Ears: B TMs ok, no erythema or effusion; Nose: Nose normal. Mouth/Throat: Oropharynx is clear and moist. No oropharyngeal exudate.  Eyes: Conjunctivae and EOM are normal. Pupils are equal, round, and reactive to light. No scleral icterus.  Neck: Normal range of motion. Neck supple. No JVD present. No thyromegaly present.  Cardiovascular: Normal rate, regular rhythm and normal heart sounds.  No murmur heard. No BLE edema. Pulmonary/Chest: Effort normal and breath sounds normal. No respiratory distress. Abdominal: Soft. Bowel sounds are normal, no distension. There is no tenderness. no masses Breast: no lumps or masses, no nipple discharge or rashes FEMALE GENITALIA:  External genitalia normal External urethra normal Vaginal vault normal without discharge or lesions Cervix normal without discharge or lesions Bimanual exam normal without masses RECTAL: no rectal masses or hemorrhoids Musculoskeletal: Normal range of motion, no joint effusions. No gross deformities Neurological: he is alert and oriented to person, place, and time. No cranial nerve deficit. Coordination, balance, strength, speech and gait are normal.  Skin: Skin is warm and dry. No rash noted. No erythema.  Psychiatric: Patient has a normal mood and affect. behavior is normal. Judgment and thought content normal.    Fall Risk: Fall Risk  03/20/2019 01/08/2019  Falls in the past year? 0 0  Number falls in past yr: 0 0  Injury with Fall? 0 0     Functional Status Survey: Is the patient deaf or have difficulty hearing?: No Does the patient have difficulty seeing, even when wearing glasses/contacts?: Yes(reading glasses) Does the patient have difficulty concentrating, remembering, or making decisions?: No Does the patient have difficulty walking or climbing stairs?: No Does the patient have difficulty dressing or bathing?: No Does the patient have difficulty doing  errands alone such as visiting a doctor's office or shopping?: No   Assessment & Plan  1. Preventative health care - COMPLETE METABOLIC PANEL WITH GFR - Lipid Profile - CBC with Differential  2. Need for diphtheria-tetanus-pertussis (Tdap) vaccine - Tdap vaccine greater than or equal to 7yo IM  3. ANEMIA, IRON DEFICIENCY, HX OF - CBC with Differential  4. Need for hepatitis C screening test - Hepatitis C Antibody  5. Screening-pulmonary TB - QuantiFERON-TB Gold Plus  -USPSTF grade A and B recommendations reviewed with patient; age-appropriate recommendations, preventive care, screening tests, etc discussed and encouraged; healthy living encouraged; see AVS for patient education given to patient -Discussed importance of 150 minutes of physical activity weekly, eat two servings of fish weekly, eat one serving of tree nuts ( cashews, pistachios, pecans, almonds.Marland Kitchen) every other day, eat 6 servings of fruit/vegetables daily and drink plenty of water and avoid sweet beverages.   -Reviewed Health Maintenance: tdap

## 2019-03-20 NOTE — Addendum Note (Signed)
Addended by: Docia Furl on: 03/20/2019 12:13 PM   Modules accepted: Orders

## 2019-03-20 NOTE — Patient Instructions (Addendum)
General recommendations: 150 minutes of physical activity weekly, eat two servings of fish weekly, eat one serving of tree nuts ( cashews, pistachios, pecans, almonds.Marland Kitchen) every other day, eat 6 servings of fruit/vegetables daily and drink plenty of water and avoid sweet beverages. Recommend at least 64 ounces of water daily.   Please do call to schedule your mammogram; the number to schedule one at either [facility] or [facility] is [phone]   Stay Safe in the Sun The majority of sun exposure occurs before age 66 and skin cancer can take 20 years or more to develop. Whether your sun bathing days are behind you or you still spend time pursuing the perfect tan, you should be concerned about skin cancer.  Remember, the sun's ultraviolet (UV) rays can reflect off water, sand, concrete and snow, and can reach below the water's surface. Certain types of UV light penetrate fog and clouds, so it's possible to get sunburn even on overcast days.  Avoid direct sunlight as much as possible during the peak sun hours, generally 10 a.m. to 3 p.m., or seek shade during this part of day. Wear broad-spectrum sunscreen - with an SPF of at least 30 - containing both UVA and UVB protection. Look for ingredients like [employer] (also known as avobenzone) or titanium dioxide on the label. Reapply sunscreen frequently, at least every two hours when outdoors, especially if you perspire or you've been swimming. Your best bet is to choose water-resistant products that are more likely to stay on your skin. Wear lip balm with an SPF 15 or higher. Wear a hat and other protective clothing while in the sun. Tightly woven fibers and darker clothing generally provide more protection. Also, look for products approved by the American Academy of Dermatology. Wear UV-protective sunglasses.   Low-FODMAP Eating Plan  FODMAPs (fermentable oligosaccharides, disaccharides, monosaccharides, and polyols)  are sugars that are hard for some people to digest. A low-FODMAP eating plan may help some people who have bowel (intestinal) diseases to manage their symptoms. This meal plan can be complicated to follow. Work with a diet and nutrition specialist (dietitian) to make a low-FODMAP eating plan that is right for you. A dietitian can make sure that you get enough nutrition from this diet. What are tips for following this plan? Reading food labels  Check labels for hidden FODMAPs such as: ? High-fructose syrup. ? Honey. ? Agave. ? Natural fruit flavors. ? Onion or garlic powder.  Choose low-FODMAP foods that contain 3-4 grams of fiber per serving.  Check food labels for serving sizes. Eat only one serving at a time to make sure FODMAP levels stay low. Meal planning  Follow a low-FODMAP eating plan for up to 6 weeks, or as told by your health care provider or dietitian.  To follow the eating plan: 1. Eliminate high-FODMAP foods from your diet completely. 2. Gradually reintroduce high-FODMAP foods into your diet one at a time. Most people should wait a few days after introducing one high-FODMAP food before they introduce the next high-FODMAP food. Your dietitian can recommend how quickly you may reintroduce foods. 3. Keep a daily record of what you eat and drink, and make note of any symptoms that you have after eating. 4. Review your daily record with a dietitian regularly. Your dietitian can help you identify which foods you can eat and which foods you should avoid. General tips  Drink enough fluid each day to keep your urine pale yellow.  Avoid processed  foods. These often have added sugar and may be high in FODMAPs.  Avoid most dairy products, whole grains, and sweeteners.  Work with a dietitian to make sure you get enough fiber in your diet. Recommended foods Grains  Gluten-free grains, such as rice, oats, buckwheat, quinoa, corn, polenta, and millet. Gluten-free pasta, bread, or  cereal. Rice noodles. Corn tortillas. Vegetables  Eggplant, zucchini, cucumber, peppers, green beans, Brussels sprouts, bean sprouts, lettuce, arugula, kale, Swiss chard, spinach, collard greens, bok choy, summer squash, potato, and tomato. Limited amounts of corn, carrot, and sweet potato. Green parts of scallions. Fruits  Bananas, oranges, lemons, limes, blueberries, raspberries, strawberries, grapes, cantaloupe, honeydew melon, kiwi, papaya, passion fruit, and pineapple. Limited amounts of dried cranberries, banana chips, and shredded coconut. Dairy  Lactose-free milk, yogurt, and kefir. Lactose-free cottage cheese and ice cream. Non-dairy milks, such as almond, coconut, hemp, and rice milk. Yogurts made of non-dairy milks. Limited amounts of goat cheese, brie, mozzarella, parmesan, swiss, and other hard cheeses. Meats and other protein foods  Unseasoned beef, pork, poultry, or fish. Eggs. Berniece Salines. Tofu (firm) and tempeh. Limited amounts of nuts and seeds, such as almonds, walnuts, [place] nuts, pecans, peanuts, pumpkin seeds, chia seeds, and sunflower seeds. Fats and oils  Butter-free spreads. Vegetable oils, such as olive, canola, and sunflower oil. Seasoning and other foods  Artificial sweeteners with names that do not end in "ol" such as aspartame, saccharine, and stevia. Maple syrup, white table sugar, raw sugar, brown sugar, and molasses. Fresh basil, coriander, parsley, rosemary, and thyme. Beverages  Water and mineral water. Sugar-sweetened soft drinks. Small amounts of orange juice or cranberry juice. Black and green tea. Most dry wines. Coffee. This may not be a complete list of low-FODMAP foods. Talk with your dietitian for more information. Foods to avoid Grains  Wheat, including kamut, durum, and semolina. Barley and bulgur. Couscous. Wheat-based cereals. Wheat noodles, bread, crackers, and pastries. Vegetables  Chicory root, artichoke, asparagus, cabbage, snow peas, sugar  snap peas, mushrooms, and cauliflower. Onions, garlic, leeks, and the white part of scallions. Fruits  Fresh, dried, and juiced forms of apple, pear, watermelon, peach, plum, cherries, apricots, blackberries, boysenberries, figs, nectarines, and mango. Avocado. Dairy  Milk, yogurt, ice cream, and soft cheese. Cream and sour cream. Milk-based sauces. Custard. Meats and other protein foods  Fried or fatty meat. Sausage. Cashews and pistachios. Soybeans, baked beans, black beans, chickpeas, kidney beans, fava beans, navy beans, lentils, and split peas. Seasoning and other foods  Any sugar-free gum or candy. Foods that contain artificial sweeteners such as sorbitol, mannitol, isomalt, or xylitol. Foods that contain honey, high-fructose corn syrup, or agave. Bouillon, vegetable stock, beef stock, and chicken stock. Garlic and onion powder. Condiments made with onion, such as hummus, chutney, pickles, relish, salad dressing, and salsa. Tomato paste. Beverages  Chicory-based drinks. Coffee substitutes. Chamomile tea. Fennel tea. Sweet or fortified wines such as port or sherry. Diet soft drinks made with isomalt, mannitol, maltitol, sorbitol, or xylitol. Apple, pear, and mango juice. Juices with high-fructose corn syrup. This may not be a complete list of high-FODMAP foods. Talk with your dietitian to discuss what dietary choices are best for you.  Summary  A low-FODMAP eating plan is a short-term diet that eliminates FODMAPs from your diet to help ease symptoms of certain bowel diseases.  The eating plan usually lasts up to 6 weeks. After that, high-FODMAP foods are restarted gradually, one at a time, so you can find out which may be causing symptoms.  A low-FODMAP eating plan can be complicated. It is best to work with a dietitian who has experience with this type of plan. This information is not intended to replace advice given to you by your health care provider. Make sure you discuss any  questions you have with your health care provider. Document Released: 03/12/2017 Document Revised: 06/28/2017 Document Reviewed: 03/12/2017 Elsevier Patient Education  2020 [employer].

## 2019-03-23 LAB — CBC WITH DIFFERENTIAL/PLATELET
Absolute Monocytes: 388 cells/uL (ref 200–950)
Basophils Absolute: 63 cells/uL (ref 0–200)
Basophils Relative: 1.1 %
Eosinophils Absolute: 108 cells/uL (ref 15–500)
Eosinophils Relative: 1.9 %
HCT: 37.3 % (ref 35.0–45.0)
Hemoglobin: 12.7 g/dL (ref 11.7–15.5)
Lymphs Abs: 1596 cells/uL (ref 850–3900)
MCH: 30.1 pg (ref 27.0–33.0)
MCHC: 34 g/dL (ref 32.0–36.0)
MCV: 88.4 fL (ref 80.0–100.0)
MPV: 11 fL (ref 7.5–12.5)
Monocytes Relative: 6.8 %
Neutro Abs: 3545 cells/uL (ref 1500–7800)
Neutrophils Relative %: 62.2 %
Platelets: 193 10*3/uL (ref 140–400)
RBC: 4.22 10*6/uL (ref 3.80–5.10)
RDW: 15 % (ref 11.0–15.0)
Total Lymphocyte: 28 %
WBC: 5.7 10*3/uL (ref 3.8–10.8)

## 2019-03-23 LAB — COMPLETE METABOLIC PANEL WITH GFR
AG Ratio: 2.2 (calc) (ref 1.0–2.5)
ALT: 16 U/L (ref 6–29)
AST: 16 U/L (ref 10–35)
Albumin: 4.4 g/dL (ref 3.6–5.1)
Alkaline phosphatase (APISO): 55 U/L (ref 37–153)
BUN: 10 mg/dL (ref 7–25)
CO2: 27 mmol/L (ref 20–32)
Calcium: 9.1 mg/dL (ref 8.6–10.4)
Chloride: 106 mmol/L (ref 98–110)
Creat: 1.05 mg/dL (ref 0.50–1.05)
GFR, Est African American: 71 mL/min/{1.73_m2} (ref >=60)
GFR, Est Non African American: 61 mL/min/{1.73_m2} (ref >=60)
Globulin: 2 g/dL (calc) (ref 1.9–3.7)
Glucose, Bld: 83 mg/dL (ref 65–99)
Potassium: 3.9 mmol/L (ref 3.5–5.3)
Sodium: 143 mmol/L (ref 135–146)
Total Bilirubin: 0.4 mg/dL (ref 0.2–1.2)
Total Protein: 6.4 g/dL (ref 6.1–8.1)

## 2019-03-23 LAB — LIPID PANEL
Cholesterol: 159 mg/dL (ref <200)
HDL: 42 mg/dL — ABNORMAL LOW (ref >=50)
LDL Cholesterol (Calc): 89 mg/dL (calc)
Non-HDL Cholesterol (Calc): 117 mg/dL (calc) (ref <130)
Total CHOL/HDL Ratio: 3.8 (calc) (ref <5.0)
Triglycerides: 184 mg/dL — ABNORMAL HIGH (ref <150)

## 2019-03-23 LAB — CYTOLOGY - PAP: Diagnosis: NEGATIVE

## 2019-03-23 LAB — QUANTIFERON-TB GOLD PLUS
Mitogen-NIL: >10 IU/mL
NIL: 0.02 IU/mL
QuantiFERON-TB Gold Plus: NEGATIVE
TB1-NIL: <0 IU/mL
TB2-NIL: <0 IU/mL

## 2019-03-23 LAB — HEPATITIS C ANTIBODY
Hepatitis C Ab: NONREACTIVE
SIGNAL TO CUT-OFF: 0.01 (ref <1.00)

## 2019-04-09 ENCOUNTER — Ambulatory Visit: Payer: 59 | Admitting: Gastroenterology

## 2019-05-11 ENCOUNTER — Ambulatory Visit (INDEPENDENT_AMBULATORY_CARE_PROVIDER_SITE_OTHER): Payer: BC Managed Care – PPO | Admitting: Family Medicine

## 2019-05-11 ENCOUNTER — Other Ambulatory Visit: Payer: Self-pay

## 2019-05-11 ENCOUNTER — Encounter: Payer: Self-pay | Admitting: Family Medicine

## 2019-05-11 DIAGNOSIS — E6609 Other obesity due to excess calories: Secondary | ICD-10-CM

## 2019-05-11 DIAGNOSIS — G5603 Carpal tunnel syndrome, bilateral upper limbs: Secondary | ICD-10-CM

## 2019-05-11 DIAGNOSIS — R002 Palpitations: Secondary | ICD-10-CM

## 2019-05-11 DIAGNOSIS — K635 Polyp of colon: Secondary | ICD-10-CM

## 2019-05-11 DIAGNOSIS — M25551 Pain in right hip: Secondary | ICD-10-CM | POA: Diagnosis not present

## 2019-05-11 DIAGNOSIS — F3341 Major depressive disorder, recurrent, in partial remission: Secondary | ICD-10-CM

## 2019-05-11 DIAGNOSIS — I10 Essential (primary) hypertension: Secondary | ICD-10-CM

## 2019-05-11 DIAGNOSIS — Z8679 Personal history of other diseases of the circulatory system: Secondary | ICD-10-CM | POA: Insufficient documentation

## 2019-05-11 DIAGNOSIS — Z6834 Body mass index (BMI) 34.0-34.9, adult: Secondary | ICD-10-CM

## 2019-05-11 DIAGNOSIS — Z800 Family history of malignant neoplasm of digestive organs: Secondary | ICD-10-CM

## 2019-05-11 DIAGNOSIS — K2100 Gastro-esophageal reflux disease with esophagitis, without bleeding: Secondary | ICD-10-CM

## 2019-05-11 MED ORDER — FAMOTIDINE 20 MG PO TABS: 20.0000 mg | ORAL_TABLET | Freq: Two times a day (BID) | ORAL | 1 refills | Status: DC

## 2019-05-11 MED ORDER — NALTREXONE-BUPROPION HCL ER 8-90 MG PO TB12: ORAL_TABLET | ORAL | 1 refills | Status: DC

## 2019-05-11 NOTE — Progress Notes (Signed)
Name: Jill Cook   MRN: [id number]    DOB: 1966/07/30   Date:05/11/2019       Progress Note  Subjective  Chief Complaint  Chief Complaint  Patient presents with  . Follow-up    I connected with  Mellody Life  on 05/11/19 at  8:20 AM EDT by a video enabled telemedicine application and verified that I am speaking with the correct person using two identifiers.  I discussed the limitations of evaluation and management by telemedicine and the availability of in person appointments. The patient expressed understanding and agreed to proceed. Staff also discussed with the patient that there may be a patient responsible charge related to this service. Patient Location: Home Provider Location: Office Additional Individuals present: None  HPI  RIGHT Hip Pain: Seeing Vance Peper PA-C with [facility], has had 2 injections now and is doing a home rehab regimen.  No weakness, no numbness/tingling. Ortho suspects bursitis of the right hip.  Bilateral hand numbness/tingling: She has intermittent numbness and tingling in the bilateral wrists for about a year now.  She plays piano and organ at her church, she works PRN oncology/palliative/stroke Loss adjuster, chartered; working day shift daily Engineer, maintenance (IT). Does endorse some weakness in the hands intermittently, harder to open jars occasionally. Does not hands, but will call back if she'd like referral.  She will wear carpal tunnel braces for now.   HTN/Palpitations: BP is under good control at home 115-120's/70-80's. Denies chest pain.  She was seeing Dr. Saunders Revel. His notes are reviewed, Echo and Stress test are reviewed.  She started metoprolol and this has been working very well for her - denies shortness of breath or palpitations since starting the medication.  GERD:  No longer having issues with swallowing She has not had thyroid issues in the past, but has had GERD in the past. She does endorse some regurgitation and heartburn lately.   Discussed options for evaluation - Has GI appt coming up.  Thyroid labs were normal.  Will consider Thyroid US if GI work-up is negative. Will try Pepcid while she waits for GI appt.   History of Colon Polyps: Has colonoscopy in 2017 and had polyps, told to repeat in 3 years.  We will refer today. Maternal Aunt and two maternal cousins had colorectal cancer. No changes in BM's - no blood in stool, dark and tarry stool, mucus in stool, or constipation/diarrhea.  Does often feel like she has incomplete bowel emptying.  Has history of endometriosis and suspects this may be the cause.   Depression:  She has been taking Wellbutrin for several years, weaned herself off last year, but noticed a decrease in her mood and restarted 2 weeks ago, so she restarted the medication. Has noticed some improvement with restarting the medication.  No SI/HI. She is having some family stress right now - daughter is separating from her husband and there is an infant involved in the separation.   Office Visit from 05/11/2019 in [facility]  PHQ-9 Total Score  1     Obesity: She has struggled for several years; lost 32lbs last year, has gained much of that back.  She is back on day shift, diet is a lot better now.  She is dance exercise a few times a week along with stretching.  She is open to medication management, medical weight management.  She does not want to take metformin, but is open to injectables.  No family history of thyroid  cancer, no personal history of thyroid cancer or pancreatitis.  We will reorder naltrexone/bupropion - she has new insurance that may have better coverage.   Patient Active Problem List   Diagnosis Date Noted  . Bilateral carpal tunnel syndrome 01/08/2019  . Polyp of colon 01/08/2019  . Family history of colorectal cancer 01/08/2019  . Obese 08/23/2015  . 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 Generalized anxiety disorder 08/17/2015  . Depression  08/17/2015  . S/P right knee arthroscopy 10/06/2013  . SPLENOMEGALY 10/15/2008  . Gastroesophageal reflux disease with esophagitis 10/14/2008  . RECTAL FISSURE 04/14/2008  . DUODENITIS 04/12/2008  . IRRITABLE BOWEL SYNDROME 04/12/2008  . ANEMIA, IRON DEFICIENCY, HX OF 04/12/2008  . ESOPHAGITIS, HX OF 04/12/2008    Past Surgical History:  Procedure Laterality Date  . ANAL FISSURE REPAIR  06-13-2000  . BUNIONECTOMY Right 1991  . CESAREAN SECTION  1994  . 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 D & C HYSTEROSCOPY /  NOVASURE ENDOMETRIAL ABLATION  11-15-2005  . KNEE ARTHROSCOPY W/ MENISCECTOMY Right   . KNEE ARTHROSCOPY WITH MEDIAL MENISECTOMY Right 10/06/2013   Procedure: RIGHT KNEE ARTHROSCOPY WITH CHONDROPLASTY;  Surgeon: Sydnee Cabal, MD;  Location: [facility];  Service: Orthopedics;  Laterality: Right;  . LAPAROSCOPY N/A 11/01/2015   Procedure: LAPAROSCOPY DIAGNOSTIC;  Surgeon: Molli Posey, MD;  [facility];  Service: Gynecology;  Laterality: N/A;  . RIGHT KNEE PATELLOFEMORAL ARTHROPLASTY  06-10-2008  . SEPTOPLASTY  2000  . TONSILLECTOMY  AGE 31  . TRANSTHORACIC ECHOCARDIOGRAM  06-03-2008   NORMAL /  EF 60%  . TUBAL LIGATION  1996    Family History  Problem Relation Age of Onset  . Hypertension Mother   . Thyroid disease Mother   . Atrial fibrillation Mother   . Stroke Mother   . Heart disease Father   . Heart attack Father 38  . Hypertension Paternal Grandfather   . Diabetes Paternal Grandfather   . Uterine cancer Paternal Aunt   . Colon cancer Maternal Aunt        dx in her 15's  . Esophageal cancer Maternal Grandfather     Social History   Socioeconomic History  . Marital status: Married    Spouse name: Not on file  . Number of children: 3  . Years of education: Not  on file  . Highest education level: Not on file  Occupational History  . Occupation: Therapist, sports    Comment: Chief of Staff  Social Needs  . Financial resource strain: Not hard at all  . Food insecurity    Worry: Never true    Inability: Never true  . Transportation needs    Medical: No    Non-medical: No  Tobacco Use  . Smoking status: Never Smoker  . Smokeless tobacco: Never Used  Substance and Sexual Activity  . Alcohol use: Yes    Comment: 1 drink per month  . Drug use: No  . Sexual activity: Yes    Partners: Male  Lifestyle  . Physical activity    Days per week: 2 days    Minutes per session: 40 min  . Stress: To some extent  Relationships  . Social connections    Talks on phone: More than three times a week    Gets together: Three  times a week    Attends religious service: More than 4 times per year    Active member of club or organization: Yes    Attends meetings of clubs or organizations: 1 to 4 times per year    Relationship status: Married  . Intimate partner violence    Fear of current or ex partner: No    Emotionally abused: No    Physically abused: No    Forced sexual activity: No  Other Topics Concern  . Not on file  Social History Narrative  . Not on file     Current Outpatient Medications:  .  aspirin EC 81 MG tablet, Take 81 mg by mouth daily., Disp: , Rfl:  .  BIOTIN PO, Take by mouth., Disp: , Rfl:  .  buPROPion (WELLBUTRIN XL) 300 MG 24 hr tablet, Take 1 tablet (300 mg total) by mouth daily., Disp: 90 tablet, Rfl: 1 .  Calcium Carb-Cholecalciferol (CALCIUM-VITAMIN D) 600-400 MG-UNIT TABS, Take 1 tablet by mouth daily., Disp: , Rfl:  .  cetirizine (ZYRTEC) 10 MG tablet, Take 10 mg by mouth daily., Disp: , Rfl:  .  fluticasone (FLONASE) 50 MCG/ACT nasal spray, Place 2 sprays into both nostrils daily., Disp: 16 g, Rfl: 6 .  metoprolol succinate (TOPROL-XL) 25 MG 24 hr tablet, Take 1 tablet (25 mg total) by mouth daily., Disp: 90 tablet,  Rfl: 3 .  Naltrexone-buPROPion HCl ER 8-90 MG TB12, 1 tab QAM x7days, then 1 tab BID x7 days, then 2 tab QAM and 1 tab QPM x7 days, then 2 tab twice daily., Disp: 120 tablet, Rfl: 1  Allergies  Allergen Reactions  . Codeine Nausea And Vomiting  . Hydrocodone Nausea Only  . Hydromorphone Hcl Nausea And Vomiting  . Morphine Hives and Nausea And Vomiting    I personally reviewed active problem list, medication list, allergies, health maintenance, notes from last encounter, lab results with the patient/caregiver today.   ROS  Constitutional: Negative for fever or weight change.  Respiratory: Negative for cough and shortness of breath.   Cardiovascular: Negative for chest pain or palpitations.  Gastrointestinal: Negative for abdominal pain, no bowel changes.  Musculoskeletal: Negative for gait problem or joint swelling. See HPI regarding hip and hand pain. Skin: Negative for rash.  Neurological: Negative for dizziness or headache.  No other specific complaints in a complete review of systems (except as listed in HPI above).  Objective  Virtual encounter, vitals not obtained.  There is no height or weight on file to calculate BMI.  Physical Exam  Constitutional: Patient appears well-developed and well-nourished. No distress.  HENT: Head: Normocephalic and atraumatic.  Neck: Normal range of motion. Pulmonary/Chest: Effort normal. No respiratory distress. Speaking in complete sentences Neurological: Pt is alert and oriented to person, place, and time. Coordination, speech and are normal.  Psychiatric: Patient has a normal mood and affect. behavior is normal. Judgment and thought content normal.  No results found for this or any previous visit (from the past 72 hour(s)).  PHQ2/9: Depression screen [facility] 2/9 05/11/2019 03/20/2019 01/08/2019  Decreased Interest 0 0 0  Down, Depressed, Hopeless 0 0 1  PHQ - 2 Score 0 0 1  Altered sleeping 0 1 1  Tired, decreased energy 1 1 2   Change  in appetite 0 0 0  Feeling bad or failure about yourself  0 0 1  Trouble concentrating 0 0 0  Moving slowly or fidgety/restless 0 0 0  Suicidal thoughts 0 0 0  PHQ-9  Score 1 2 5   Difficult doing work/chores Not difficult at all Not difficult at all Not difficult at all   PHQ-2/9 Result is negative.    Fall Risk: Fall Risk  05/11/2019 03/20/2019 01/08/2019  Falls in the past year? 0 0 0  Number falls in past yr: 0 0 0  Injury with Fall? 0 0 0  Follow up Falls evaluation completed - -     Assessment & Plan  1. Right hip pain - Continue with Ortho; will call if needing second opinion or referral to PT.  2. Bilateral carpal tunnel syndrome - Will start wearing braces to see if this helps her symptoms; ice PRN; tylenol PRN  3. Palpitations - Stable and much improved on Metoprolol  4. Essential hypertension - Stable on metoprolol  5. Gastroesophageal reflux disease with esophagitis without hemorrhage - Trial of Pepcid; seeing GI upcoming - famotidine (PEPCID) 20 MG tablet; Take 1 tablet (20 mg total) by mouth 2 (two) times daily.  Dispense: 60 tablet; Refill: 1  6. Class 1 obesity due to excess calories with serious comorbidity and body mass index (BMI) of 34.0 to 34.9 in adult - Discussed importance of 150 minutes of physical activity weekly, eat two servings of fish weekly, eat one serving of tree nuts ( cashews, pistachios, pecans, almonds.Marland Kitchen) every other day, eat 6 servings of fruit/vegetables daily and drink plenty of water and avoid sweet beverages.  - Will STOP her Wellbutrin if Naltrezone-bupropion is approved. - Naltrexone-buPROPion HCl ER 8-90 MG TB12; 1 tab QAM x7days, then 1 tab BID x7 days, then 2 tab QAM and 1 tab QPM x7 days, then 2 tab twice daily.  Dispense: 120 tablet; Refill: 1  7. Family history of colorectal cancer - Seeing GI upcoming  8. Polyp of colon, unspecified part of colon, unspecified type - Seeing GI upcoming  9. Recurrent major depressive  disorder, in partial remission ([place]) - Doing well; more stress in her family right now; switched jobs which has been a welcome change; overall managing her stress levels without issue at this time.   I discussed the assessment and treatment plan with the patient. The patient was provided an opportunity to ask questions and all were answered. The patient agreed with the plan and demonstrated an understanding of the instructions.  The patient was advised to call back or seek an in-person evaluation if the symptoms worsen or if the condition fails to improve as anticipated.  I provided 23 minutes of non-face-to-face time during this encounter.

## 2019-05-21 ENCOUNTER — Encounter: Payer: Self-pay | Admitting: Gastroenterology

## 2019-05-21 ENCOUNTER — Ambulatory Visit: Payer: BC Managed Care – PPO | Admitting: Gastroenterology

## 2019-05-21 ENCOUNTER — Other Ambulatory Visit: Payer: Self-pay

## 2019-05-21 VITALS — BP 163/84 | HR 84 | Temp 98.8°F | Ht 65.75 in | Wt 219.0 lb

## 2019-05-21 DIAGNOSIS — K219 Gastro-esophageal reflux disease without esophagitis: Secondary | ICD-10-CM | POA: Diagnosis not present

## 2019-05-21 DIAGNOSIS — R1310 Dysphagia, unspecified: Secondary | ICD-10-CM

## 2019-05-21 DIAGNOSIS — Z86010 Personal history of colonic polyps: Secondary | ICD-10-CM

## 2019-05-21 DIAGNOSIS — R1319 Other dysphagia: Secondary | ICD-10-CM

## 2019-05-21 MED ORDER — BISACODYL EC 5 MG PO TBEC: DELAYED_RELEASE_TABLET | ORAL | 0 refills | Status: DC

## 2019-05-21 MED ORDER — NA SULFATE-K SULFATE-MG SULF 17.5-3.13-1.6 GM/177ML PO SOLN: 354.0000 mL | Freq: Once | ORAL | 0 refills | Status: AC

## 2019-05-21 NOTE — Addendum Note (Signed)
Addended by: Ulyess Blossom L on: 05/21/2019 03:59 PM   Modules accepted: Orders

## 2019-05-21 NOTE — Progress Notes (Signed)
Jill Cook [address]  [place], [place] [address]  Main: [phone]  Fax: [phone]   Gastroenterology Consultation  Referring Provider:     Hubbard Hartshorn, FNP Primary Care Physician:  Jill Hartshorn, FNP Reason for Consultation:    GERD, history of polyps        HPI:    Chief Complaint  Patient presents with  . New Patient (Initial Visit)  . Gastroesophageal Reflux    Just started Pepcid today   . Abdominal Pain    Located in RLQ 4-5 years the pain is sharp. States she has constipation diarrhea and nause   . Colonoscopy    is due for colonoscopy says had one 3 years ago and polyps was taken out and was advised to get one in 3 years     Jill Cook is a 53 y.o. y/o female referred for consultation & management  by Dr. Uvaldo Rising, Astrid Divine, FNP.  Patient reports daily indigestion symptoms.  Started on Pepcid today, prescribed by primary care provider.  Prior to this taking Tums every day.  No weight loss.  Reports intermittent right lower quadrant abdominal pain that improves after a bowel movement.  Usually has a bowel movement every day but sometimes it is hard and she strains, and sometimes it is very loose.  No blood in stool.  No nausea or vomiting.  Also reports very intermittent symptoms of dysphagia with a hard breads only.  Washes it down with a lot of water and it goes down.  No episodes of food impaction.  Old records reviewed and summarized as below:  Previous colonoscopy, October 2016 for screening and diarrhea with 3 diminutive polyps removed and showed tubular adenoma.  Repeat was recommended in 3 years.  Random colon biopsies were negative for microscopic colitis  Previous upper endoscopy 2010 for noncardiac chest pain showed gastritis and nothing to explain her chest pain symptoms at that time.  Biopsies did show H. Pylori  Upper GI study in 2016 showed hiatal hernia.  CT abdomen pelvis in 2016 with no concerning bowel lesions  Past Medical History:  Diagnosis Date  . Anal fissure   . Anemia   . Anxiety   . Arthritis    RIGHT HIP AND WRIST  . Colon polyps 2017  . Diverticulosis   . GERD (gastroesophageal reflux disease)    OTC PRN  . History of esophagitis   . Hypertension   . OA (osteoarthritis) of knee    RIGHT    Past Surgical History:  Procedure Laterality Date  . ANAL FISSURE REPAIR  06-13-2000  . BUNIONECTOMY Right 1991  . CESAREAN SECTION  1994  . 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 D & C HYSTEROSCOPY /  NOVASURE ENDOMETRIAL ABLATION  11-15-2005  . KNEE ARTHROSCOPY W/ MENISCECTOMY Right   . KNEE ARTHROSCOPY WITH MEDIAL MENISECTOMY Right 10/06/2013   Procedure: RIGHT KNEE ARTHROSCOPY WITH CHONDROPLASTY;  Surgeon: Sydnee Cabal, MD;  Location: [facility];  Service: Orthopedics;  Laterality: Right;  . LAPAROSCOPY N/A 11/01/2015   Procedure: LAPAROSCOPY DIAGNOSTIC;  Surgeon: Molli Posey, MD;  Location: [facility];  Service: Gynecology;  Laterality: N/A;  . RIGHT KNEE PATELLOFEMORAL ARTHROPLASTY  06-10-2008  . SEPTOPLASTY  2000  . TONSILLECTOMY  AGE 70  . TRANSTHORACIC ECHOCARDIOGRAM  06-03-2008   NORMAL /  EF 60%  . TUBAL LIGATION  1996    Prior to Admission medications   Medication Sig Start Date End Date Taking? Authorizing Provider  aspirin EC 81 MG tablet Take 81 mg by mouth daily.   Yes [provider]  BIOTIN PO Take by mouth.   Yes [provider]  buPROPion (WELLBUTRIN XL) 300 MG 24 hr tablet Take 1 tablet (300 mg total) by mouth daily. 01/08/19  Yes Jill Hartshorn, FNP  Calcium Carb-Cholecalciferol (CALCIUM-VITAMIN D) 600-400 MG-UNIT TABS Take 1 tablet by mouth daily.   Yes [provider]  cetirizine (ZYRTEC) 10 MG tablet Take 10 mg by mouth daily.   Yes [provider]  famotidine (PEPCID) 20 MG tablet Take 1 tablet (20 mg total) by mouth 2 (two) times daily. 05/11/19  Yes Jill Hartshorn, FNP  fluticasone (FLONASE) 50 MCG/ACT nasal spray Place 2 sprays into both nostrils daily. 09/22/18  Yes Hawks, Christy A, FNP  metoprolol succinate (TOPROL-XL) 25 MG 24 hr tablet Take 1 tablet (25 mg total) by mouth daily. 01/08/19  Yes Jill Hartshorn, FNP  Naltrexone-buPROPion HCl ER 8-90 MG TB12 1 tab QAM x7days, then 1 tab BID x7 days, then 2 tab QAM and 1 tab QPM x7 days, then 2 tab twice daily. 05/11/19  Yes Jill Hartshorn, FNP    Family History  Problem Relation Age of Onset  . Hypertension Mother   . Thyroid disease Mother   . Atrial fibrillation Mother   . Stroke Mother   . Heart disease Father   . Heart attack Father 62  . Hypertension Paternal Grandfather   . Diabetes Paternal Grandfather   . Uterine cancer Paternal Aunt   . Colon cancer Maternal Aunt        dx in her 38's  . Esophageal cancer Maternal Grandfather      Social History   Tobacco Use  . Smoking status: Never Smoker  . Smokeless tobacco: Never Used  Substance Use Topics  . Alcohol use: Yes    Comment: 1 drink per month  . Drug use: No    Allergies as of 05/21/2019 - Review Complete 05/21/2019  Allergen Reaction Noted  . Codeine Nausea And Vomiting 04/12/2008  . Hydrocodone Nausea Only 10/28/2015  . Hydromorphone hcl Nausea And Vomiting 04/12/2008  . Morphine Hives and Nausea And Vomiting 04/12/2008    Review of Systems:    All systems reviewed and negative except where noted in HPI.   Physical Exam:  BP (!) 163/84 (BP Location: Left Arm, Patient Position: Sitting, Cuff Size: Normal)   Pulse 84   Temp 98.8 F (37.1 C) (Oral)   Ht 5' 5.75" (1.67 m)   Wt 219 lb (99.3 kg)   BMI 35.62 kg/m  No LMP recorded. Patient has had an ablation. Psych:  Alert and cooperative. Normal mood and affect. General:   Alert,  Well-developed, well-nourished, pleasant and  cooperative in NAD Head:  Normocephalic and atraumatic. Eyes:  Sclera clear, no icterus.   Conjunctiva pink. Ears:  Normal auditory acuity. Nose:  No deformity, discharge, or lesions. Mouth:  No deformity or lesions,oropharynx pink & moist. Neck:  Supple; no masses or thyromegaly. Abdomen:  Normal bowel sounds.  No bruits.  Soft, non-tender and non-distended without masses, hepatosplenomegaly or hernias noted.  No guarding or rebound tenderness.    Msk:  Symmetrical without gross deformities. Good, equal movement & strength bilaterally. Pulses:  Normal pulses noted.  Extremities:  No clubbing or edema.  No cyanosis. Neurologic:  Alert and oriented x3;  grossly normal neurologically. Skin:  Intact without significant lesions or rashes. No jaundice. Lymph Nodes:  No significant cervical adenopathy. Psych:  Alert and cooperative. Normal mood and affect.   Labs: CBC    Component Value Date/Time   WBC 5.7 03/20/2019 1134   RBC 4.22 03/20/2019 1134   HGB 12.7 03/20/2019 1134   HCT 37.3 03/20/2019 1134   PLT 193 03/20/2019 1134   MCV 88.4 03/20/2019 1134   MCH 30.1 03/20/2019 1134   MCHC 34.0 03/20/2019 1134   RDW 15.0 03/20/2019 1134   LYMPHSABS 1,596 03/20/2019 1134   MONOABS 0.5 06/03/2017 0515   EOSABS 108 03/20/2019 1134   BASOSABS 63 03/20/2019 1134   CMP     Component Value Date/Time   NA 143 03/20/2019 1134   K 3.9 03/20/2019 1134   CL 106 03/20/2019 1134   CO2 27 03/20/2019 1134   GLUCOSE 83 03/20/2019 1134   BUN 10 03/20/2019 1134   CREATININE 1.05 03/20/2019 1134   CALCIUM 9.1 03/20/2019 1134   PROT 6.4 03/20/2019 1134   ALBUMIN 4.1 06/03/2017 0515   AST 16 03/20/2019 1134   ALT 16 03/20/2019 1134   ALKPHOS 59 06/03/2017 0515   BILITOT 0.4 03/20/2019 1134   GFRNONAA 61 03/20/2019 1134   GFRAA 71 03/20/2019 1134    Imaging Studies: Review of upper GI study reports reviewed the abdomen pelvis   IMPRESSION: 1. Small hiatal hernia. 2. Normal stomach and  duodenum. 3. Normal small bowel transit time and normal appearance of the small bowel and terminal ileum in particular.  IMPRESSION: No bowel obstruction. No abscess. No bowel wall or mesenteric thickening. No perirectal lesion is seen on this study.  Appendix appears normal.  No renal or ureteral calculus.  No hydronephrosis.  A cause for patient's symptoms has not been established with this study.  Assessment and Plan:   Keliyah Pavlovsky is a 53 y.o. y/o female has been referred for GERD, dysphagia  EGD indicated for evaluation of dysphagia We will also obtain gastric biopsies due to history of H. pylori that was treated back in 2010 Continue Pepcid once daily.  If symptoms not better with that, may need to change to PPI  Patient is also due for colon polyp surveillance  I have discussed alternative options, risks & benefits,  which include, but are not limited to, bleeding, infection, perforation,respiratory complication & drug reaction.  The patient agrees with this plan & written consent will be obtained.     Dr Jill Cook  Speech recognition software was used to dictate the above note.

## 2019-05-28 ENCOUNTER — Telehealth: Payer: Self-pay

## 2019-05-28 NOTE — Telephone Encounter (Signed)
Called patient to confirm patient procedure appointment and to remind patient that they have to go for a COVID test on 05/29/2019. Left a patient a detail message and sent patient a mychart message

## 2019-05-29 ENCOUNTER — Other Ambulatory Visit
Admission: RE | Admit: 2019-05-29 | Discharge: 2019-05-29 | Disposition: A | Discharge status: Home / Self Care | Payer: BC Managed Care – PPO | Source: Ambulatory Visit | Attending: Gastroenterology | Admitting: Gastroenterology

## 2019-05-29 ENCOUNTER — Other Ambulatory Visit: Payer: Self-pay

## 2019-05-29 DIAGNOSIS — Z01812 Encounter for preprocedural laboratory examination: Secondary | ICD-10-CM | POA: Insufficient documentation

## 2019-05-29 DIAGNOSIS — Z20828 Contact with and (suspected) exposure to other viral communicable diseases: Secondary | ICD-10-CM | POA: Insufficient documentation

## 2019-05-29 LAB — SARS CORONAVIRUS 2 (TAT 6-24 HRS): SARS Coronavirus 2: NEGATIVE

## 2019-06-03 ENCOUNTER — Ambulatory Visit: Payer: BC Managed Care – PPO | Admitting: Certified Registered Nurse Anesthetist

## 2019-06-03 ENCOUNTER — Encounter
Admission: RE | Disposition: A | Discharge status: Home / Self Care | Payer: Self-pay | Source: Home / Self Care | Attending: Gastroenterology

## 2019-06-03 ENCOUNTER — Other Ambulatory Visit: Payer: Self-pay

## 2019-06-03 ENCOUNTER — Encounter: Payer: Self-pay | Admitting: Certified Registered Nurse Anesthetist

## 2019-06-03 ENCOUNTER — Ambulatory Visit
Admission: RE | Admit: 2019-06-03 | Discharge: 2019-06-03 | Disposition: A | Discharge status: Home / Self Care | Payer: BC Managed Care – PPO | Attending: Gastroenterology | Admitting: Gastroenterology

## 2019-06-03 DIAGNOSIS — D122 Benign neoplasm of ascending colon: Secondary | ICD-10-CM | POA: Insufficient documentation

## 2019-06-03 DIAGNOSIS — Z8249 Family history of ischemic heart disease and other diseases of the circulatory system: Secondary | ICD-10-CM | POA: Insufficient documentation

## 2019-06-03 DIAGNOSIS — K228 Other specified diseases of esophagus: Secondary | ICD-10-CM | POA: Insufficient documentation

## 2019-06-03 DIAGNOSIS — Z79899 Other long term (current) drug therapy: Secondary | ICD-10-CM | POA: Diagnosis not present

## 2019-06-03 DIAGNOSIS — K219 Gastro-esophageal reflux disease without esophagitis: Secondary | ICD-10-CM

## 2019-06-03 DIAGNOSIS — I10 Essential (primary) hypertension: Secondary | ICD-10-CM | POA: Diagnosis not present

## 2019-06-03 DIAGNOSIS — K5790 Diverticulosis of intestine, part unspecified, without perforation or abscess without bleeding: Secondary | ICD-10-CM | POA: Diagnosis not present

## 2019-06-03 DIAGNOSIS — K5730 Diverticulosis of large intestine without perforation or abscess without bleeding: Secondary | ICD-10-CM | POA: Diagnosis not present

## 2019-06-03 DIAGNOSIS — E669 Obesity, unspecified: Secondary | ICD-10-CM | POA: Diagnosis not present

## 2019-06-03 DIAGNOSIS — R1310 Dysphagia, unspecified: Secondary | ICD-10-CM | POA: Insufficient documentation

## 2019-06-03 DIAGNOSIS — F419 Anxiety disorder, unspecified: Secondary | ICD-10-CM | POA: Diagnosis not present

## 2019-06-03 DIAGNOSIS — K200 Eosinophilic esophagitis: Secondary | ICD-10-CM

## 2019-06-03 DIAGNOSIS — K635 Polyp of colon: Secondary | ICD-10-CM | POA: Diagnosis not present

## 2019-06-03 DIAGNOSIS — Z7951 Long term (current) use of inhaled steroids: Secondary | ICD-10-CM | POA: Insufficient documentation

## 2019-06-03 DIAGNOSIS — Z86010 Personal history of colonic polyps: Secondary | ICD-10-CM | POA: Diagnosis not present

## 2019-06-03 DIAGNOSIS — R12 Heartburn: Secondary | ICD-10-CM | POA: Diagnosis not present

## 2019-06-03 DIAGNOSIS — K2100 Gastro-esophageal reflux disease with esophagitis, without bleeding: Secondary | ICD-10-CM

## 2019-06-03 DIAGNOSIS — Z6836 Body mass index (BMI) 36.0-36.9, adult: Secondary | ICD-10-CM | POA: Insufficient documentation

## 2019-06-03 DIAGNOSIS — Z7982 Long term (current) use of aspirin: Secondary | ICD-10-CM | POA: Diagnosis not present

## 2019-06-03 DIAGNOSIS — K3189 Other diseases of stomach and duodenum: Secondary | ICD-10-CM

## 2019-06-03 HISTORY — PX: ESOPHAGOGASTRODUODENOSCOPY (EGD) WITH PROPOFOL: SHX5813

## 2019-06-03 HISTORY — PX: COLONOSCOPY WITH PROPOFOL: SHX5780

## 2019-06-03 SURGERY — COLONOSCOPY WITH PROPOFOL
Anesthesia: General

## 2019-06-03 MED ORDER — MIDAZOLAM HCL 2 MG/2ML IJ SOLN
INTRAMUSCULAR | Status: AC
  Filled 2019-06-03: qty 2

## 2019-06-03 MED ORDER — OMEPRAZOLE 20 MG PO CPDR: 20.0000 mg | DELAYED_RELEASE_CAPSULE | Freq: Every day | ORAL | 0 refills | Status: DC

## 2019-06-03 MED ORDER — PROPOFOL 10 MG/ML IV BOLUS
INTRAVENOUS | Status: DC | PRN
  Administered 2019-06-03: 50 mg via INTRAVENOUS
  Administered 2019-06-03: 30 mg via INTRAVENOUS
  Administered 2019-06-03: 50 mg via INTRAVENOUS
  Administered 2019-06-03: 10 mg via INTRAVENOUS

## 2019-06-03 MED ORDER — LIDOCAINE HCL (CARDIAC) PF 100 MG/5ML IV SOSY
PREFILLED_SYRINGE | INTRAVENOUS | Status: DC | PRN
  Administered 2019-06-03: 50 mg via INTRAVENOUS

## 2019-06-03 MED ORDER — SODIUM CHLORIDE 0.9 % IV SOLN
INTRAVENOUS | Status: DC
  Administered 2019-06-03: 09:00:00 via INTRAVENOUS

## 2019-06-03 MED ORDER — MIDAZOLAM HCL 2 MG/2ML IJ SOLN
INTRAMUSCULAR | Status: DC | PRN
  Administered 2019-06-03: 1 mg via INTRAVENOUS

## 2019-06-03 MED ORDER — PROPOFOL 500 MG/50ML IV EMUL
INTRAVENOUS | Status: DC | PRN
  Administered 2019-06-03: 175 ug/kg/min via INTRAVENOUS

## 2019-06-03 MED ORDER — LIDOCAINE HCL (PF) 2 % IJ SOLN
INTRAMUSCULAR | Status: AC
  Filled 2019-06-03: qty 10

## 2019-06-03 MED ORDER — PROPOFOL 500 MG/50ML IV EMUL
INTRAVENOUS | Status: AC
  Filled 2019-06-03: qty 50

## 2019-06-03 MED ORDER — PROPOFOL 10 MG/ML IV BOLUS
INTRAVENOUS | Status: AC
  Filled 2019-06-03: qty 20

## 2019-06-03 NOTE — H&P (Signed)
Vonda Antigua, MD [address], [place], [place], [place], [address] [id number], [place], [place], [place], [address] Phone: [phone]  Fax: [phone]  Primary Care Physician:  Hubbard Hartshorn, FNP   Pre-Procedure History & Physical: HPI:  Jill Cook is a 53 y.o. female is here for a colonoscopy and EGD.   Past Medical History:  Diagnosis Date  . Anal fissure   . Anemia   . Anxiety   . Arthritis    RIGHT HIP AND WRIST  . Colon polyps 2017  . Diverticulosis   . GERD (gastroesophageal reflux disease)    OTC PRN  . History of esophagitis   . Hypertension   . OA (osteoarthritis) of knee    RIGHT    Past Surgical History:  Procedure Laterality Date  . ANAL FISSURE REPAIR  06-13-2000  . BUNIONECTOMY Right 1991  . CESAREAN SECTION  1994  . 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 D & C HYSTEROSCOPY /  NOVASURE ENDOMETRIAL ABLATION  11-15-2005  . KNEE ARTHROSCOPY W/ MENISCECTOMY Right   . KNEE ARTHROSCOPY WITH MEDIAL MENISECTOMY Right 10/06/2013   Procedure: RIGHT KNEE ARTHROSCOPY WITH CHONDROPLASTY;  Surgeon: Sydnee Cabal, MD;  Location[facility];  Service: Orthopedics;  Laterality: Right;  . LAPAROSCOPY N/A 11/01/2015   Procedure: LAPAROSCOPY DIAGNOSTIC;  Surgeon: Molli Posey, MD;  Location: [facility];  Service: Gynecology;  Laterality: N/A;  . RIGHT KNEE PATELLOFEMORAL ARTHROPLASTY  06-10-2008  . SEPTOPLASTY  2000  . TONSILLECTOMY  AGE 64  . TRANSTHORACIC ECHOCARDIOGRAM  06-03-2008   NORMAL /  EF 60%  . TUBAL LIGATION  1996    Prior to Admission medications   Medication Sig Start Date End Date Taking? Authorizing Provider  aspirin EC 81 MG tablet Take 81 mg by mouth daily.   Yes [provider]  BIOTIN PO Take by mouth.   Yes [provider]  buPROPion (WELLBUTRIN XL) 300 MG 24 hr tablet Take 1 tablet (300 mg total) by mouth daily. 01/08/19  Yes Hubbard Hartshorn, FNP  Calcium Carb-Cholecalciferol (CALCIUM-VITAMIN D) 600-400 MG-UNIT TABS Take 1 tablet by mouth daily.   Yes [provider]  cetirizine (ZYRTEC) 10 MG tablet Take 10 mg by mouth daily.   Yes [provider]  famotidine (PEPCID) 20 MG tablet Take 1 tablet (20 mg total) by mouth 2 (two) times daily. 05/11/19  Yes Hubbard Hartshorn, FNP  fluticasone (FLONASE) 50 MCG/ACT nasal spray Place 2 sprays into both nostrils daily. 09/22/18  Yes Hawks, Christy A, FNP  metoprolol succinate (TOPROL-XL) 25 MG 24 hr tablet Take 1 tablet (25 mg total) by mouth daily. 01/08/19  Yes Hubbard Hartshorn, FNP  bisacodyl (BISACODYL) 5 MG EC tablet Take 2 tablets (10mg ) between 1pm and 3pm 05/21/19   Virgel Manifold, MD  Naltrexone-buPROPion HCl ER 8-90 MG TB12 1 tab QAM x7days, then 1 tab BID x7 days, then 2 tab QAM and 1 tab QPM x7 days, then 2 tab twice daily. Patient not taking: Reported on 06/03/2019 05/11/19   Hubbard Hartshorn, FNP    Allergies as of 05/21/2019 - Review Complete 05/21/2019  Allergen Reaction Noted  . Codeine Nausea And Vomiting 04/12/2008  . Hydrocodone Nausea Only 10/28/2015  . Hydromorphone hcl Nausea And Vomiting 04/12/2008  . Morphine Hives and  Nausea And Vomiting 04/12/2008    Family History  Problem Relation Age of Onset  . Hypertension Mother   . Thyroid disease Mother   . Atrial fibrillation Mother   . Stroke Mother   . Heart disease Father   . Heart attack Father 66  . Hypertension Paternal Grandfather   . Diabetes Paternal Grandfather   . Uterine cancer Paternal Aunt   . Colon cancer Maternal Aunt        dx in her 12's  . Esophageal cancer Maternal Grandfather     Social History   Socioeconomic History  . Marital status: Married    Spouse name: Not on file  . Number of children: 3  . Years of education: Not on file  . Highest  education level: Not on file  Occupational History  . Occupation: Therapist, sports    Comment: Chief of Staff  Social Needs  . Financial resource strain: Not hard at all  . Food insecurity    Worry: Never true    Inability: Never true  . Transportation needs    Medical: No    Non-medical: No  Tobacco Use  . Smoking status: Never Smoker  . Smokeless tobacco: Never Used  Substance and Sexual Activity  . Alcohol use: Yes    Comment: 1 drink per month  . Drug use: No  . Sexual activity: Yes    Partners: Male  Lifestyle  . Physical activity    Days per week: 2 days    Minutes per session: 40 min  . Stress: To some extent  Relationships  . Social connections    Talks on phone: More than three times a week    Gets together: Three times a week    Attends religious service: More than 4 times per year    Active member of club or organization: Yes    Attends meetings of clubs or organizations: 1 to 4 times per year    Relationship status: Married  . Intimate partner violence    Fear of current or ex partner: No    Emotionally abused: No    Physically abused: No    Forced sexual activity: No  Other Topics Concern  . Not on file  Social History Narrative  . Not on file    Review of Systems: See HPI, otherwise negative ROS  Physical Exam: BP 127/84   Pulse 80   Temp 98.1 F (36.7 C) (Temporal)   Resp 16   Ht 5\' 5"  (1.651 m)   Wt 99.3 kg   SpO2 98%   BMI 36.44 kg/m  General:   Alert,  pleasant and cooperative in NAD Head:  Normocephalic and atraumatic. Neck:  Supple; no masses or thyromegaly. Lungs:  Clear throughout to auscultation, normal respiratory effort.    Heart:  +S1, +S2, Regular rate and rhythm, No edema. Abdomen:  Soft, nontender and nondistended. Normal bowel sounds, without guarding, and without rebound.   Neurologic:  Alert and  oriented x4;  grossly normal neurologically.  Impression/Plan: Jill Cook is here for a colonoscopy to be  performed for polyp surveillance and EGD for Acid Reflux and dysphagia.  Risks, benefits, limitations, and alternatives regarding the procedures have been reviewed with the patient.  Questions have been answered.  All parties agreeable.   Virgel Manifold, MD  06/03/2019, 9:06 AM

## 2019-06-03 NOTE — Op Note (Signed)
[facility] Patient Name: Jill Cook Procedure Date: 06/03/2019 9:09 AM MRN: [id number] Account #: [id number] Date of Birth: 09/02/1965 Admit Type: Outpatient Age: 53 Room: [facility] ENDO ROOM 4 Gender: Female Note Status: Finalized Procedure:             Upper GI endoscopy Indications:           Dysphagia, Heartburn Providers:             Angenette Daily B. Bonna Gains MD, MD Referring MD:          Forest Gleason Md, MD (Referring MD) Medicines:             Monitored Anesthesia Care Complications:         No immediate complications. Procedure:             Pre-Anesthesia Assessment:                        - Prior to the procedure, a History and Physical was                         performed, and patient medications, allergies and                         sensitivities were reviewed. The patient's tolerance                         of previous anesthesia was reviewed.                        - The risks and benefits of the procedure and the                         sedation options and risks were discussed with the                         patient. All questions were answered and informed                         consent was obtained.                        - Patient identification and proposed procedure were                         verified prior to the procedure by the physician, the                         nurse, the anesthesiologist, the anesthetist and the                         technician. The procedure was verified in the                         procedure room.                        - ASA Grade Assessment: II - A patient with mild                         systemic disease.  After obtaining informed consent, the endoscope was                         passed under direct vision. Throughout the procedure,                         the patient's blood pressure, pulse, and oxygen                         saturations were monitored continuously.  The Endoscope                         was introduced through the mouth, and advanced to the                         second part of duodenum. The upper GI endoscopy was                         accomplished with ease. The patient tolerated the                         procedure well. Findings:      LA Grade A (one or more mucosal breaks less than 5 mm, not extending       between tops of 2 mucosal folds) esophagitis with no bleeding was found       at the gastroesophageal junction.      Mucosal changes including circumferential folds were found in the entire       esophagus. Biopsies were obtained from the proximal and distal esophagus       with cold forceps for histology of suspected eosinophilic esophagitis.      Patchy mildly erythematous mucosa without bleeding was found in the       gastric antrum. Biopsies were taken with a cold forceps for histology.       Biopsies were obtained in the gastric body, at the incisura and in the       gastric antrum with cold forceps for histology.      The duodenal bulb, second portion of the duodenum and examined duodenum       were normal. Impression:            - LA Grade A reflux esophagitis with no bleeding.                        - Esophageal mucosal changes suggestive of                         eosinophilic esophagitis. Biopsied.                        - Erythematous mucosa in the antrum. Biopsied.                        - Normal duodenal bulb, second portion of the duodenum                         and examined duodenum.                        - Biopsies were obtained in the  gastric body, at the                         incisura and in the gastric antrum. Recommendation:        - Await pathology results.                        - Discharge patient to home (with escort).                        - Advance diet as tolerated.                        - Continue present medications.                        - Patient has a contact number available for                          emergencies. The signs and symptoms of potential                         delayed complications were discussed with the patient.                         Return to normal activities tomorrow. Written                         discharge instructions were provided to the patient.                        - Discharge patient to home (with escort).                        - The findings and recommendations were discussed with                         the patient.                        - The findings and recommendations were discussed with                         the patient's family.                        - Follow an antireflux regimen.                        - Take prescribed proton pump inhibitor or H2 blocker                         (antacid) medications 30 - 60 minutes before meals. Procedure Code(s):     --- Professional ---                        [phone], Esophagogastroduodenoscopy, flexible,                         transoral; with biopsy, single or multiple Diagnosis Code(s):     --- Professional ---  K21.00, Gastro-esophageal reflux disease with                         esophagitis, without bleeding                        K22.8, Other specified diseases of esophagus                        K31.89, Other diseases of stomach and duodenum                        R13.10, Dysphagia, unspecified                        R12, Heartburn CPT copyright 2019 American Medical Association. All rights reserved. The codes documented in this report are preliminary and upon coder review may  be revised to meet current compliance requirements.  Vonda Antigua, MD Margretta Sidle B. Bonna Gains MD, MD 06/03/2019 9:27:00 AM This report has been signed electronically. Number of Addenda: 0 Note Initiated On: 06/03/2019 9:09 AM Estimated Blood Loss:  Estimated blood loss: none.      [facility]

## 2019-06-03 NOTE — Anesthesia Postprocedure Evaluation (Signed)
Anesthesia Post Note  Patient: Jill Cook  Procedure(s) Performed: COLONOSCOPY WITH PROPOFOL (N/A ) ESOPHAGOGASTRODUODENOSCOPY (EGD) WITH PROPOFOL (N/A )  Patient location during evaluation: PACU Anesthesia Type: General Level of consciousness: awake and alert and oriented Pain management: pain level controlled Vital Signs Assessment: post-procedure vital signs reviewed and stable Respiratory status: spontaneous breathing Cardiovascular status: blood pressure returned to baseline Anesthetic complications: no     Last Vitals:  Vitals:   06/03/19 1007 06/03/19 1037  BP: 124/76 140/81  Pulse:    Resp: 19   Temp: (!) 36.3 C   SpO2: 98%     Last Pain:  Vitals:   06/03/19 1037  TempSrc:   PainSc: 0-No pain                 [name]

## 2019-06-03 NOTE — Anesthesia Procedure Notes (Signed)
Date/Time: 06/03/2019 9:07 AM Performed by: Johnna Acosta, CRNA Pre-anesthesia Checklist: Emergency Drugs available, Patient identified, Suction available, Patient being monitored and Timeout performed Patient Re-evaluated:Patient Re-evaluated prior to induction Oxygen Delivery Method: Nasal cannula Preoxygenation: Pre-oxygenation with 100% oxygen Induction Type: IV induction

## 2019-06-03 NOTE — Anesthesia Preprocedure Evaluation (Signed)
Anesthesia Evaluation  Patient identified by MRN, date of birth, ID band Patient awake    Reviewed: Allergy & Precautions, H&P , NPO status , Patient's Chart, lab work & pertinent test results  Airway Mallampati: III   Neck ROM: Full    Dental  (+) Teeth Intact, Dental Advisory Given   Pulmonary neg pulmonary ROS,    breath sounds clear to auscultation       Cardiovascular hypertension, Pt. on medications  Rhythm:Regular  ECHO 2009 EF 60% normal valves   Neuro/Psych PSYCHIATRIC DISORDERS Anxiety Depression negative neurological ROS     GI/Hepatic negative GI ROS, Neg liver ROS, GERD  Medicated,  Endo/Other  negative endocrine ROS  Renal/GU negative Renal ROS   Pelvic pain    Musculoskeletal  (+) Arthritis , Osteoarthritis,    Abdominal (+)  Abdomen: soft.    Peds negative pediatric ROS (+)  Hematology  (+) Blood dyscrasia, anemia , 11/35, plts, 166, INR 1.12   Anesthesia Other Findings Past Medical History: No date: Anal fissure No date: Anemia No date: Anxiety No date: Arthritis     Comment:  RIGHT HIP AND WRIST 2017: Colon polyps No date: Diverticulosis No date: GERD (gastroesophageal reflux disease)     Comment:  OTC PRN No date: History of esophagitis No date: Hypertension No date: OA (osteoarthritis) of knee     Comment:  RIGHT  Reproductive/Obstetrics negative OB ROS                             Anesthesia Physical  Anesthesia Plan  ASA: II  Anesthesia Plan: General   Post-op Pain Management:    Induction: Intravenous  PONV Risk Score and Plan: Propofol infusion  Airway Management Planned: Nasal Cannula  Additional Equipment:   Intra-op Plan:   Post-operative Plan:   Informed Consent: I have reviewed the patients History and Physical, chart, labs and discussed the procedure including the risks, benefits and alternatives for the proposed anesthesia with  the patient or authorized representative who has indicated his/her understanding and acceptance.       Plan Discussed with: CRNA and Surgeon  Anesthesia Plan Comments:         Anesthesia Quick Evaluation

## 2019-06-03 NOTE — Transfer of Care (Signed)
Immediate Anesthesia Transfer of Care Note  Patient: Jill Cook  Procedure(s) Performed: COLONOSCOPY WITH PROPOFOL (N/A ) ESOPHAGOGASTRODUODENOSCOPY (EGD) WITH PROPOFOL (N/A )  Patient Location: PACU  Anesthesia Type:General  Level of Consciousness: sedated  Airway & Oxygen Therapy: Patient Spontanous Breathing  Post-op Assessment: Report given to RN and Post -op Vital signs reviewed and stable  Post vital signs: Reviewed and stable  Last Vitals:  Vitals Value Taken Time  BP 124/76 06/03/19 1008  Temp 36.3 C 06/03/19 1007  Pulse 74 06/03/19 1008  Resp 16 06/03/19 1008  SpO2 98 % 06/03/19 1008  Vitals shown include unvalidated device data.  Last Pain:  Vitals:   06/03/19 1007  TempSrc: Tympanic  PainSc: Asleep         Complications: No apparent anesthesia complications

## 2019-06-03 NOTE — Op Note (Signed)
[facility]astroenter[facility] Patient Name: Jill Cook Procedure Date: 06/03/2019 9:08 AM MRN: [id number] Account #: [id number] Date of Birth: 05-May-1966 Admit Type: Outpatient Age: 53 Room: [facility] ENDO ROOM 4 Gender: Female Note Status: Finalized Procedure:             Colonoscopy Indications:           High risk colon cancer surveillance: Personal history                         of colonic polyps, Incidental - Diarrhea Providers:             Ryler Laskowski B. Bonna Gains MD, MD Referring MD:          Forest Gleason Md, MD (Referring MD) Medicines:             Monitored Anesthesia Care Complications:         No immediate complications. Procedure:             Pre-Anesthesia Assessment:                        - ASA Grade Assessment: II - A patient with mild                         systemic disease.                        - Prior to the procedure, a History and Physical was                         performed, and patient medications, allergies and                         sensitivities were reviewed. The patient's tolerance                         of previous anesthesia was reviewed.                        - The risks and benefits of the procedure and the                         sedation options and risks were discussed with the                         patient. All questions were answered and informed                         consent was obtained.                        - Patient identification and proposed procedure were                         verified prior to the procedure by the physician, the                         nurse, the anesthesiologist, the anesthetist and the  technician. The procedure was verified in the                         procedure room.                        After obtaining informed consent, the colonoscope was                         passed under direct vision. Throughout the procedure,                         the patient's blood  pressure, pulse, and oxygen                         saturations were monitored continuously. The                         Colonoscope was introduced through the anus and                         advanced to the the cecum, identified by appendiceal                         orifice and ileocecal valve. The colonoscopy was                         performed with ease. The patient tolerated the                         procedure well. The quality of the bowel preparation                         was good. Findings:      The perianal and digital rectal examinations were normal.      Two sessile polyps were found in the ascending colon. The polyps were 3       to 4 mm in size. These polyps were removed with a cold biopsy forceps.       Resection and retrieval were complete.      A few diverticula were found in the sigmoid colon.      The exam was otherwise without abnormality.      The rectum, sigmoid colon, descending colon, transverse colon, ascending       colon and cecum appeared normal. Biopsies for histology were taken with       a cold forceps from the entire colon for evaluation of microscopic       colitis.      The retroflexed view of the distal rectum and anal verge was normal and       showed no anal or rectal abnormalities. Impression:            - Two 3 to 4 mm polyps in the ascending colon, removed                         with a cold biopsy forceps. Resected and retrieved.                        - Diverticulosis in the sigmoid colon.                        -  The examination was otherwise normal.                        - The rectum, sigmoid colon, descending colon,                         transverse colon, ascending colon and cecum are                         normal. Biopsied.                        - The distal rectum and anal verge are normal on                         retroflexion view. Recommendation:        - Discharge patient to home (with escort).                        - High  fiber diet.                        - Advance diet as tolerated.                        - Continue present medications.                        - Await pathology results.                        - Repeat colonoscopy date to be determined after                         pending pathology results are reviewed.                        - The findings and recommendations were discussed with                         the patient.                        - The findings and recommendations were discussed with                         the patient's family.                        - Return to primary care physician as previously                         scheduled. Procedure Code(s):     --- Professional ---                        [phone], Colonoscopy, flexible; with biopsy, single or                         multiple Diagnosis Code(s):     --- Professional ---  Z86.010, Personal history of colonic polyps                        K63.5, Polyp of colon CPT copyright 2019 American Medical Association. All rights reserved. The codes documented in this report are preliminary and upon coder review may  be revised to meet current compliance requirements.  Vonda Antigua, MD Margretta Sidle B. Bonna Gains MD, MD 06/03/2019 10:12:03 AM This report has been signed electronically. Number of Addenda: 0 Note Initiated On: 06/03/2019 9:08 AM Scope Withdrawal Time: 0 hours 22 minutes 35 seconds  Total Procedure Duration: 0 hours 32 minutes 45 seconds  Estimated Blood Loss:  Estimated blood loss: none.      [facility]

## 2019-06-03 NOTE — Anesthesia Post-op Follow-up Note (Signed)
Anesthesia QCDR form completed.        

## 2019-06-04 ENCOUNTER — Telehealth: Payer: Self-pay

## 2019-06-04 ENCOUNTER — Encounter: Payer: Self-pay | Admitting: Gastroenterology

## 2019-06-04 LAB — SURGICAL PATHOLOGY

## 2019-06-04 NOTE — Telephone Encounter (Signed)
-----   Message from Virgel Manifold, MD sent at 06/04/2019  3:41 PM EST ----- Caryl Pina please let the patient know, her biopsies did not show any infection with H. pylori.  If she is still having reflux symptoms despite the omeprazole 20 mg daily that she is on, please increase it to omeprazole 40 mg once daily for 30 days.  If she is not taking this medication at all, she should start taking omeprazole 20 mg once daily 30 minutes before breakfast  Her colon polyps were benign but precancerous and were removed.  Next colonoscopy should be in 5 years, please set recall

## 2019-06-04 NOTE — Telephone Encounter (Signed)
Patient verbalized understanding. Patient states she feels like the omeprazole 20mg  is helping her GERD symptoms. Put in recall for 5 years

## 2019-07-14 ENCOUNTER — Telehealth: Payer: BC Managed Care – PPO | Admitting: Physician Assistant

## 2019-07-14 DIAGNOSIS — J019 Acute sinusitis, unspecified: Secondary | ICD-10-CM | POA: Diagnosis not present

## 2019-07-14 MED ORDER — AMOXICILLIN-POT CLAVULANATE 875-125 MG PO TABS: 1.0000 | ORAL_TABLET | Freq: Two times a day (BID) | ORAL | 0 refills | Status: DC

## 2019-07-14 MED ORDER — FLUTICASONE PROPIONATE 50 MCG/ACT NA SUSP: 2.0000 | Freq: Every day | NASAL | 0 refills | Status: DC

## 2019-07-14 NOTE — Progress Notes (Signed)
We are sorry that you are not feeling well.  Here is how we plan to help!  Based on what you have shared with me it looks like you have sinusitis.  Sinusitis is inflammation and infection in the sinus cavities of the head.  Based on your presentation I believe you most likely have Acute Bacterial Sinusitis.  This would be the cause of your ear fullness, pain and muffled hearing.  This is an infection caused by bacteria and is treated with antibiotics. I have prescribed Augmentin 875mg /125mg  one tablet twice daily with food, for 7 days. You may use an oral decongestant such as Mucinex D or if you have glaucoma or high blood pressure use plain Mucinex. Saline nasal spray help and can safely be used as often as needed for congestion.  If you develop worsening sinus pain, fever or notice severe headache and vision changes, or if symptoms are not better after completion of antibiotic, please schedule an appointment with a health care provider.    I am also going to prescribe Flonase which will help with the drainage of your ears.  Sinus infections are not as easily transmitted as other respiratory infection, however we still recommend that you avoid close contact with loved ones, especially the very young and elderly.  Remember to wash your hands thoroughly throughout the day as this is the number one way to prevent the spread of infection!  Home Care:  Only take medications as instructed by your medical team.  Complete the entire course of an antibiotic.  Do not take these medications with alcohol.  A steam or ultrasonic humidifier can help congestion.  You can place a towel over your head and breathe in the steam from hot water coming from a faucet.  Avoid close contacts especially the very young and the elderly.  Cover your mouth when you cough or sneeze.  Always remember to wash your hands.  Get Help Right Away If:  You develop worsening fever or sinus pain.  You develop a severe head ache  or visual changes.  Your symptoms persist after you have completed your treatment plan.  Make sure you  Understand these instructions.  Will watch your condition.  Will get help right away if you are not doing well or get worse.  Your e-visit answers were reviewed by a board certified advanced clinical practitioner to complete your personal care plan.  Depending on the condition, your plan could have included both over the counter or prescription medications.  If there is a problem please reply  once you have received a response from your provider.  Your safety is important to [place].  If you have drug allergies check your prescription carefully.    You can use MyChart to ask questions about today's visit, request a non-urgent call back, or ask for a work or school excuse for 24 hours related to this e-Visit. If it has been greater than 24 hours you will need to follow up with your provider, or enter a new e-Visit to address those concerns.  You will get an e-mail in the next two days asking about your experience.  I hope that your e-visit has been valuable and will speed your recovery. Thank you for using e-visits.  Greater than 5 minutes, yet less than 10 minutes of time have been spent researching, coordinating, and implementing care for this patient today

## 2019-08-20 ENCOUNTER — Other Ambulatory Visit: Payer: Self-pay | Admitting: Gastroenterology

## 2019-12-05 ENCOUNTER — Ambulatory Visit: Payer: BC Managed Care – PPO | Attending: Internal Medicine

## 2019-12-05 DIAGNOSIS — Z23 Encounter for immunization: Secondary | ICD-10-CM

## 2019-12-05 NOTE — Progress Notes (Signed)
   Covid-19 Vaccination Clinic  Name:  Jill Cook    MRN: [id number] DOB: 1966/01/30  12/05/2019  [name] was observed post Covid-19 immunization for 15 minutes without incident. She was provided with Vaccine Information Sheet and instruction to access the V-Safe system.   [name] was instructed to call 911 with any severe reactions post vaccine: Marland Kitchen Difficulty breathing  . Swelling of face and throat  . A fast heartbeat  . A bad rash all over body  . Dizziness and weakness   Immunizations Administered    Name Date Dose VIS Date Route   Pfizer COVID-19 Vaccine 12/05/2019 10:45 AM 0.3 mL 09/23/2018 Intramuscular   Manufacturer: [employer], [employer]   Lot: [id number]   [place]: [id number]

## 2019-12-21 ENCOUNTER — Ambulatory Visit: Payer: BC Managed Care – PPO | Admitting: Family Medicine

## 2020-01-05 ENCOUNTER — Ambulatory Visit: Payer: BC Managed Care – PPO

## 2020-03-11 ENCOUNTER — Telehealth: Payer: Self-pay

## 2020-03-11 NOTE — Telephone Encounter (Signed)
Pt needs appt for refill  

## 2020-03-14 NOTE — Telephone Encounter (Signed)
lvm to schedule appt for med refills. Raquel Sarna gone

## 2020-04-15 ENCOUNTER — Other Ambulatory Visit: Payer: Self-pay

## 2020-04-15 ENCOUNTER — Ambulatory Visit: Payer: BC Managed Care – PPO | Admitting: Family Medicine

## 2020-04-15 ENCOUNTER — Other Ambulatory Visit: Payer: Self-pay | Admitting: Family Medicine

## 2020-04-15 ENCOUNTER — Encounter: Payer: Self-pay | Admitting: Family Medicine

## 2020-04-15 VITALS — BP 130/72 | HR 113 | Temp 98.4°F | Resp 16 | Ht 65.0 in | Wt 202.8 lb

## 2020-04-15 DIAGNOSIS — F341 Dysthymic disorder: Secondary | ICD-10-CM

## 2020-04-15 DIAGNOSIS — Z1231 Encounter for screening mammogram for malignant neoplasm of breast: Secondary | ICD-10-CM

## 2020-04-15 DIAGNOSIS — F419 Anxiety disorder, unspecified: Secondary | ICD-10-CM | POA: Diagnosis not present

## 2020-04-15 DIAGNOSIS — R002 Palpitations: Secondary | ICD-10-CM

## 2020-04-15 DIAGNOSIS — Z23 Encounter for immunization: Secondary | ICD-10-CM | POA: Diagnosis not present

## 2020-04-15 DIAGNOSIS — I10 Essential (primary) hypertension: Secondary | ICD-10-CM | POA: Diagnosis not present

## 2020-04-15 DIAGNOSIS — K582 Mixed irritable bowel syndrome: Secondary | ICD-10-CM

## 2020-04-15 DIAGNOSIS — E669 Obesity, unspecified: Secondary | ICD-10-CM

## 2020-04-15 DIAGNOSIS — Z8742 Personal history of other diseases of the female genital tract: Secondary | ICD-10-CM

## 2020-04-15 DIAGNOSIS — F410 Panic disorder [episodic paroxysmal anxiety] without agoraphobia: Secondary | ICD-10-CM

## 2020-04-15 DIAGNOSIS — K2100 Gastro-esophageal reflux disease with esophagitis, without bleeding: Secondary | ICD-10-CM

## 2020-04-15 MED ORDER — ALPRAZOLAM 0.5 MG PO TABS: 0.5000 mg | ORAL_TABLET | Freq: Every evening | ORAL | 0 refills | Status: DC | PRN

## 2020-04-15 MED ORDER — HYDROXYZINE HCL 10 MG PO TABS: 10.0000 mg | ORAL_TABLET | Freq: Three times a day (TID) | ORAL | 0 refills | Status: DC | PRN

## 2020-04-15 MED ORDER — METOPROLOL SUCCINATE ER 25 MG PO TB24: 25.0000 mg | ORAL_TABLET | Freq: Every day | ORAL | 3 refills | Status: DC

## 2020-04-15 MED ORDER — BUPROPION HCL ER (XL) 300 MG PO TB24: 300.0000 mg | ORAL_TABLET | Freq: Every day | ORAL | 1 refills | Status: DC

## 2020-04-15 MED ORDER — FAMOTIDINE 20 MG PO TABS: 20.0000 mg | ORAL_TABLET | Freq: Every day | ORAL | 1 refills | Status: DC

## 2020-04-15 MED ORDER — HYOSCYAMINE SULFATE 0.125 MG PO TABS: 0.1250 mg | ORAL_TABLET | ORAL | 0 refills | Status: DC | PRN

## 2020-04-15 NOTE — Progress Notes (Signed)
Name: Jill Cook   MRN: [id number]    DOB: 1966-05-28   Date:04/15/2020       Progress Note  Subjective  Chief Complaint  Chief Complaint  Patient presents with  . Medication Refill    She needs refills on her Pepcid.    HPI  GERD: she was on Omeprazole, but was given Pepcid and it has worked well for her. She get hoarse when she does not take medication. 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 She will resume medication when her schedule improves She denies associated palpitation with medication  Depression/Anxiety: she states very seldom has panic attacks, she gets tearful when off Wellbutrin, but is doing well on medication. Denies suicidal thoughts or ideation  HTN : on metoprolol for palpitation and bp and bp is at goal, no sob   IBS: she has episodes of constipation and diarrhea, she has abdominal cramping, worse when stressed and improves with bowel movements. She never took medications for it, we will try Levsin  Atypical mole: on her right mid back, it has changed in size and texture and she recently scratched it off, she will return for a shave biopsy    Patient Active Problem List   Diagnosis Date Noted  . History of endometriosis 04/15/2020  . Essential hypertension 05/11/2019  . Palpitations 05/11/2019  . Bilateral carpal tunnel syndrome 01/08/2019  . Polyp of colon 01/08/2019  . Family history of colorectal cancer 01/08/2019  . Obese 08/23/2015  . 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 Generalized anxiety disorder 08/17/2015  . Depression 08/17/2015  . Gastroesophageal reflux disease with esophagitis without hemorrhage 10/14/2008  . Irritable bowel syndrome 04/12/2008    Past Surgical History:  Procedure Laterality Date  . ANAL FISSURE REPAIR  06-13-2000  .  BUNIONECTOMY Right 1991  . CESAREAN SECTION  1994  . COLONOSCOPY WITH PROPOFOL N/A 06/03/2019   Procedure: COLONOSCOPY WITH PROPOFOL;  Surgeon: Virgel Manifold, MD;  Location: [facility];  Service: Endoscopy;  Laterality: N/A;  . 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 D & C HYSTEROSCOPY /  NOVASURE ENDOMETRIAL ABLATION  11-15-2005  . ESOPHAGOGASTRODUODENOSCOPY (EGD) WITH PROPOFOL N/A 06/03/2019   Procedure: ESOPHAGOGASTRODUODENOSCOPY (EGD) WITH PROPOFOL;  Surgeon: Virgel Manifold, MD;  Location: [facility];  Service: Endoscopy;  Laterality: N/A;  . KNEE ARTHROSCOPY W/ MENISCECTOMY Right   . KNEE ARTHROSCOPY WITH MEDIAL MENISECTOMY Right 10/06/2013   Procedure: RIGHT KNEE ARTHROSCOPY WITH CHONDROPLASTY;  Surgeon: Sydnee Cabal, MD;  Location[facility];  Service: Orthopedics;  Laterality: Right;  . LAPAROSCOPY N/A 11/01/2015   Procedure: LAPAROSCOPY DIAGNOSTIC;  Surgeon: Molli Posey, MD;  Location: [facility];  Service: Gynecology;  Laterality: N/A;  . RIGHT KNEE PATELLOFEMORAL ARTHROPLASTY  06-10-2008  . SEPTOPLASTY  2000  . TONSILLECTOMY  AGE 66  . TRANSTHORACIC ECHOCARDIOGRAM  06-03-2008   NORMAL /  EF 60%  . TUBAL LIGATION  1996    Family History  Problem Relation Age of Onset  . Hypertension Mother   . Thyroid disease Mother   . Atrial fibrillation Mother   . Stroke Mother   .  Heart disease Father   . Heart attack Father 42  . Hypertension Paternal Grandfather   . Diabetes Paternal Grandfather   . Uterine cancer Paternal Aunt   . Colon cancer Maternal Aunt        dx in her 23's  . Esophageal cancer Maternal Grandfather     Social History   Tobacco Use  . Smoking status: Never Smoker  . Smokeless tobacco: Never Used  Substance Use Topics  . Alcohol use: Yes    Comment:  1 drink per month     Current Outpatient Medications:  .  aspirin EC 81 MG tablet, Take 81 mg by mouth daily., Disp: , Rfl:  .  BIOTIN PO, Take by mouth., Disp: , Rfl:  .  buPROPion (WELLBUTRIN XL) 300 MG 24 hr tablet, Take 1 tablet (300 mg total) by mouth daily., Disp: 90 tablet, Rfl: 1 .  Calcium Carb-Cholecalciferol (CALCIUM-VITAMIN D) 600-400 MG-UNIT TABS, Take 1 tablet by mouth daily., Disp: , Rfl:  .  cetirizine (ZYRTEC) 10 MG tablet, Take 10 mg by mouth daily., Disp: , Rfl:  .  famotidine (PEPCID) 20 MG tablet, Take 1 tablet (20 mg total) by mouth daily., Disp: 90 tablet, Rfl: 1 .  fluticasone (FLONASE) 50 MCG/ACT nasal spray, Place 2 sprays into both nostrils daily., Disp: 9.9 g, Rfl: 0 .  metoprolol succinate (TOPROL-XL) 25 MG 24 hr tablet, Take 1 tablet (25 mg total) by mouth daily., Disp: 90 tablet, Rfl: 3 .  phentermine (ADIPEX-P) 37.5 MG tablet, Take 37.5 mg by mouth at bedtime., Disp: , Rfl:  .  ALPRAZolam (XANAX) 0.5 MG tablet, Take 1 tablet (0.5 mg total) by mouth at bedtime as needed for anxiety., Disp: 5 tablet, Rfl: 0 .  hydrOXYzine (ATARAX/VISTARIL) 10 MG tablet, Take 1 tablet (10 mg total) by mouth 3 (three) times daily as needed., Disp: 30 tablet, Rfl: 0 .  hyoscyamine (LEVSIN) 0.125 MG tablet, Take 1 tablet (0.125 mg total) by mouth every 4 (four) hours as needed., Disp: 90 tablet, Rfl: 0  Allergies  Allergen Reactions  . Codeine Nausea And Vomiting  . Hydrocodone Nausea Only  . Hydromorphone Hcl Nausea And Vomiting  . Morphine Hives and Nausea And Vomiting    I personally reviewed active problem list, medication list, allergies, family history, social history, health maintenance with the patient/caregiver today.   ROS  Constitutional: Negative for fever or weight change.  Respiratory: Negative for cough and shortness of breath.   Cardiovascular: Negative for chest pain or palpitations.  Gastrointestinal: Negative for abdominal pain, no bowel changes.   Musculoskeletal: Negative for gait problem or joint swelling.  Skin: Negative for rash.  Neurological: Negative for dizziness or headache.  No other specific complaints in a complete review of systems (except as listed in HPI above).  Objective  Vitals:   04/15/20 1558  BP: 130/72  Pulse: (!) 113  Resp: 16  Temp: 98.4 F (36.9 C)  TempSrc: Oral  SpO2: 99%  Weight: 202 lb 12.8 oz (92 kg)  Height: 5\' 5"  (1.651 m)    Body mass index is 33.75 kg/m.  Physical Exam  Constitutional: Patient appears well-developed and well-nourished. Obese  No distress.  HEENT: head atraumatic, normocephalic, pupils equal and reactive to light,  neck supple, throat within normal limits Cardiovascular: Normal rate, regular rhythm and normal heart sounds.  No murmur heard. No BLE edema. Pulmonary/Chest: Effort normal and breath sounds normal. No respiratory distress. Abdominal: Soft.  There is no tenderness. Psychiatric: Patient has  a normal mood and affect. behavior is normal. Judgment and thought content normal.  PHQ2/9: Depression screen [facility] 2/9 04/15/2020 05/11/2019 03/20/2019 01/08/2019  Decreased Interest 0 0 0 0  Down, Depressed, Hopeless 0 0 0 1  PHQ - 2 Score 0 0 0 1  Altered sleeping - 0 1 1  Tired, decreased energy - 1 1 2   Change in appetite - 0 0 0  Feeling bad or failure about yourself  - 0 0 1  Trouble concentrating - 0 0 0  Moving slowly or fidgety/restless - 0 0 0  Suicidal thoughts - 0 0 0  PHQ-9 Score - 1 2 5   Difficult doing work/chores - Not difficult at all Not difficult at all Not difficult at all    phq 9 is negative   Fall Risk: Fall Risk  04/15/2020 05/11/2019 03/20/2019 01/08/2019  Falls in the past year? 0 0 0 0  Number falls in past yr: 0 0 0 0  Injury with Fall? 0 0 0 0  Follow up - Falls evaluation completed - -     Functional Status Survey: Is the patient deaf or have difficulty hearing?: No Does the patient have difficulty seeing, even when wearing  glasses/contacts?: No Does the patient have difficulty concentrating, remembering, or making decisions?: No Does the patient have difficulty walking or climbing stairs?: No Does the patient have difficulty dressing or bathing?: No Does the patient have difficulty doing errands alone such as visiting a doctor's office or shopping?: No    Assessment & Plan  1. Essential hypertension   2. Dysthymia  - buPROPion (WELLBUTRIN XL) 300 MG 24 hr tablet; Take 1 tablet (300 mg total) by mouth daily.  Dispense: 90 tablet; Refill: 1  3. Anxiety  - buPROPion (WELLBUTRIN XL) 300 MG 24 hr tablet; Take 1 tablet (300 mg total) by mouth daily.  Dispense: 90 tablet; Refill: 1  4. Palpitations  - metoprolol succinate (TOPROL-XL) 25 MG 24 hr tablet; Take 1 tablet (25 mg total) by mouth daily.  Dispense: 90 tablet; Refill: 3  5. Gastroesophageal reflux disease with esophagitis without hemorrhage  - famotidine (PEPCID) 20 MG tablet; Take 1 tablet (20 mg total) by mouth daily.  Dispense: 90 tablet; Refill: 1  6. History of endometriosis   7. Obesity (BMI 35.0-39.9 without comorbidity)   8. Panic attack  - hydrOXYzine (ATARAX/VISTARIL) 10 MG tablet; Take 1 tablet (10 mg total) by mouth 3 (three) times daily as needed.  Dispense: 30 tablet; Refill: 0 - ALPRAZolam (XANAX) 0.5 MG tablet; Take 1 tablet (0.5 mg total) by mouth at bedtime as needed for anxiety.  Dispense: 5 tablet; Refill: 0  9. Irritable bowel syndrome with both constipation and diarrhea  - hyoscyamine (LEVSIN) 0.125 MG tablet; Take 1 tablet (0.125 mg total) by mouth every 4 (four) hours as needed.  Dispense: 90 tablet; Refill: 0  10. Breast cancer screening by mammogram  - MM Digital Screening; Future  11. Need for immunization against influenza  - Flu Vaccine QUAD 36+ mos IM

## 2020-05-26 ENCOUNTER — Encounter: Payer: Self-pay | Admitting: Family Medicine

## 2020-06-01 ENCOUNTER — Ambulatory Visit: Payer: BC Managed Care – PPO | Admitting: Family Medicine

## 2020-06-14 ENCOUNTER — Telehealth: Payer: BC Managed Care – PPO | Admitting: Emergency Medicine

## 2020-06-14 ENCOUNTER — Other Ambulatory Visit: Payer: Self-pay | Admitting: Emergency Medicine

## 2020-06-14 DIAGNOSIS — R059 Cough, unspecified: Secondary | ICD-10-CM

## 2020-06-14 MED ORDER — ALBUTEROL SULFATE HFA 108 (90 BASE) MCG/ACT IN AERS: 2.0000 | INHALATION_SPRAY | RESPIRATORY_TRACT | 0 refills | Status: DC | PRN

## 2020-06-14 MED ORDER — BENZONATATE 200 MG PO CAPS: 200.0000 mg | ORAL_CAPSULE | Freq: Two times a day (BID) | ORAL | 0 refills | Status: DC | PRN

## 2020-06-14 NOTE — Progress Notes (Signed)
We are sorry that you are not feeling well.  Here is how we plan to help!  Based on your presentation I believe you most likely have A cough due to a virus.  This is called viral bronchitis and is best treated by rest, plenty of fluids and control of the cough.  You may use Ibuprofen or Tylenol as directed to help your symptoms.     In addition you may use A prescription cough medication called Tessalon Perles 100mg . You may take 1-2 capsules every 8 hours as needed for your cough.  I'll send for an inhaler if you want to try it.  I know you said no wheezing, but it might help.   Providers prescribe antibiotics to treat infections caused by bacteria. Antibiotics are very powerful in treating bacterial infections when they are used properly. To maintain their effectiveness, they should be used only when necessary. Overuse of antibiotics has resulted in the development of superbugs that are resistant to treatment!    After careful review of your answers, I would not recommend an antibiotic for your condition.  Antibiotics are not effective against viruses and therefore should not be used to treat them. Common examples of infections caused by viruses include colds and flu.  From your responses in the eVisit questionnaire you describe inflammation in the upper respiratory tract which is causing a significant cough.  This is commonly called Bronchitis and has four common causes:    Allergies  Viral Infections  Acid Reflux  Bacterial Infection Allergies, viruses and acid reflux are treated by controlling symptoms or eliminating the cause. An example might be a cough caused by taking certain blood pressure medications. You stop the cough by changing the medication. Another example might be a cough caused by acid reflux. Controlling the reflux helps control the cough.  USE OF BRONCHODILATOR ("RESCUE") INHALERS: There is a risk from using your bronchodilator too frequently.  The risk is that  over-reliance on a medication which only relaxes the muscles surrounding the breathing tubes can reduce the effectiveness of medications prescribed to reduce swelling and congestion of the tubes themselves.  Although you feel brief relief from the bronchodilator inhaler, your asthma may actually be worsening with the tubes becoming more swollen and filled with mucus.  This can delay other crucial treatments, such as oral steroid medications. If you need to use a bronchodilator inhaler daily, several times per day, you should discuss this with your provider.  There are probably better treatments that could be used to keep your asthma under control.     HOME CARE . Only take medications as instructed by your medical team. . Complete the entire course of an antibiotic. . Drink plenty of fluids and get plenty of rest. . Avoid close contacts especially the very young and the elderly . Cover your mouth if you cough or cough into your sleeve. . Always remember to wash your hands . A steam or ultrasonic humidifier can help congestion.   GET HELP RIGHT AWAY IF: . You develop worsening fever. . You become short of breath . You cough up blood. . Your symptoms persist after you have completed your treatment plan MAKE SURE YOU   Understand these instructions.  Will watch your condition.  Will get help right away if you are not doing well or get worse.  Your e-visit answers were reviewed by a board certified advanced clinical practitioner to complete your personal care plan.  Depending on the condition, your plan could  have included both over the counter or prescription medications. If there is a problem please reply  once you have received a response from your provider. Your safety is important to [place].  If you have drug allergies check your prescription carefully.    You can use MyChart to ask questions about today's visit, request a non-urgent call back, or ask for a work or school excuse for 24 hours  related to this e-Visit. If it has been greater than 24 hours you will need to follow up with your provider, or enter a new e-Visit to address those concerns. You will get an e-mail in the next two days asking about your experience.  I hope that your e-visit has been valuable and will speed your recovery. Thank you for using e-visits.  Approximately 5 minutes was used in reviewing the patient's chart, questionnaire, prescribing medications, and documentation.

## 2020-06-17 ENCOUNTER — Telehealth: Payer: BC Managed Care – PPO | Admitting: Family Medicine

## 2020-07-26 ENCOUNTER — Telehealth: Payer: BC Managed Care – PPO | Admitting: Physician Assistant

## 2020-07-26 ENCOUNTER — Other Ambulatory Visit: Payer: Self-pay | Admitting: Physician Assistant

## 2020-07-26 DIAGNOSIS — A6000 Herpesviral infection of urogenital system, unspecified: Secondary | ICD-10-CM | POA: Diagnosis not present

## 2020-07-26 MED ORDER — VALACYCLOVIR HCL 500 MG PO TABS: 500.0000 mg | ORAL_TABLET | Freq: Two times a day (BID) | ORAL | 0 refills | Status: DC

## 2020-07-26 NOTE — Progress Notes (Signed)
E-Visit for Herpes Simplex  We are sorry that you are not feeling well.  Here is how we plan to help!  Based on what you have shared ith me, it looks like you may be having an outbreak/flare-up of genital herpes.    I have prescribed I have prescribed Valacyclovir 500 mg Take one by mouth twice a day for 3 days.    If you have been prescribed long term medications to be taken on a regular basis, it is important to follow the recommendations and take them as ordered.    Outbreaks usually include blisters and open sores in the genital area. Outbreaks that happen after the first time are usually not as severe and do not last as long. Genital Herpes Simplex is a commonly sexually transmitted viral infection that is found worldwide. Most of these genital infections are caused by one or two herpes simplex viruses that is passed from person to person during vaginal, oral, or anal sex. Sometimes, people do not know they have herpes because they do not have any symptoms.  Please be aware that if you have genital herpes you can be contagious even when you are not having rash or flare-up and you may not have any symptoms, even when you are taking suppressive medicines.  Herpes cannot be cured. The disease usually causes most problems during the first few years. After that, the virus is still there, but it causes few to no symptoms. Even when the virus is active, people with herpes can take medicines to reduce and help prevent symptoms.  Herpes is an infection that can cause blisters and open sores on the genital area. Herpes is caused by a virus that is passed from person to person during vaginal, oral, or anal sex. Sometimes, people do not know they have herpes because they do not have any symptoms. Herpes cannot be cured. The disease usually causes most problems during the first few years. After that, the virus is still there, but it causes few to no symptoms. Even when the virus is active, people with herpes  can take medicines to reduce and help prevent symptoms.  If you have been prescribed medications to be taken on a regular basis, it is important to follow the recommendations and take them as ordered.  Some people with herpes never have any symptoms. But other people can develop symptoms within a few weeks of being infected with the herpes virus   Symptoms usually include blisters in the genital area. In women, this area includes the vagina, buttocks, anus, or thighs. In men, this area includes the penis, scrotum, anus, butt, or thighs. The blisters can become painful open sores, which then crust over as they heal. Sometimes, people can have other symptoms that include:  ?Blisters on the mouth or lips ?Fever, headache, or pain in the joints ?Trouble urinating  Outbreaks might occur every month or more often, or just once or twice a year. Sometimes, people can tell when an outbreak will occur, because they feel itching or pain beforehand. Sometimes they do not know that an outbreak is coming because they have no symptoms. Whatever your pattern is, keep in mind that herpes outbreaks usually become less frequent over time as you get older. Certain things, called "triggers," can make outbreaks more likely to occur. These include stress, sunlight, menstrual periods,or getting sick.  Antiviral therapy can shorten the duration of symptoms and signs in primary infection, which, when untreated, can be associated with significant increase in the   symptoms of the disease.  HOME CARE  Use a portable bath (such as a "Sitz bath") where you can sit in warm water for about 20 minutes. Your bathtub could also work. Avoid bubble baths.   Keep the genital area clean and dry and avoid tight clothes.   Take over-the-counter pain medicine such as acetaminophen (brand name: Tylenol) or ibuprofen sample brand names: Advil, Motrin). But avoid aspirin.   Only take medications as instructed by your medical  team.   You are most likely to spread herpes to a sex partner when you have blisters and open sores on your body. But it's also possible to spread herpes to your partner when you do not have any symptoms. That is because herpes can be present on your body without causing any symptoms, like blisters or pain.   Telling your sex partner that you have herpes can be hard. But it can help protect them, since there are ways to lower the risk of spreading the infection.    Using a condom every time you have sex   Not having sex when you have symptoms   Not having oral sex if you have blisters or open sores (in the genital area or around your mouth)  MAKE SURE YOU    Understand these instructions.  Do not have sex without using a condom until you have been seen by a doctor and as instructed by the provider  If you are not better or improved within 7 days, you MUST have a follow up at your doctor or the health department for evaluation. There are other causes of rashes in the genital region.  Thank you for choosing an e-visit. Your e-visit answers were reviewed by a board certified advanced clinical practitioner to complete your personal care plan. Depending upon the condition, your plan could have included both over the counter or prescription medications.  Please review your pharmacy choice. Make sure the pharmacy is open so you can pick up prescription now. If there is a problem, you may contact your provider through [employer] and have the prescription routed to another pharmacy.  Your safety is important to [place]. If you have drug allergies check your prescription carefully.   For the next 24 hours you can use MyChart to ask questions about today's visit, request a non-urgent call back, or ask for a work or school excuse. You will get an email in the next two days asking about your experience. I hope that your e-visit has been valuable and will speed your recovery    Greater than  5 minutes, yet less than 10 minutes of time have been spent researching, coordinating, and implementing care for this patient today

## 2020-08-17 ENCOUNTER — Encounter: Payer: BC Managed Care – PPO | Admitting: Family Medicine

## 2020-08-22 ENCOUNTER — Other Ambulatory Visit: Payer: Self-pay | Admitting: Emergency Medicine

## 2020-08-22 ENCOUNTER — Telehealth: Payer: Self-pay | Admitting: Emergency Medicine

## 2020-08-22 DIAGNOSIS — R059 Cough, unspecified: Secondary | ICD-10-CM

## 2020-08-22 MED ORDER — BENZONATATE 100 MG PO CAPS: 100.0000 mg | ORAL_CAPSULE | Freq: Two times a day (BID) | ORAL | 0 refills | Status: DC | PRN

## 2020-08-22 MED ORDER — AZITHROMYCIN 250 MG PO TABS: ORAL_TABLET | ORAL | 0 refills | Status: DC

## 2020-08-22 NOTE — Progress Notes (Signed)
We are sorry that you are not feeling well.  Here is how we plan to help!  Based on your presentation I believe you most likely have A cough due to bacteria.  When patients have a fever and a productive cough with a change in color or increased sputum production, we are concerned about bacterial bronchitis.  If left untreated it can progress to pneumonia.  If your symptoms do not improve with your treatment plan it is important that you contact your provider.   I have prescribed Azithromyin 250 mg: two tablets now and then one tablet daily for 4 additonal days    In addition you may use A prescription cough medication called Tessalon Perles 100mg . You may take 1-2 capsules every 8 hours as needed for your cough.  I would also recommend nasal saline rinses.  From your responses in the eVisit questionnaire you describe inflammation in the upper respiratory tract which is causing a significant cough.  This is commonly called Bronchitis and has four common causes:    Allergies  Viral Infections  Acid Reflux  Bacterial Infection Allergies, viruses and acid reflux are treated by controlling symptoms or eliminating the cause. An example might be a cough caused by taking certain blood pressure medications. You stop the cough by changing the medication. Another example might be a cough caused by acid reflux. Controlling the reflux helps control the cough.  USE OF BRONCHODILATOR ("RESCUE") INHALERS: There is a risk from using your bronchodilator too frequently.  The risk is that over-reliance on a medication which only relaxes the muscles surrounding the breathing tubes can reduce the effectiveness of medications prescribed to reduce swelling and congestion of the tubes themselves.  Although you feel brief relief from the bronchodilator inhaler, your asthma may actually be worsening with the tubes becoming more swollen and filled with mucus.  This can delay other crucial treatments, such as oral steroid  medications. If you need to use a bronchodilator inhaler daily, several times per day, you should discuss this with your provider.  There are probably better treatments that could be used to keep your asthma under control.     HOME CARE . Only take medications as instructed by your medical team. . Complete the entire course of an antibiotic. . Drink plenty of fluids and get plenty of rest. . Avoid close contacts especially the very young and the elderly . Cover your mouth if you cough or cough into your sleeve. . Always remember to wash your hands . A steam or ultrasonic humidifier can help congestion.   GET HELP RIGHT AWAY IF: . You develop worsening fever. . You become short of breath . You cough up blood. . Your symptoms persist after you have completed your treatment plan MAKE SURE YOU   Understand these instructions.  Will watch your condition.  Will get help right away if you are not doing well or get worse.  Your e-visit answers were reviewed by a board certified advanced clinical practitioner to complete your personal care plan.  Depending on the condition, your plan could have included both over the counter or prescription medications. If there is a problem please reply  once you have received a response from your provider. Your safety is important to [place].  If you have drug allergies check your prescription carefully.    You can use MyChart to ask questions about today's visit, request a non-urgent call back, or ask for a work or school excuse for 24 hours related  to this e-Visit. If it has been greater than 24 hours you will need to follow up with your provider, or enter a new e-Visit to address those concerns. You will get an e-mail in the next two days asking about your experience.  I hope that your e-visit has been valuable and will speed your recovery. Thank you for using e-visits.  Approximately 5 minutes was used in reviewing the patient's chart, questionnaire,  prescribing medications, and documentation.

## 2020-08-25 ENCOUNTER — Encounter: Payer: Self-pay | Admitting: Family Medicine

## 2020-08-25 ENCOUNTER — Telehealth: Payer: Self-pay

## 2020-08-25 NOTE — Telephone Encounter (Signed)
Copied from [place] [id number]. Topic: General - Other >> Aug 25, 2020  9:22 AM Yvette Rack wrote: Reason for CRM: Pt stated she is taking the antibiotic but she is not getting any better. 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 Pt requests that a prescription be sent to her pharmacy

## 2020-08-26 ENCOUNTER — Encounter: Payer: Self-pay | Admitting: Nurse Practitioner

## 2020-08-26 ENCOUNTER — Other Ambulatory Visit: Payer: Self-pay | Admitting: Nurse Practitioner

## 2020-08-26 ENCOUNTER — Telehealth: Payer: Self-pay | Admitting: Nurse Practitioner

## 2020-08-26 DIAGNOSIS — J069 Acute upper respiratory infection, unspecified: Secondary | ICD-10-CM

## 2020-08-26 DIAGNOSIS — R062 Wheezing: Secondary | ICD-10-CM

## 2020-08-26 DIAGNOSIS — R059 Cough, unspecified: Secondary | ICD-10-CM

## 2020-08-26 MED ORDER — ALBUTEROL SULFATE HFA 108 (90 BASE) MCG/ACT IN AERS: 2.0000 | INHALATION_SPRAY | RESPIRATORY_TRACT | 0 refills | Status: DC | PRN

## 2020-08-26 NOTE — Progress Notes (Signed)
Virtual Visit via televisit video call    This visit type was conducted due to national recommendations for restrictions regarding the COVID-19 Pandemic (e.g. social distancing) in an effort to limit this patient's exposure and mitigate transmission in our community.  Due to her co-morbid illnesses, this patient is at least at moderate risk for complications without adequate follow up.  This format is felt to be most appropriate for this patient at this time.  All issues noted in this document were discussed and addressed.  A limited physical exam was performed with this format.    This visit type was conducted due to national recommendations for restrictions regarding the COVID-19 Pandemic (e.g. social distancing) in an effort to limit this patient's exposure and mitigate transmission in our community.  Patients identity confirmed using two different identifiers.  This format is felt to be most appropriate for this patient at this time.  All issues noted in this document were discussed and addressed.  No physical exam was performed (except for noted visual exam findings with Video Visits).    [name], Cook are scheduled for a virtual visit with your provider today.    Just as we do with appointments in the office, we must obtain your consent to participate.  Your consent will be active for this visit and any virtual visit you may have with one of our providers in the next 365 days.    If you have a MyChart account, I can also send a copy of this consent to you electronically.  All virtual visits are billed to your insurance company just like a traditional visit in the office.  As this is a virtual visit, video technology does not allow for your provider to perform a traditional examination.  This may limit your provider's ability to fully assess your condition.  If your provider identifies any concerns that need to be evaluated in person or the need to arrange testing such as labs, EKG, etc, we  will make arrangements to do so.    Although advances in technology are sophisticated, we cannot ensure that it will always work on either your end or our end.  If the connection with a video visit is poor, we may have to switch to a telephone visit.  With either a video or telephone visit, we are not always able to ensure that we have a secure connection.   I need to obtain your verbal consent now.   Are you willing to proceed with your visit today? YES  Jill Castilla, NP 08/26/2020  10:19 AM   Date:  08/26/2020   ID:  Jill Cook, DOB 1965/11/20, MRN [id number]  Patient Location:  Home   Provider location:   Home Office  Chief Complaint: Complaints of wheezing when she breaths.   History of Present Illness:    Jill Cook is a 55 y.o. female who presents via video conferencing for a telehealth visit today.    She did a E-visit on Monday. She was tested negative for COVID. Wheezing when she breaths. 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 She has taken tessalon pearls. She is also taking z-pak. She started taking it on Monday.She is coughing sputum at time. No fever. No shortness of breath. No Nausea. No diarrhea. No bodyaches.        Past Medical History:  Diagnosis Date  . Anal fissure   . Anemia   . Anxiety   .  Arthritis    RIGHT HIP AND WRIST  . Colon polyps 2017  . Diverticulosis   . GERD (gastroesophageal reflux disease)    OTC PRN  . History of esophagitis   . Hypertension   . OA (osteoarthritis) of knee    RIGHT   Past Surgical History:  Procedure Laterality Date  . ANAL FISSURE REPAIR  06-13-2000  . BUNIONECTOMY Right 1991  . CESAREAN SECTION  1994  . COLONOSCOPY WITH PROPOFOL N/A 06/03/2019   Procedure: COLONOSCOPY WITH PROPOFOL;  Surgeon: Virgel Manifold, MD;  Location: [facility];  Service: Endoscopy;  Laterality: N/A;  . 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 D & C HYSTEROSCOPY /  NOVASURE ENDOMETRIAL ABLATION  11-15-2005  . ESOPHAGOGASTRODUODENOSCOPY (EGD) WITH PROPOFOL N/A 06/03/2019   Procedure: ESOPHAGOGASTRODUODENOSCOPY (EGD) WITH PROPOFOL;  Surgeon: Virgel Manifold, MD;  Location: [facility];  Service: Endoscopy;  Laterality: N/A;  . KNEE ARTHROSCOPY W/ MENISCECTOMY Right   . KNEE ARTHROSCOPY WITH MEDIAL MENISECTOMY Right 10/06/2013   Procedure: RIGHT KNEE ARTHROSCOPY WITH CHONDROPLASTY;  Surgeon: Sydnee Cabal, MD;  Location: [facility];  Service: Orthopedics;  Laterality: Right;  . LAPAROSCOPY N/A 11/01/2015   Procedure: LAPAROSCOPY DIAGNOSTIC;  Surgeon: Molli Posey, MD;  Location: [facility];  Service: Gynecology;  Laterality: N/A;  . RIGHT KNEE PATELLOFEMORAL ARTHROPLASTY  06-10-2008  . SEPTOPLASTY  2000  . TONSILLECTOMY  AGE 67  . TRANSTHORACIC ECHOCARDIOGRAM  06-03-2008   NORMAL /  EF 60%  . TUBAL LIGATION  1996     No outpatient medications have been marked as taking for the 08/26/20 encounter (Appointment) with Jill Castilla, NP.     Allergies:   Codeine, Hydrocodone, Hydromorphone hcl, and Morphine   Social History   Tobacco Use  . Smoking status: Never Smoker  . Smokeless tobacco: Never Used  Vaping Use  . Vaping Use: Never used  Substance Use Topics  . Alcohol use: Yes    Comment: 1 drink per month  . Drug use: No     Family Hx: The patient's family history includes Atrial fibrillation in her mother; Colon cancer in her maternal aunt; Diabetes in her paternal grandfather; Esophageal cancer in her maternal grandfather; Heart attack (age of onset: 70) in her father; Heart disease in her father; Hypertension in her mother and paternal grandfather; Stroke in her mother; Thyroid disease in her mother; Uterine cancer in her paternal  aunt.  ROS:   Please see the history of present illness.    Review of Systems  Constitutional: Negative for chills, fever and malaise/fatigue.  HENT: Negative for congestion, sinus pain and sore throat.   Respiratory: Positive for cough and wheezing. Negative for shortness of breath.   Cardiovascular: Negative for chest pain.  Gastrointestinal: Negative for diarrhea and nausea.  Musculoskeletal: Negative for myalgias.    All other systems reviewed and are negative.   Labs/Other Tests and Data Reviewed:    Recent Labs: No results found for requested labs within last 8760 hours.   Recent Lipid Panel Lab Results  Component Value Date/Time   CHOL 159 03/20/2019 11:34 AM   TRIG 184 (H) 03/20/2019 11:34 AM   HDL 42 (L) 03/20/2019 11:34 AM   CHOLHDL 3.8 03/20/2019 11:34 AM   LDLCALC 89 03/20/2019 11:34 AM    Wt Readings from Last 3 Encounters:  04/15/20 202 lb 12.8 oz (92 kg)  06/03/19 219 lb (99.3 kg)  05/21/19 219 lb (99.3 kg)     Exam:    Vital Signs:  There were no vitals taken for this visit.    Physical Exam Vitals and nursing note reviewed.  HENT:     Head: Normocephalic and atraumatic.  Pulmonary:     Effort: Pulmonary effort is normal.  Neurological:     Mental Status: She is alert and oriented to person, place, and time.  Psychiatric:        Mood and Affect: Mood and affect normal.     ASSESSMENT & PLAN:    1) URI (unspecific)  -Will continue to use OTC tylenol and ibuprofen every 4-6 hours as needed.  -Tested neg. For COVID  -Use antihistamines and nasal spray as needed  -Follow up with PCP if symptoms do not improve or get better. -Hydrate, Rest  -Take vitamin C, D and Zinc    2) Wheezing  -Wheezing when she coughs  -Will send a prescription for albuterol to use every 4-6 hours as needed for wheezing or SOB -Advised patient to follow up with her PCP if her symptoms do not resolve or get worse  3) Cough -Patient has tried Tessalon pearls   -Currently taking z-pak. Educated patient to complete the full course. -Advised patient to take OTC Delsym for cough as needed  -Follow up with PCP if her symptoms do not improve or get worse.   COVID-19 Education: The signs and symptoms of COVID-19 were discussed with the patient and how to seek care for testing (follow up with PCP or arrange E-visit).  The importance of social distancing was discussed today.  Patient Risk:   After full review of this patients clinical status, I feel that they are at least moderate risk at this time.  Time:   Today, I have spent  minutes/ seconds with the patient with telehealth technology discussing above diagnoses.     Medication Adjustments/Labs and Tests Ordered: Current medicines are reviewed at length with the patient today.  Concerns regarding medicines are outlined above.   Tests Ordered: No orders of the defined types were placed in this encounter.   Medication Changes: No orders of the defined types were placed in this encounter.   Disposition:  Follow up if symptoms do not improve or get better. If patient experiences any SOB or chest pain to seek emergency care.   Signed, Jill Castilla, NP

## 2020-08-30 NOTE — Telephone Encounter (Signed)
Called patient no answer LVM and sent mychart message.

## 2020-09-09 ENCOUNTER — Encounter: Payer: Self-pay | Admitting: Family Medicine

## 2020-10-19 ENCOUNTER — Emergency Department
Admission: EM | Admit: 2020-10-19 | Discharge: 2020-10-19 | Disposition: A | Discharge status: Home / Self Care | Payer: BC Managed Care – PPO | Attending: Emergency Medicine | Admitting: Emergency Medicine

## 2020-10-19 ENCOUNTER — Other Ambulatory Visit: Payer: Self-pay

## 2020-10-19 ENCOUNTER — Emergency Department: Payer: BC Managed Care – PPO

## 2020-10-19 ENCOUNTER — Ambulatory Visit: Payer: Self-pay

## 2020-10-19 DIAGNOSIS — R5383 Other fatigue: Secondary | ICD-10-CM | POA: Insufficient documentation

## 2020-10-19 DIAGNOSIS — Z96651 Presence of right artificial knee joint: Secondary | ICD-10-CM | POA: Diagnosis not present

## 2020-10-19 DIAGNOSIS — R0789 Other chest pain: Secondary | ICD-10-CM | POA: Diagnosis not present

## 2020-10-19 DIAGNOSIS — Z7982 Long term (current) use of aspirin: Secondary | ICD-10-CM | POA: Insufficient documentation

## 2020-10-19 DIAGNOSIS — Z79899 Other long term (current) drug therapy: Secondary | ICD-10-CM | POA: Insufficient documentation

## 2020-10-19 DIAGNOSIS — I10 Essential (primary) hypertension: Secondary | ICD-10-CM | POA: Insufficient documentation

## 2020-10-19 LAB — BRAIN NATRIURETIC PEPTIDE: B Natriuretic Peptide: 25.6 pg/mL (ref 0.0–100.0)

## 2020-10-19 LAB — BASIC METABOLIC PANEL
Anion gap: 7 (ref 5–15)
BUN: 12 mg/dL (ref 6–20)
CO2: 27 mmol/L (ref 22–32)
Calcium: 9.1 mg/dL (ref 8.9–10.3)
Chloride: 105 mmol/L (ref 98–111)
Creatinine, Ser: 0.86 mg/dL (ref 0.44–1.00)
GFR, Estimated: >60 mL/min (ref >60)
Glucose, Bld: 104 mg/dL — ABNORMAL HIGH (ref 70–99)
Potassium: 3.7 mmol/L (ref 3.5–5.1)
Sodium: 139 mmol/L (ref 135–145)

## 2020-10-19 LAB — CBC
HCT: 36.5 % (ref 36.0–46.0)
Hemoglobin: 12.5 g/dL (ref 12.0–15.0)
MCH: 29 pg (ref 26.0–34.0)
MCHC: 34.2 g/dL (ref 30.0–36.0)
MCV: 84.7 fL (ref 80.0–100.0)
Platelets: 150 10*3/uL (ref 150–400)
RBC: 4.31 MIL/uL (ref 3.87–5.11)
RDW: 13.2 % (ref 11.5–15.5)
WBC: 4.4 10*3/uL (ref 4.0–10.5)
nRBC: 0 % (ref 0.0–0.2)

## 2020-10-19 LAB — TROPONIN I (HIGH SENSITIVITY): Troponin I (High Sensitivity): 2 ng/L (ref <18)

## 2020-10-19 NOTE — Discharge Instructions (Addendum)
You may consider starting Pepcid (famotidine) once or twice daily for the next few weeks if you continue to have intermittent indigestion, heartburn, or chest discomfort, especially after eating.  Return to the ER for new, worsening, or persistent severe chest pain, difficulty breathing, weakness or lightheadedness, nausea or vomiting, or any other new or worsening symptoms that concern you.  Follow-up with your primary care doctor.

## 2020-10-19 NOTE — ED Notes (Signed)
Pt verbally consented to receiving discharge instructions.

## 2020-10-19 NOTE — ED Provider Notes (Signed)
[facility] Emergency Department Provider Note ____________________________________________   Event Date/Time   First MD Initiated Contact with Patient 10/19/20 [phone]     (approximate)  I have reviewed the triage vital signs and the nursing notes.   HISTORY  Chief Complaint Fatigue and Chest Pain    HPI Jill Cook is a 55 y.o. female with PMH as noted below who presents with an episode of chest pain yesterday around 5 PM.  She states that the pain felt like "a vise" on the center of her chest.  It occurred while she was sitting on a sofa and not exerting herself.  It lasted about 5 minutes and then reoccurred twice over the next hour.  She has not had any pain since yesterday evening.  She had not eaten recently.  The patient denies any associated nausea or vomiting, lightheadedness, or significant shortness of breath.  She states that yesterday evening and today she has felt fatigued, but that this is not unusual for her.  She also reports some mild bilateral leg swelling over the last several weeks.  Past Medical History:  Diagnosis Date  . Anal fissure   . Anemia   . Anxiety   . Arthritis    RIGHT HIP AND WRIST  . Colon polyps 2017  . Diverticulosis   . GERD (gastroesophageal reflux disease)    OTC PRN  . History of esophagitis   . Hypertension   . OA (osteoarthritis) of knee    RIGHT    Patient Active Problem List   Diagnosis Date Noted  . History of endometriosis 04/15/2020  . Essential hypertension 05/11/2019  . Palpitations 05/11/2019  . Bilateral carpal tunnel syndrome 01/08/2019  . Polyp of colon 01/08/2019  . Family history of colorectal cancer 01/08/2019  . Obese 08/23/2015  . 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 Generalized anxiety disorder 08/17/2015  . Depression 08/17/2015  . Gastroesophageal reflux disease with esophagitis without hemorrhage 10/14/2008  . Irritable bowel syndrome 04/12/2008    Past  Surgical History:  Procedure Laterality Date  . ANAL FISSURE REPAIR  06-13-2000  . BUNIONECTOMY Right 1991  . CESAREAN SECTION  1994  . COLONOSCOPY WITH PROPOFOL N/A 06/03/2019   Procedure: COLONOSCOPY WITH PROPOFOL;  Surgeon: Virgel Manifold, MD;  Location: [facility] ENDOSCOPY;  Service: Endoscopy;  Laterality: N/A;  . 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 D & C HYSTEROSCOPY /  NOVASURE ENDOMETRIAL ABLATION  11-15-2005  . ESOPHAGOGASTRODUODENOSCOPY (EGD) WITH PROPOFOL N/A 06/03/2019   Procedure: ESOPHAGOGASTRODUODENOSCOPY (EGD) WITH PROPOFOL;  Surgeon: Virgel Manifold, MD;  Location: [facility] ENDOSCOPY;  Service: Endoscopy;  Laterality: N/A;  . KNEE ARTHROSCOPY W/ MENISCECTOMY Right   . KNEE ARTHROSCOPY WITH MEDIAL MENISECTOMY Right 10/06/2013   Procedure: RIGHT KNEE ARTHROSCOPY WITH CHONDROPLASTY;  Surgeon: Sydnee Cabal, MD;  Location: [facility];  Service: Orthopedics;  Laterality: Right;  . LAPAROSCOPY N/A 11/01/2015   Procedure: LAPAROSCOPY DIAGNOSTIC;  Surgeon: Molli Posey, MD;  Location: [facility];  Service: Gynecology;  Laterality: N/A;  . RIGHT KNEE PATELLOFEMORAL ARTHROPLASTY  06-10-2008  . SEPTOPLASTY  2000  . TONSILLECTOMY  AGE 39  . TRANSTHORACIC ECHOCARDIOGRAM  06-03-2008   NORMAL /  EF 60%  . TUBAL LIGATION  1996    Prior to Admission medications   Medication Sig  Start Date End Date Taking? Authorizing Provider  albuterol (VENTOLIN HFA) 108 (90 Base) MCG/ACT inhaler Inhale 2 puffs into the lungs every 4 (four) hours as needed for wheezing or shortness of breath. 08/26/20   Bary Castilla, NP  ALPRAZolam (XANAX) 0.5 MG tablet Take 1 tablet (0.5 mg total) by mouth at bedtime as needed for anxiety. 04/15/20   Steele Sizer, MD  aspirin EC 81 MG tablet Take 81 mg by mouth daily.     [provider]  azithromycin (ZITHROMAX) 250 MG tablet Take 2 tabs today, then take 1 tab daily until gone. 08/22/20   Montine Circle, PA-C  benzonatate (TESSALON) 100 MG capsule Take 1 capsule (100 mg total) by mouth 2 (two) times daily as needed for cough. 08/22/20   Montine Circle, PA-C  BIOTIN PO Take by mouth.    [provider]  buPROPion (WELLBUTRIN XL) 300 MG 24 hr tablet Take 1 tablet (300 mg total) by mouth daily. 04/15/20   Steele Sizer, MD  Calcium Carb-Cholecalciferol (CALCIUM-VITAMIN D) 600-400 MG-UNIT TABS Take 1 tablet by mouth daily.    [provider]  cetirizine (ZYRTEC) 10 MG tablet Take 10 mg by mouth daily.    [provider]  famotidine (PEPCID) 20 MG tablet Take 1 tablet (20 mg total) by mouth daily. 04/15/20   Steele Sizer, MD  fluticasone (FLONASE) 50 MCG/ACT nasal spray Place 2 sprays into both nostrils daily. 07/14/19   Muthersbaugh, Jarrett Soho, PA-C  hydrOXYzine (ATARAX/VISTARIL) 10 MG tablet Take 1 tablet (10 mg total) by mouth 3 (three) times daily as needed. 04/15/20   Steele Sizer, MD  hyoscyamine (LEVSIN) 0.125 MG tablet Take 1 tablet (0.125 mg total) by mouth every 4 (four) hours as needed. 04/15/20   Steele Sizer, MD  metoprolol succinate (TOPROL-XL) 25 MG 24 hr tablet Take 1 tablet (25 mg total) by mouth daily. 04/15/20   Steele Sizer, MD    Allergies Codeine, Hydrocodone, Hydromorphone hcl, and Morphine  Family History  Problem Relation Age of Onset  . Hypertension Mother   . Thyroid disease Mother   . Atrial fibrillation Mother   . Stroke Mother   . Heart disease Father   . Heart attack Father 72  . Hypertension Paternal Grandfather   . Diabetes Paternal Grandfather   . Uterine cancer Paternal Aunt   . Colon cancer Maternal Aunt        dx in her 25's  . Esophageal cancer Maternal Grandfather     Social History Social History   Tobacco Use  . Smoking status: Never Smoker  . Smokeless tobacco:  Never Used  Vaping Use  . Vaping Use: Never used  Substance Use Topics  . Alcohol use: Yes    Comment: 1 drink per month  . Drug use: No    Review of Systems  Constitutional: No fever/chills Eyes: No visual changes. ENT: No sore throat. Cardiovascular: Positive for resolved chest pain. Respiratory: Denies shortness of breath. Gastrointestinal: No vomiting or diarrhea.  Genitourinary: Negative for dysuria.  Musculoskeletal: Negative for back pain. Skin: Negative for rash. Neurological: Negative for headaches, focal weakness or numbness.   ____________________________________________   PHYSICAL EXAM:  VITAL SIGNS: ED Triage Vitals [10/19/20 0921]  Enc Vitals Group     BP (!) 155/88     Pulse Rate 82     Resp 18     Temp 98.3 F (36.8 C)     Temp Source Oral     SpO2 100 %  Weight 195 lb (88.5 kg)     Height 5\' 5"  (1.651 m)     Head Circumference      Peak Flow      Pain Score 0     Pain Loc      Pain Edu?      Excl. in [place]?     Constitutional: Alert and oriented. Well appearing and in no acute distress. Eyes: Conjunctivae are normal.  Head: Atraumatic. Nose: No congestion/rhinnorhea. Mouth/Throat: Mucous membranes are moist.   Neck: Normal range of motion.  Cardiovascular: Normal rate, regular rhythm. Grossly normal heart sounds.  Good peripheral circulation. Respiratory: Normal respiratory effort.  No retractions. Lungs CTAB. Gastrointestinal: No distention.  Musculoskeletal: Trace bilateral lower extremity edema.  Extremities warm and well perfused.  Neurologic:  Normal speech and language. No gross focal neurologic deficits are appreciated.  Skin:  Skin is warm and dry. No rash noted. Psychiatric: Mood and affect are normal. Speech and behavior are normal.  ____________________________________________   LABS (all labs ordered are listed, but only abnormal results are displayed)  Labs Reviewed  BASIC METABOLIC PANEL - Abnormal; Notable for the  following components:      Result Value   Glucose, Bld 104 (*)    All other components within normal limits  CBC  BRAIN NATRIURETIC PEPTIDE  POC URINE PREG, ED  TROPONIN I (HIGH SENSITIVITY)   ____________________________________________  EKG  ED ECG REPORT I, Arta Silence, the attending physician, personally viewed and interpreted this ECG.  Date: 10/19/2020 EKG Time: 09 25 Rate: 77 Rhythm: normal sinus rhythm QRS Axis: normal Intervals: normal ST/T Wave abnormalities: normal Narrative Interpretation: no evidence of acute ischemia  ____________________________________________  RADIOLOGY  Chest x-ray interpreted by me shows no focal infiltrate or edema  ____________________________________________   PROCEDURES  Procedure(s) performed: No  Procedures  Critical Care performed: No ____________________________________________   INITIAL IMPRESSION / ASSESSMENT AND PLAN / ED COURSE  Pertinent labs & imaging results that were available during my care of the patient were reviewed by me and considered in my medical decision making (see chart for details).  55 year old female with PMH as noted above and no prior cardiac history presents with several episodes of atypical, nonexertional chest pain yesterday evening over approximately an hour.  The pain has not reoccurred since then.  The patient reports mild fatigue at this time.  Review of systems is otherwise negative except for mild recent bilateral leg swelling.  The patient also reports a recent episode of indigestion a few weeks ago after drinking wine.  On exam, the patient is overall well-appearing.  Her vital signs are normal except for mild hypertension.  The physical exam is otherwise unremarkable.  EKG is nonischemic.  Differential includes GERD, musculoskeletal pain, or other benign etiology, versus less likely primary cardiac cause.  There is no clinical evidence for PE or for an aortic dissection or  other vascular cause especially given the resolved symptoms.  We will obtain basic labs, troponin, chest x-ray, and reassess.  ----------------------------------------- 11:37 AM on 10/19/2020 -----------------------------------------  Lab work-up is unremarkable.  Given that the episode occurred more than 12 hours ago and the patient is asymptomatic, there is no indication for a repeat troponin.  She is stable for discharge at this time.  I counseled her on the results of the work-up.  I recommend she start an H2 blocker and follow-up with her PMD.  Return precautions given, and she expresses understanding.  ____________________________________________   FINAL CLINICAL IMPRESSION(S) /  ED DIAGNOSES  Final diagnoses:  Atypical chest pain      NEW MEDICATIONS STARTED DURING THIS VISIT:  New Prescriptions   No medications on file     Note:  This document was prepared using Dragon voice recognition software and may include unintentional dictation errors.    Arta Silence, MD 10/19/20 1137

## 2020-10-19 NOTE — Telephone Encounter (Signed)
  Pt. Reports she had 3 episodes of chest pain last night. Lasted 3-5 minutes each time . Had pain in her jaw. Pain was 8/10. No pain now but is fatigued. Warm transfer to [facility] in the practice. Answer Assessment - Initial Assessment Questions 1. LOCATION: "Where does it hurt?"       Middle and to right 2. RADIATION: "Does the pain go anywhere else?" (e.g., into neck, jaw, arms, back)     Jaw 3. ONSET: "When did the chest pain begin?" (Minutes, hours or days)      Last night 4. PATTERN "Does the pain come and go, or has it been constant since it started?"  "Does it get worse with exertion?"      Had 3 episodes 5. DURATION: "How long does it last" (e.g., seconds, minutes, hours)     3-5 minutes 6. SEVERITY: "How bad is the pain?"  (e.g., Scale 1-10; mild, moderate, or severe)    - MILD (1-3): doesn't interfere with normal activities     - MODERATE (4-7): interferes with normal activities or awakens from sleep    - SEVERE (8-10): excruciating pain, unable to do any normal activities       8 7. CARDIAC RISK FACTORS: "Do you have any history of heart problems or risk factors for heart disease?" (e.g., angina, prior heart attack; diabetes, high blood pressure, high cholesterol, smoker, or strong family history of heart disease)     HTN 8. PULMONARY RISK FACTORS: "Do you have any history of lung disease?"  (e.g., blood clots in lung, asthma, emphysema, birth control pills)     No 9. CAUSE: "What do you think is causing the chest pain?"     Unsure 10. OTHER SYMPTOMS: "Do you have any other symptoms?" (e.g., dizziness, nausea, vomiting, sweating, fever, difficulty breathing, cough)       No 11. PREGNANCY: "Is there any chance you are pregnant?" "When was your last menstrual period?"       No  Protocols used: CHEST PAIN-A-AH

## 2020-10-19 NOTE — ED Triage Notes (Signed)
Pt comes pov from urgent care after having chest tightness with jaw pain last night 3 different times about 5 mins each. Pt woke up this morning and was very fatigued. Pt reports some indigestion and fluid build up on her feet for about a week or so.

## 2020-10-19 NOTE — ED Notes (Signed)
Pt provided with discharge instructions. Pt denies any questions or concerns with follow up care.  Pt left department with all belongings.

## 2020-11-12 ENCOUNTER — Telehealth: Payer: BC Managed Care – PPO | Admitting: Physician Assistant

## 2020-11-12 DIAGNOSIS — H109 Unspecified conjunctivitis: Secondary | ICD-10-CM

## 2020-11-12 MED ORDER — OFLOXACIN 0.3 % OP SOLN: 1.0000 [drp] | Freq: Four times a day (QID) | OPHTHALMIC | 0 refills | Status: DC

## 2020-11-12 NOTE — Progress Notes (Signed)
E-Visit for [employer]   We are sorry that you are not feeling well.  Here is how we plan to help!  Based on what you have shared with me it looks like you have conjunctivitis.  Conjunctivitis is a common inflammatory or infectious condition of the eye that is often referred to as "pink eye".  In most cases it is contagious (viral or bacterial). However, not all conjunctivitis requires antibiotics (ex. Allergic).  We have made appropriate suggestions for you based upon your presentation.  I have prescribed Oflaxacin 1-2 drops 4 times a day times 5 days   Pink eye can be highly contagious.  It is typically spread through direct contact with secretions, or contaminated objects or surfaces that one may have touched.  Strict handwashing is suggested with soap and water is urged.  If not available, use alcohol based had sanitizer.  Avoid unnecessary touching of the eye.  If you wear contact lenses, you will need to refrain from wearing them until you see no white discharge from the eye for at least 24 hours after being on medication.  You should see symptom improvement in 1-2 days after starting the medication regimen.  Call us if symptoms are not improved in 1-2 days.  Home Care:  Wash your hands often!  Do not wear your contacts until you complete your treatment plan.  Avoid sharing towels, bed linen, personal items with a person who has pink eye.  See attention for anyone in your home with similar symptoms.  Get Help Right Away If:  Your symptoms do not improve.  You develop blurred or loss of vision.  Your symptoms worsen (increased discharge, pain or redness)  Your e-visit answers were reviewed by a board certified advanced clinical practitioner to complete your personal care plan.  Depending on the condition, your plan could have included both over the counter or prescription medications.  If there is a problem please reply  once you have received a response from your provider.  Your  safety is important to [place].  If you have drug allergies check your prescription carefully.    You can use MyChart to ask questions about today's visit, request a non-urgent call back, or ask for a work or school excuse for 24 hours related to this e-Visit. If it has been greater than 24 hours you will need to follow up with your provider, or enter a new e-Visit to address those concerns.   You will get an e-mail in the next two days asking about your experience.  I hope that your e-visit has been valuable and will speed your recovery. Thank you for using e-visits.  I provided 5 minutes of non face-to-face time during this encounter for chart review and documentation.

## 2020-11-17 ENCOUNTER — Other Ambulatory Visit: Payer: Self-pay

## 2020-11-17 ENCOUNTER — Telehealth: Payer: BC Managed Care – PPO | Admitting: Physician Assistant

## 2020-11-17 DIAGNOSIS — B9789 Other viral agents as the cause of diseases classified elsewhere: Secondary | ICD-10-CM

## 2020-11-17 MED ORDER — IPRATROPIUM BROMIDE 0.03 % NA SOLN
2.0000 | Freq: Two times a day (BID) | NASAL | 0 refills | Status: DC
  Filled 2020-11-17: qty 30, 30d supply, fill #0

## 2020-11-17 NOTE — Progress Notes (Signed)

## 2020-11-17 NOTE — Progress Notes (Signed)
I have spent 5 minutes in review of e-visit questionnaire, review and updating patient chart, medical decision making and response to patient.   Marquette Blodgett Cody Krysti Hickling, PA-C    

## 2020-11-18 ENCOUNTER — Other Ambulatory Visit: Payer: Self-pay

## 2020-11-18 ENCOUNTER — Encounter: Payer: Self-pay | Admitting: Physician Assistant

## 2020-11-18 ENCOUNTER — Telehealth: Payer: BC Managed Care – PPO | Admitting: Physician Assistant

## 2020-11-18 DIAGNOSIS — J0140 Acute pansinusitis, unspecified: Secondary | ICD-10-CM

## 2020-11-18 MED ORDER — AMOXICILLIN-POT CLAVULANATE 875-125 MG PO TABS
1.0000 | ORAL_TABLET | Freq: Two times a day (BID) | ORAL | 0 refills | Status: DC
  Filled 2020-11-18: qty 20, 10d supply, fill #0

## 2020-11-18 MED ORDER — PREDNISONE 20 MG PO TABS
20.0000 mg | ORAL_TABLET | Freq: Every day | ORAL | 0 refills | Status: DC
  Filled 2020-11-18: qty 5, 5d supply, fill #0

## 2020-11-18 NOTE — Patient Instructions (Signed)
Sinusitis, Adult Sinusitis is inflammation of your sinuses. Sinuses are hollow spaces in the bones around your face. Your sinuses are located:  Around your eyes.  In the middle of your forehead.  Behind your nose.  In your cheekbones. Mucus normally drains out of your sinuses. When your nasal tissues become inflamed or swollen, mucus can become trapped or blocked. This allows bacteria, viruses, and fungi to grow, which leads to infection. Most infections of the sinuses are caused by a virus. Sinusitis can develop quickly. It can last for up to 4 weeks (acute) or for more than 12 weeks (chronic). Sinusitis often develops after a cold. What are the causes? This condition is caused by anything that creates swelling in the sinuses or stops mucus from draining. This includes:  Allergies.  Asthma.  Infection from bacteria or viruses.  Deformities or blockages in your nose or sinuses.  Abnormal growths in the nose (nasal polyps).  Pollutants, such as chemicals or irritants in the air.  Infection from fungi (rare). What increases the risk? You are more likely to develop this condition if you:  Have a weak body defense system (immune system).  Do a lot of swimming or diving.  Overuse nasal sprays.  Smoke. What are the signs or symptoms? The main symptoms of this condition are pain and a feeling of pressure around the affected sinuses. Other symptoms include:  Stuffy nose or congestion.  Thick drainage from your nose.  Swelling and warmth over the affected sinuses.  Headache.  Upper toothache.  A cough that may get worse at night.  Extra mucus that collects in the throat or the back of the nose (postnasal drip).  Decreased sense of smell and taste.  Fatigue.  A fever.  Sore throat.  Bad breath. How is this diagnosed? This condition is diagnosed based on:  Your symptoms.  Your medical history.  A physical exam.  Tests to find out if your condition is  acute or chronic. This may include: ? Checking your nose for nasal polyps. ? Viewing your sinuses using a device that has a light (endoscope). ? Testing for allergies or bacteria. ? Imaging tests, such as an MRI or CT scan. In rare cases, a bone biopsy may be done to rule out more serious types of fungal sinus disease. How is this treated? Treatment for sinusitis depends on the cause and whether your condition is chronic or acute.  If caused by a virus, your symptoms should go away on their own within 10 days. You may be given medicines to relieve symptoms. They include: ? Medicines that shrink swollen nasal passages (topical intranasal decongestants). ? Medicines that treat allergies (antihistamines). ? A spray that eases inflammation of the nostrils (topical intranasal corticosteroids). ? Rinses that help get rid of thick mucus in your nose (nasal saline washes).  If caused by bacteria, your health care provider may recommend waiting to see if your symptoms improve. Most bacterial infections will get better without antibiotic medicine. You may be given antibiotics if you have: ? A severe infection. ? A weak immune system.  If caused by narrow nasal passages or nasal polyps, you may need to have surgery. Follow these instructions at home: Medicines  Take, use, or apply over-the-counter and prescription medicines only as told by your health care provider. These may include nasal sprays.  If you were prescribed an antibiotic medicine, take it as told by your health care provider. Do not stop taking the antibiotic even if you start   to feel better. Hydrate and humidify  Drink enough fluid to keep your urine pale yellow. Staying hydrated will help to thin your mucus.  Use a cool mist humidifier to keep the humidity level in your home above 50%.  Inhale steam for 10-15 minutes, 3-4 times a day, or as told by your health care provider. You can do this in the bathroom while a hot shower is  running.  Limit your exposure to cool or dry air.   Rest  Rest as much as possible.  Sleep with your head raised (elevated).  Make sure you get enough sleep each night. General instructions  Apply a warm, moist washcloth to your face 3-4 times a day or as told by your health care provider. This will help with discomfort.  Wash your hands often with soap and water to reduce your exposure to germs. If soap and water are not available, use hand sanitizer.  Do not smoke. Avoid being around people who are smoking (secondhand smoke).  Keep all follow-up visits as told by your health care provider. This is important.   Contact a health care provider if:  You have a fever.  Your symptoms get worse.  Your symptoms do not improve within 10 days. Get help right away if:  You have a severe headache.  You have persistent vomiting.  You have severe pain or swelling around your face or eyes.  You have vision problems.  You develop confusion.  Your neck is stiff.  You have trouble breathing. Summary  Sinusitis is soreness and inflammation of your sinuses. Sinuses are hollow spaces in the bones around your face.  This condition is caused by nasal tissues that become inflamed or swollen. The swelling traps or blocks the flow of mucus. This allows bacteria, viruses, and fungi to grow, which leads to infection.  If you were prescribed an antibiotic medicine, take it as told by your health care provider. Do not stop taking the antibiotic even if you start to feel better.  Keep all follow-up visits as told by your health care provider. This is important. This information is not intended to replace advice given to you by your health care provider. Make sure you discuss any questions you have with your health care provider. Document Revised: 12/16/2017 Document Reviewed: 12/16/2017 Elsevier Patient Education  2021 [employer]  

## 2020-11-18 NOTE — Progress Notes (Signed)
Ms. Jill, Cook are scheduled for a virtual visit with your provider today.    Just as we do with appointments in the office, we must obtain your consent to participate.  Your consent will be active for this visit and any virtual visit you may have with one of our providers in the next 365 days.    If you have a MyChart account, I can also send a copy of this consent to you electronically.  All virtual visits are billed to your insurance company just like a traditional visit in the office.  As this is a virtual visit, video technology does not allow for your provider to perform a traditional examination.  This may limit your provider's ability to fully assess your condition.  If your provider identifies any concerns that need to be evaluated in person or the need to arrange testing such as labs, EKG, etc, we will make arrangements to do so.    Although advances in technology are sophisticated, we cannot ensure that it will always work on either your end or our end.  If the connection with a video visit is poor, we may have to switch to a telephone visit.  With either a video or telephone visit, we are not always able to ensure that we have a secure connection.   I need to obtain your verbal consent now.   Are you willing to proceed with your visit today?   Jill Cook has provided verbal consent on 11/18/2020 for a virtual visit (video or telephone).   Jill Daring, PA-C 11/18/2020  1:09 PM      MyChart Video Visit    Virtual Visit via Video Note   This visit type was conducted due to national recommendations for restrictions regarding the COVID-19 Pandemic (e.g. social distancing) in an effort to limit this patient's exposure and mitigate transmission in our community. This patient is at least at moderate risk for complications without adequate follow up. This format is felt to be most appropriate for this patient at this time. Physical exam was limited by quality of the video  and audio technology used for the visit.   Patient location: Home Provider location: Home office in [place]  I discussed the limitations of evaluation and management by telemedicine and the availability of in person appointments. The patient expressed understanding and agreed to proceed.  Patient: Jill Cook   DOB: 30-Apr-1966   55 y.o. Female  MRN: [id number] Visit Date: 11/18/2020  Today's healthcare provider: Mar Daring, PA-C   No chief complaint on file.  Subjective    Sinusitis This is a new problem. The current episode started in the past 7 days. The problem has been gradually worsening since onset. Maximum temperature: sunday night had low grade fever of 99.4. She is experiencing no pain. Associated symptoms include congestion, coughing, ear pain (ears feel full), headaches, a hoarse voice, shortness of breath, a sore throat and swollen glands (swollen on the left side). Pertinent negatives include no chills, sinus pressure or sneezing. Past treatments include acetaminophen (mucinex, alka seltzer cold and flu). The treatment provided no relief.    Patient was treated on Saturday, 11/12/20, for possible bacterial conjunctivitis. This improved with treatment. Then was seen yesterday via evisits for suspected viral sinusitis. Since then, today her symptoms have progressed and changed to include laryngitis, change in mucous consistency and color, and include swollen lymph nodes on the left side.  Patient Active Problem List   Diagnosis Date Noted  .  History of endometriosis 04/15/2020  . Essential hypertension 05/11/2019  . Palpitations 05/11/2019  . Bilateral carpal tunnel syndrome 01/08/2019  . Polyp of colon 01/08/2019  . Family history of colorectal cancer 01/08/2019  . Obese 08/23/2015  . 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 Generalized anxiety disorder 08/17/2015  . Depression 08/17/2015  . Gastroesophageal reflux disease with esophagitis  without hemorrhage 10/14/2008  . Irritable bowel syndrome 04/12/2008   Past Medical History:  Diagnosis Date  . Anal fissure   . Anemia   . Anxiety   . Arthritis    RIGHT HIP AND WRIST  . Colon polyps 2017  . Diverticulosis   . GERD (gastroesophageal reflux disease)    OTC PRN  . History of esophagitis   . Hypertension   . OA (osteoarthritis) of knee    RIGHT      Medications: Outpatient Medications Prior to Visit  Medication Sig  . albuterol (VENTOLIN HFA) 108 (90 Base) MCG/ACT inhaler INHALE 2 PUFFS INTO THE LUNGS EVERY 4 HOURS AS NEEDED FOR WHEEZING OR SHORTNESS OF BREATH.  Marland Kitchen. ALPRAZolam (XANAX) 0.5 MG tablet Take 1 tablet (0.5 mg total) by mouth at bedtime as needed for anxiety.  Marland Kitchen. aspirin EC 81 MG tablet Take 81 mg by mouth daily.  . benzonatate (TESSALON) 100 MG capsule TAKE 1 CAPSULE BY MOUTH 2 TIMES DAILY AS NEEDED FOR COUGH.  Marland Kitchen. BIOTIN PO Take by mouth.  Marland Kitchen. buPROPion (WELLBUTRIN XL) 300 MG 24 hr tablet Take 1 tablet (300 mg total) by mouth daily.  . Calcium Carb-Cholecalciferol (CALCIUM-VITAMIN D) 600-400 MG-UNIT TABS Take 1 tablet by mouth daily.  . cetirizine (ZYRTEC) 10 MG tablet Take 10 mg by mouth daily.  . famotidine (PEPCID) 20 MG tablet TAKE 1 TABLET BY MOUTH DAILY.  . fluticasone (FLONASE) 50 MCG/ACT nasal spray Place 2 sprays into both nostrils daily.  . hydrOXYzine (ATARAX/VISTARIL) 10 MG tablet Take 1 tablet (10 mg total) by mouth 3 (three) times daily as needed.  . hyoscyamine (LEVSIN) 0.125 MG tablet Take 1 tablet (0.125 mg total) by mouth every 4 (four) hours as needed.  Marland Kitchen. ipratropium (ATROVENT) 0.03 % nasal spray Place 2 sprays into both nostrils every 12 (twelve) hours.  . metoprolol succinate (TOPROL-XL) 25 MG 24 hr tablet Take 1 tablet (25 mg total) by mouth daily.  Marland Kitchen. ofloxacin (OCUFLOX) 0.3 % ophthalmic solution Place 1 drop into the left eye 4 (four) times daily. X 5 days  . valACYclovir (VALTREX) 500 MG tablet TAKE 1 TABLET BY MOUTH TWICE DAILY FOR 3  DAYS.   No facility-administered medications prior to visit.    Review of Systems  Constitutional: Negative for chills, fatigue and fever.  HENT: Positive for congestion, ear pain (ears feel full), hoarse voice, postnasal drip and sore throat. Negative for sinus pressure, sneezing and trouble swallowing (feels tight).   Respiratory: Positive for cough, chest tightness and shortness of breath.   Cardiovascular: Negative.   Neurological: Positive for headaches.    Last CBC Lab Results  Component Value Date   WBC 4.4 10/19/2020   HGB 12.5 10/19/2020   HCT 36.5 10/19/2020   MCV 84.7 10/19/2020   MCH 29.0 10/19/2020   RDW 13.2 10/19/2020   PLT 150 10/19/2020   Last metabolic panel Lab Results  Component Value Date   GLUCOSE 104 (H) 10/19/2020   NA 139 10/19/2020   K 3.7 10/19/2020   CL 105 10/19/2020   CO2 27 10/19/2020   BUN 12 10/19/2020  CREATININE 0.86 10/19/2020   GFRNONAA >60 10/19/2020   GFRAA 71 03/20/2019   CALCIUM 9.1 10/19/2020   PROT 6.4 03/20/2019   ALBUMIN 4.1 06/03/2017   BILITOT 0.4 03/20/2019   ALKPHOS 59 06/03/2017   AST 16 03/20/2019   ALT 16 03/20/2019   ANIONGAP 7 10/19/2020      Objective    There were no vitals taken for this visit. BP Readings from Last 3 Encounters:  10/19/20 137/85  04/15/20 130/72  06/03/19 140/81   Wt Readings from Last 3 Encounters:  10/19/20 195 lb (88.5 kg)  04/15/20 202 lb 12.8 oz (92 kg)  06/03/19 219 lb (99.3 kg)      Physical Exam Vitals reviewed.  Constitutional:      General: She is not in acute distress.    Appearance: Normal appearance. She is well-developed. She is not ill-appearing.  HENT:     Head: Normocephalic and atraumatic.  Pulmonary:     Effort: Pulmonary effort is normal. No respiratory distress (hoarse voice, but able to speak in full sentences).  Musculoskeletal:     Cervical back: Normal range of motion and neck supple.  Neurological:     Mental Status: She is alert.   Psychiatric:        Behavior: Behavior normal.        Thought Content: Thought content normal.        Judgment: Judgment normal.       Assessment & Plan     1. Acute non-recurrent pansinusitis - Worsening symptoms that have not responded to OTC medications.  - Will give augmentin and prednisone as below. Continue allergy medications.  - Stay well hydrated and get plenty of rest.  - Call if no symptom improvement or if symptoms worsen. - amoxicillin-clavulanate (AUGMENTIN) 875-125 MG tablet; Take 1 tablet by mouth 2 (two) times daily.  Dispense: 20 tablet; Refill: 0 - predniSONE (DELTASONE) 20 MG tablet; Take 1 tablet (20 mg total) by mouth daily with breakfast.  Dispense: 5 tablet; Refill: 0   No follow-ups on file.     I discussed the assessment and treatment plan with the patient. The patient was provided an opportunity to ask questions and all were answered. The patient agreed with the plan and demonstrated an understanding of the instructions.   The patient was advised to call back or seek an in-person evaluation if the symptoms worsen or if the condition fails to improve as anticipated.  I provided 14 minutes of face-to-face time during this encounter via MyChart Video enabled encounter.   [place] [phone] (phone) [phone] (fax)  [place]

## 2020-12-20 ENCOUNTER — Telehealth: Payer: BC Managed Care – PPO | Admitting: Nurse Practitioner

## 2020-12-20 ENCOUNTER — Other Ambulatory Visit: Payer: Self-pay

## 2020-12-20 DIAGNOSIS — J329 Chronic sinusitis, unspecified: Secondary | ICD-10-CM

## 2020-12-20 MED ORDER — DOXYCYCLINE HYCLATE 100 MG PO TABS
100.0000 mg | ORAL_TABLET | Freq: Two times a day (BID) | ORAL | 0 refills | Status: AC
  Filled 2020-12-20: qty 20, 10d supply, fill #0

## 2020-12-20 NOTE — Progress Notes (Signed)
We are sorry that you are not feeling well.     Based on what you have shared with me it looks like you have sinusitis.   Since you were treated for similar symptoms one month ago this is considered recurrent.   Sinusitis is inflammation and infection in the sinus cavities of the head.  .   We will plan to use a different antibiotic this time in order to help prevent antibiotic resistance. I have prescribed Doxycycline 100mg  by mouth twice a day for 10 days.   As you mentioned continue using your inhaler. You were also on steroids last month and recurrently using steroids is not good for your immune system and has many other side effects as well.   We will start with step treatment and start with antibiotics and inhaler use.  You may use an oral decongestant such as Mucinex D or if you have glaucoma or high blood pressure use plain Mucinex. Saline nasal spray help and can safely be used as often as needed for congestion.  If you develop worsening sinus pain, fever or notice severe headache and vision changes, or if symptoms are not better after completion of antibiotic, please schedule an appointment with a health care provider.    Sinus infections are not as easily transmitted as other respiratory infection, however we still recommend that you avoid close contact with loved ones, especially the very young and elderly.  Remember to wash your hands thoroughly throughout the day as this is the number one way to prevent the spread of infection!  Home Care:  Only take medications as instructed by your medical team.  Complete the entire course of an antibiotic.  Do not take these medications with alcohol.  A steam or ultrasonic humidifier can help congestion.  You can place a towel over your head and breathe in the steam from hot water coming from a faucet.  Avoid close contacts especially the very young and the elderly.  Cover your mouth when you cough or sneeze.  Always remember to  wash your hands.  Get Help Right Away If:  You develop worsening fever or sinus pain.  You develop a severe head ache or visual changes.  Your symptoms persist after you have completed your treatment plan.  Make sure you  Understand these instructions.  Will watch your condition.  Will get help right away if you are not doing well or get worse.  Your e-visit answers were reviewed by a board certified advanced clinical practitioner to complete your personal care plan.  Depending on the condition, your plan could have included both over the counter or prescription medications.  If there is a problem please reply  once you have received a response from your provider.  Your safety is important to [place].  If you have drug allergies check your prescription carefully.    You can use MyChart to ask questions about today's visit, request a non-urgent call back, or ask for a work or school excuse for 24 hours related to this e-Visit. If it has been greater than 24 hours you will need to follow up with your provider, or enter a new e-Visit to address those concerns.  You will get an e-mail in the next two days asking about your experience.  I hope that your e-visit has been valuable and will speed your recovery. Thank you for using e-visits.  I spent 10 minutes reviewing this patient's chart, history, recent visits and medications along with planning her  care.   Meds ordered this encounter  Medications  . doxycycline (VIBRA-TABS) 100 MG tablet    Sig: Take 1 tablet (100 mg total) by mouth 2 (two) times daily for 10 days.    Dispense:  20 tablet    Refill:  0

## 2021-01-12 ENCOUNTER — Telehealth: Payer: BC Managed Care – PPO | Admitting: Physician Assistant

## 2021-01-12 DIAGNOSIS — J329 Chronic sinusitis, unspecified: Secondary | ICD-10-CM

## 2021-01-12 NOTE — Progress Notes (Signed)
Based on what you shared with me, I feel your condition warrants further evaluation and I recommend that you be seen for a face to face visit.  Please contact your primary care physician practice to be seen. Many offices offer virtual options to be seen via video if you are not comfortable going in person to a medical facility at this time.  Giving that your symptoms continue to recur quickly despite multiple treatments you need in person evaluation and possibly imaging of the sinuses to make sure there is not an underlying cause of these frequent/recurrent infections. This is above the scope of what we can continue to treat via e-visit.   If you do not have a PCP, [facility] offers a free physician referral service available at [phone]. Our trained staff has the experience, knowledge and resources to put you in touch with a physician who is right for you.   You also have the option of a video visit through [email or web address]  If you are having a true medical emergency please call 911.  NOTE: If you entered your credit card information for this eVisit, you will not be charged. You may see a "hold" on your card for the $35 but that hold will drop off and you will not have a charge processed.  Your e-visit answers were reviewed by a board certified advanced clinical practitioner to complete your personal care plan.  Thank you for using e-Visits.

## 2021-01-16 NOTE — Progress Notes (Signed)
Name: Jill Cook   MRN: [id number]    DOB: 1966-06-11   Date:01/17/2021       Progress Note  Subjective  Chief Complaint  Acute visit for URI  HPI  GERD: she was on Omeprazole, but was given Pepcid and it has worked well for her. She has been hoarse most of the time since October of last year, but she states thinks secondary to cough and post-nasal drainage . We will resume PPI for now, since hoarseness has been bothersome   Sinusitis: she had a cold back in the Fall but since March she has seen multiple doctors for similar symptoms of hoarseness, cough that at times is dry and other times productive, head congestion and post-nasal drainage. No fever or chills, no sob or wheezing. Never smoked    Depression/Anxiety: she states very seldom has panic attacks, she stopped all her medications and is doing well now, states frustrated about her health but emotionally doing okay without medications    HTN : she weaned self off Metoprolol, bp is at goal and no recurrence of palpitation       Patient Active Problem List   Diagnosis Date Noted   History of endometriosis 04/15/2020   Essential hypertension 05/11/2019   Palpitations 05/11/2019   Bilateral carpal tunnel syndrome 01/08/2019   Polyp of colon 01/08/2019   Family history of colorectal cancer 01/08/2019   Obese 08/23/2015   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 08/22/2015   Generalized anxiety disorder 08/17/2015   Depression 08/17/2015   Gastroesophageal reflux disease with esophagitis without hemorrhage 10/14/2008   Irritable bowel syndrome 04/12/2008    Past Surgical History:  Procedure Laterality Date   ANAL FISSURE REPAIR  06-13-2000   BUNIONECTOMY Right [place]   COLONOSCOPY WITH PROPOFOL N/A 06/03/2019   Procedure: COLONOSCOPY WITH PROPOFOL;  Surgeon: Virgel Manifold, MD;  Location: [facility];  Service: Endoscopy;  Laterality: N/A;   CONVERSION TO TOTAL KNEE Right 08/22/2015    Procedure: partial knee CONVERSION TO RIGHT TOTAL KNEE;  Surgeon: Paralee Cancel, MD;  Location: [facility];  Service: Orthopedics;  Laterality: Right;   D & C HYSTEROSCOPY /  NOVASURE ENDOMETRIAL ABLATION  11-15-2005   ESOPHAGOGASTRODUODENOSCOPY (EGD) WITH PROPOFOL N/A 06/03/2019   Procedure: ESOPHAGOGASTRODUODENOSCOPY (EGD) WITH PROPOFOL;  Surgeon: Virgel Manifold, MD;  Location: [facility];  Service: Endoscopy;  Laterality: N/A;   KNEE ARTHROSCOPY W/ MENISCECTOMY Right    KNEE ARTHROSCOPY WITH MEDIAL MENISECTOMY Right 10/06/2013   Procedure: RIGHT KNEE ARTHROSCOPY WITH CHONDROPLASTY;  Surgeon: Sydnee Cabal, MD;  Location: [facility];  Service: Orthopedics;  Laterality: Right;   LAPAROSCOPY N/A 11/01/2015   Procedure: LAPAROSCOPY DIAGNOSTIC;  Surgeon: Molli Posey, MD;  Location: [facility];  Service: Gynecology;  Laterality: N/A;   RIGHT KNEE PATELLOFEMORAL ARTHROPLASTY  06-10-2008   SEPTOPLASTY  2000   TONSILLECTOMY  AGE 55   TRANSTHORACIC ECHOCARDIOGRAM  06-03-2008   NORMAL /  EF 60%   TUBAL LIGATION  1996    Family History  Problem Relation Age of Onset   Hypertension Mother    Thyroid disease Mother    Atrial fibrillation Mother    Stroke Mother    Heart disease Father    Heart attack Father 56   Hypertension Paternal Grandfather    Diabetes Paternal Grandfather    Uterine cancer Paternal Aunt    Colon cancer Maternal Aunt        dx in  her 34's   Esophageal cancer Maternal Grandfather     Social History   Tobacco Use   Smoking status: Never   Smokeless tobacco: Never  Substance Use Topics   Alcohol use: Yes    Comment: 1 drink per month     Current Outpatient Medications:    albuterol (VENTOLIN HFA) 108 (90 Base) MCG/ACT inhaler, INHALE 2 PUFFS INTO THE LUNGS EVERY 4 HOURS AS NEEDED FOR WHEEZING OR SHORTNESS OF BREATH., Disp: 18 g, Rfl: 0   aspirin EC 81 MG tablet, Take 81 mg by mouth daily., Disp: , Rfl:    benzonatate  (TESSALON) 100 MG capsule, Take 1-2 capsules (100-200 mg total) by mouth 3 (three) times daily as needed., Disp: 60 capsule, Rfl: 0   cetirizine (ZYRTEC) 10 MG tablet, Take 10 mg by mouth daily., Disp: , Rfl:    ipratropium (ATROVENT) 0.03 % nasal spray, Place 2 sprays into both nostrils every 12 (twelve) hours., Disp: 30 mL, Rfl: 0   Multiple Vitamin (MULTIVITAMIN) tablet, Take 1 tablet by mouth daily., Disp: , Rfl:    omeprazole (PRILOSEC) 40 MG capsule, Take 1 capsule (40 mg total) by mouth daily., Disp: 90 capsule, Rfl: 0   valACYclovir (VALTREX) 500 MG tablet, TAKE 1 TABLET BY MOUTH TWICE DAILY FOR 3 DAYS. (Patient not taking: Reported on 01/17/2021), Disp: 6 tablet, Rfl: 0  Allergies  Allergen Reactions   Codeine Nausea And Vomiting   Hydrocodone Nausea Only   Morphine Hives and Nausea And Vomiting    I personally reviewed active problem list, medication list, allergies, family history, social history, health maintenance, notes from last encounter with the patient/caregiver today.   ROS  Constitutional: Negative for fever or weight change.  Respiratory: positive for cough but no shortness of breath.   Cardiovascular: Negative for chest pain or palpitations.  Gastrointestinal: Negative for abdominal pain, no bowel changes.  Musculoskeletal: Negative for gait problem or joint swelling.  Skin: Negative for rash.  Neurological: Negative for dizziness or headache.  No other specific complaints in a complete review of systems (except as listed in HPI above).   Objective  Vitals:   01/17/21 1411  BP: 120/72  Pulse: 84  Resp: 16  Temp: 98.2 F (36.8 C)  TempSrc: Oral  SpO2: 95%  Weight: 213 lb (96.6 kg)  Height: 5\' 5"  (1.651 m)    Body mass index is 35.45 kg/m.  Physical Exam  Constitutional: Patient appears well-developed and well-nourished. Obese  No distress.  HEENT: head atraumatic, normocephalic, pupils equal and reactive to light,neck supple Cardiovascular: Normal  rate, regular rhythm and normal heart sounds.  No murmur heard. No BLE edema. Pulmonary/Chest: Effort normal and breath sounds normal. No respiratory distress. Abdominal: Soft.  There is no tenderness. Psychiatric: Patient has a normal mood and affect. behavior is normal. Judgment and thought content normal.   PHQ2/9: Depression screen [facility] 2/9 01/17/2021 04/15/2020 05/11/2019 03/20/2019 01/08/2019  Decreased Interest 0 0 0 0 0  Down, Depressed, Hopeless 0 0 0 0 1  PHQ - 2 Score 0 0 0 0 1  Altered sleeping 1 - 0 1 1  Tired, decreased energy 1 - 1 1 2   Change in appetite 0 - 0 0 0  Feeling bad or failure about yourself  0 - 0 0 1  Trouble concentrating 0 - 0 0 0  Moving slowly or fidgety/restless 0 - 0 0 0  Suicidal thoughts 0 - 0 0 0  PHQ-9 Score 2 - 1 2 5  Difficult doing work/chores - - Not difficult at all Not difficult at all Not difficult at all    phq 9 is negative   Fall Risk: Fall Risk  01/17/2021 04/15/2020 05/11/2019 03/20/2019 01/08/2019  Falls in the past year? 1 0 0 0 0  Number falls in past yr: 1 0 0 0 0  Injury with Fall? 0 0 0 0 0  Follow up - - Falls evaluation completed - -     Functional Status Survey: Is the patient deaf or have difficulty hearing?: No Does the patient have difficulty seeing, even when wearing glasses/contacts?: No Does the patient have difficulty concentrating, remembering, or making decisions?: No Does the patient have difficulty walking or climbing stairs?: No Does the patient have difficulty dressing or bathing?: No Does the patient have difficulty doing errands alone such as visiting a doctor's office or shopping?: No   Assessment & Plan  1. Chronic recurrent sinusitis  - Ambulatory referral to ENT  2. Hoarseness of voice  - Ambulatory referral to ENT  3. Gastroesophageal reflux disease with esophagitis without hemorrhage  - omeprazole (PRILOSEC) 40 MG capsule; Take 1 capsule (40 mg total) by mouth daily.  Dispense: 90 capsule;  Refill: 0  4. Cough  - benzonatate (TESSALON) 100 MG capsule; Take 1-2 capsules (100-200 mg total) by mouth 3 (three) times daily as needed.  Dispense: 60 capsule; Refill: 0

## 2021-01-17 ENCOUNTER — Other Ambulatory Visit: Payer: Self-pay

## 2021-01-17 ENCOUNTER — Ambulatory Visit: Payer: BC Managed Care – PPO | Admitting: Family Medicine

## 2021-01-17 ENCOUNTER — Encounter: Payer: Self-pay | Admitting: Family Medicine

## 2021-01-17 VITALS — BP 120/72 | HR 84 | Temp 98.2°F | Resp 16 | Ht 65.0 in | Wt 213.0 lb

## 2021-01-17 DIAGNOSIS — K2100 Gastro-esophageal reflux disease with esophagitis, without bleeding: Secondary | ICD-10-CM

## 2021-01-17 DIAGNOSIS — R490 Dysphonia: Secondary | ICD-10-CM

## 2021-01-17 DIAGNOSIS — R059 Cough, unspecified: Secondary | ICD-10-CM

## 2021-01-17 DIAGNOSIS — J329 Chronic sinusitis, unspecified: Secondary | ICD-10-CM | POA: Diagnosis not present

## 2021-01-17 MED ORDER — BENZONATATE 100 MG PO CAPS
100.0000 mg | ORAL_CAPSULE | Freq: Three times a day (TID) | ORAL | 0 refills | Status: DC | PRN
  Filled 2021-01-17: qty 60, 10d supply, fill #0

## 2021-01-17 MED ORDER — OMEPRAZOLE 40 MG PO CPDR
40.0000 mg | DELAYED_RELEASE_CAPSULE | Freq: Every day | ORAL | 0 refills | Status: DC
  Filled 2021-01-17: qty 90, 90d supply, fill #0

## 2021-01-26 ENCOUNTER — Telehealth: Payer: BC Managed Care – PPO | Admitting: Physician Assistant

## 2021-01-26 DIAGNOSIS — U071 COVID-19: Secondary | ICD-10-CM

## 2021-01-26 MED ORDER — MOLNUPIRAVIR EUA 200MG CAPSULE: 4.0000 | ORAL_CAPSULE | Freq: Two times a day (BID) | ORAL | 0 refills | Status: AC

## 2021-01-26 NOTE — Patient Instructions (Addendum)
[facility], thank you for joining Leeanne Rio, PA-C for today's virtual visit.  While this provider is not your primary care provider (PCP), if your PCP is located in our provider database this encounter information will be shared with them immediately following your visit.  Consent: (Patient) Jill Cook provided verbal consent for this virtual visit at the beginning of the encounter.  Current Medications:  Current Outpatient Medications:    molnupiravir EUA 200 mg CAPS, Take 4 capsules (800 mg total) by mouth 2 (two) times daily for 5 days., Disp: 40 capsule, Rfl: 0   albuterol (VENTOLIN HFA) 108 (90 Base) MCG/ACT inhaler, INHALE 2 PUFFS INTO THE LUNGS EVERY 4 HOURS AS NEEDED FOR WHEEZING OR SHORTNESS OF BREATH., Disp: 18 g, Rfl: 0   aspirin EC 81 MG tablet, Take 81 mg by mouth daily., Disp: , Rfl:    benzonatate (TESSALON) 100 MG capsule, Take 1-2 capsules (100-200 mg total) by mouth 3 (three) times daily as needed., Disp: 60 capsule, Rfl: 0   cetirizine (ZYRTEC) 10 MG tablet, Take 10 mg by mouth daily., Disp: , Rfl:    ipratropium (ATROVENT) 0.03 % nasal spray, Place 2 sprays into both nostrils every 12 (twelve) hours., Disp: 30 mL, Rfl: 0   Multiple Vitamin (MULTIVITAMIN) tablet, Take 1 tablet by mouth daily., Disp: , Rfl:    omeprazole (PRILOSEC) 40 MG capsule, Take 1 capsule (40 mg total) by mouth daily., Disp: 90 capsule, Rfl: 0   valACYclovir (VALTREX) 500 MG tablet, TAKE 1 TABLET BY MOUTH TWICE DAILY FOR 3 DAYS. (Patient not taking: Reported on 01/17/2021), Disp: 6 tablet, Rfl: 0   Medications ordered in this encounter:  Meds ordered this encounter  Medications   molnupiravir EUA 200 mg CAPS    Sig: Take 4 capsules (800 mg total) by mouth 2 (two) times daily for 5 days.    Dispense:  40 capsule    Refill:  0    Order Specific Question:   Supervising Provider    Answer:   Sabra Heck, [place]     *If you need refills on other medications prior to your  next appointment, please contact your pharmacy*  Follow-Up: Call back or seek an in-person evaluation if the symptoms worsen or if the condition fails to improve as anticipated.  Other Instructions Please keep well-hydrated and get plenty of rest. Start a saline nasal rinse to flush out your nasal passages. You should consider starting an OTC nasal steroid like Flonase once daily.  You can use plain Mucinex to help thin congestion. If you have a humidifier, running in the bedroom at night. I want you to start OTC vitamin D3 1000 units daily, vitamin C 1000 mg daily, and a zinc supplement. Please take prescribed medications as directed.  You have been enrolled in a MyChart symptom monitoring program. Please answer these questions daily so we can keep track of how you are doing.  You were to quarantine for 5 days from onset of your symptoms.  After day 5, if you have had no fever and you are feeling better, you can end quarantine but need to mask for an additional 5 days. After day 5 if you have a fever or are having significant symptoms, please quarantine for full 10 days.  If you note any worsening of symptoms, any significant shortness of breath or any chest pain, please seek ER evaluation ASAP.  Please do not delay care!  If you have been instructed to have an  in-person evaluation today at a local Urgent Care facility, please use the link below. It will take you to a list of all of our available [facility], including address, phone number and hours of operation. Please do not delay care.  [facility]  If you or a family member do not have a primary care provider, use the link below to schedule a visit and establish care. When you choose a [facility] primary care physician or advanced practice provider, you gain a long-term partner in health. Find a Primary Care Provider  Learn more about [facility]'s in-office and virtual care options: [facility]

## 2021-01-26 NOTE — Progress Notes (Signed)
Virtual Visit Consent   [facility]iza [facility], you are scheduled for a virtual visit with a [facility] provider today.     Just as with appointments in the office, your consent must be obtained to participate.  Your consent will be active for this visit and any virtual visit you may have with one of our providers in the next 365 days.     If you have a MyChart account, a copy of this consent can be sent to you electronically.  All virtual visits are billed to your insurance company just like a traditional visit in the office.    As this is a virtual visit, video technology does not allow for your provider to perform a traditional examination.  This may limit your provider's ability to fully assess your condition.  If your provider identifies any concerns that need to be evaluated in person or the need to arrange testing (such as labs, EKG, etc.), we will make arrangements to do so.     Although advances in technology are sophisticated, we cannot ensure that it will always work on either your end or our end.  If the connection with a video visit is poor, the visit may have to be switched to a telephone visit.  With either a video or telephone visit, we are not always able to ensure that we have a secure connection.     I need to obtain your verbal consent now.   Are you willing to proceed with your visit today?    Jill Cook has provided verbal consent on 01/26/2021 for a virtual visit (video or telephone).   Leeanne Rio, [place]   Date: 01/26/2021 12:13 PM  Virtual Visit via Video Note   I, Leeanne Rio, connected with  Jill Cook  ([id number], 17-Sep-1965) on 01/26/21 at 12:00 PM EDT by a video-enabled telemedicine application and verified that I am speaking with the correct person using two identifiers.  Location: Patient: Virtual Visit Location Patient: Home Provider: Virtual Visit Location Provider: Home Office   I discussed the limitations of evaluation  and management by telemedicine and the availability of in person appointments. The patient expressed understanding and agreed to proceed.    History of Present Illness: Jill Cook is a 55 y.o. who identifies as a female who was assigned female at birth, and is being seen today for + COVID 19 test and associated symptoms. Patient endorses that symptom onset was Tuesday of this week -- Headache, sore throat, sinus pressure, nasal congestion, cough which is dry.  Endorses low-grade fever (worse at night) and body aches. Denies chest pain, chest tightness or SOB. Found out about exposure yesterday that happened last weekend. Had two positive home COVID tests yesterday. Has taken Tylenol to help with headache which has been somewhat beneficial.   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   Generalized anxiety disorder 08/17/2015   Depression 08/17/2015   Gastroesophageal reflux disease with esophagitis without hemorrhage 10/14/2008   Irritable bowel syndrome 04/12/2008    Allergies:  Allergies  Allergen Reactions   Codeine Nausea And Vomiting   Hydrocodone Nausea Only   Morphine Hives and  Nausea And Vomiting   Medications:  Current Outpatient Medications:    molnupiravir EUA 200 mg CAPS, Take 4 capsules (800 mg total) by mouth 2 (two) times daily for 5 days., Disp: 40 capsule, Rfl: 0   albuterol (VENTOLIN HFA) 108 (90 Base) MCG/ACT inhaler, INHALE 2 PUFFS INTO THE LUNGS EVERY 4 HOURS AS NEEDED FOR WHEEZING OR SHORTNESS OF BREATH., Disp: 18 g, Rfl: 0   aspirin EC 81 MG tablet, Take 81 mg by mouth daily., Disp: , Rfl:    benzonatate (TESSALON) 100 MG capsule, Take  1-2 capsules (100-200 mg total) by mouth 3 (three) times daily as needed., Disp: 60 capsule, Rfl: 0   cetirizine (ZYRTEC) 10 MG tablet, Take 10 mg by mouth daily., Disp: , Rfl:    ipratropium (ATROVENT) 0.03 % nasal spray, Place 2 sprays into both nostrils every 12 (twelve) hours., Disp: 30 mL, Rfl: 0   Multiple Vitamin (MULTIVITAMIN) tablet, Take 1 tablet by mouth daily., Disp: , Rfl:    omeprazole (PRILOSEC) 40 MG capsule, Take 1 capsule (40 mg total) by mouth daily., Disp: 90 capsule, Rfl: 0   valACYclovir (VALTREX) 500 MG tablet, TAKE 1 TABLET BY MOUTH TWICE DAILY FOR 3 DAYS. (Patient not taking: Reported on 01/17/2021), Disp: 6 tablet, Rfl: 0  Observations/Objective: Patient is well-developed, well-nourished in no acute distress.  Resting comfortably at home.  Head is normocephalic, atraumatic.  No labored breathing.  Speech is clear and coherent with logical content.  Patient is alert and oriented at baseline.   Assessment and Plan: 1. COVID-19 - molnupiravir EUA 200 mg CAPS; Take 4 capsules (800 mg total) by mouth 2 (two) times daily for 5 days.  Dispense: 40 capsule; Refill: 0 - MyChart COVID-19 home monitoring program; Future Recent onset of symptoms.  Positive home test.  Patient experiencing mild to moderate symptoms.  Giving her BMI and cardiac history she is a candidate for antiviral therapy.  Discussed pros and cons of antiviral medication.  Also discussed potential adverse reactions.  Patient would like to proceed.  Rx sent for Molnupiravir.  Supportive measures, OTC medications and vitamin regimen reviewed in detail with patient.  Quarantine reviewed with patient.  Patient has been enrolled in a MyChart symptom monitoring program.  She is answer these questionnaires daily so we can keep track of her symptoms.  To ER precautions reviewed with patient who voiced understanding and agreement.  Follow Up Instructions: I discussed the assessment and treatment plan with the patient. The  patient was provided an opportunity to ask questions and all were answered. The patient agreed with the plan and demonstrated an understanding of the instructions.  A copy of instructions were sent to the patient via MyChart.  The patient was advised to call back or seek an in-person evaluation if the symptoms worsen or if the condition fails to improve as anticipated.  Time:  I spent 15 minutes with the patient via telehealth technology discussing the above problems/concerns.    Leeanne Rio, PA-C

## 2021-02-23 ENCOUNTER — Other Ambulatory Visit: Payer: Self-pay

## 2021-02-23 MED ORDER — TRIAMCINOLONE ACETONIDE 55 MCG/ACT NA AERO: INHALATION_SPRAY | NASAL | 11 refills | Status: DC

## 2021-02-23 MED ORDER — PREDNISONE 10 MG PO TABS
ORAL_TABLET | ORAL | 0 refills | Status: DC
  Filled 2021-02-23: qty 21, 6d supply, fill #0

## 2021-02-23 MED ORDER — CEFDINIR 300 MG PO CAPS
ORAL_CAPSULE | ORAL | 0 refills | Status: DC
  Filled 2021-02-23: qty 42, 21d supply, fill #0

## 2021-02-23 MED ORDER — AZELASTINE HCL 0.1 % NA SOLN
NASAL | 11 refills | Status: DC
  Filled 2021-02-23: qty 30, 30d supply, fill #0

## 2021-02-24 ENCOUNTER — Other Ambulatory Visit: Payer: Self-pay

## 2021-05-29 ENCOUNTER — Ambulatory Visit: Payer: Self-pay

## 2021-05-29 NOTE — Telephone Encounter (Signed)
Pt c/o elevated BP and 1/10 headache. Sx began Saturday and lasted all weekend. Systolic BP: 370-488 and Diastolic BP 89-16. Care advice given and pt verbalized understanding. Pt given appt tomorrow am with Shela Commons DO.       Reason for Disposition  Systolic BP  >= 945 OR Diastolic >= 038  Answer Assessment - Initial Assessment Questions 1. BLOOD PRESSURE: "What is the blood pressure?" "Did you take at least two measurements 5 minutes apart?"     Since Saturday BP's have ranged 156-164/94-96 2. ONSET: "When did you take your blood pressure?"     15 minutes before triage call 3. HOW: "How did you obtain the blood pressure?" (e.g., visiting nurse, automatic home BP monitor)     Manual cuff 4. HISTORY: "Do you have a history of high blood pressure?"     yes 5. MEDICATIONS: "Are you taking any medications for blood pressure?" "Have you missed any doses recently?" N/a 6. OTHER SYMPTOMS: "Do you have any symptoms?" (e.g., headache, chest pain, blurred vision, difficulty breathing, weakness)     Headache 1/10  7. PREGNANCY: "Is there any chance you are pregnant?" "When was your last menstrual period?"     N/a  Protocols used: Blood Pressure - High-A-AH

## 2021-05-30 ENCOUNTER — Other Ambulatory Visit: Payer: Self-pay

## 2021-05-30 ENCOUNTER — Encounter: Payer: Self-pay | Admitting: Family Medicine

## 2021-05-30 ENCOUNTER — Ambulatory Visit: Payer: BC Managed Care – PPO | Admitting: Family Medicine

## 2021-05-30 VITALS — BP 152/96 | HR 90 | Temp 98.2°F | Resp 16 | Ht 65.0 in | Wt 208.7 lb

## 2021-05-30 DIAGNOSIS — I10 Essential (primary) hypertension: Secondary | ICD-10-CM | POA: Diagnosis not present

## 2021-05-30 DIAGNOSIS — Z1231 Encounter for screening mammogram for malignant neoplasm of breast: Secondary | ICD-10-CM

## 2021-05-30 DIAGNOSIS — Z23 Encounter for immunization: Secondary | ICD-10-CM | POA: Diagnosis not present

## 2021-05-30 MED ORDER — LOSARTAN POTASSIUM 25 MG PO TABS
25.0000 mg | ORAL_TABLET | Freq: Every day | ORAL | 0 refills | Status: DC
  Filled 2021-05-30: qty 90, 90d supply, fill #0

## 2021-05-30 NOTE — Progress Notes (Signed)
   SUBJECTIVE:   CHIEF COMPLAINT / HPI:   Hypertension: - h/o intermittently elevated BP - higher BP since Saturday (SBP 150-170s) with headache. - Medications: none (previously self weaned off of metoprolol). Previously on Lisinopril but with dry cough - Compliance: n/a - Checking BP at home: yes - reports headache, nervousness, some palpitations. HR within 80s. Some LE edema with standing.  - Denies any SOB, CP, vision changes, PND, orthopnea.  - denies any changes in diet, stressors. - starting new exercise program soon.   OBJECTIVE:   BP (!) 152/96   Pulse 90   Temp 98.2 F (36.8 C)   Resp 16   Ht 5\' 5"  (1.651 m)   Wt 208 lb 11.2 oz (94.7 kg)   SpO2 97%   BMI 34.73 kg/m   Gen: well appearing, in NAD Card: RRR Lungs: CTAB Ext: WWP, no edema   ASSESSMENT/PLAN:   Essential hypertension Elevated today. Will start low dose ARB. Obtaining labs today. Monitor BP at home and return in 1 month with log.     Myles Gip, DO

## 2021-05-30 NOTE — Assessment & Plan Note (Signed)
Elevated today. Will start low dose ARB. Obtaining labs today. Monitor BP at home and return in 1 month with log.

## 2021-05-30 NOTE — Patient Instructions (Signed)
It was great to see you!  Our plans for today:  - Take the losartan as prescribed.  - Keep an eye on your blood pressure at home and bring your log with you at your follow up. - Come back in 1 month.   We are checking some labs today, we will release these results to your MyChart.  Take care and seek immediate care sooner if you develop any concerns.   Dr. Ky Barban

## 2021-05-31 ENCOUNTER — Other Ambulatory Visit: Payer: Self-pay

## 2021-05-31 ENCOUNTER — Encounter: Payer: Self-pay | Admitting: Emergency Medicine

## 2021-05-31 ENCOUNTER — Emergency Department: Payer: BC Managed Care – PPO

## 2021-05-31 ENCOUNTER — Emergency Department
Admission: EM | Admit: 2021-05-31 | Discharge: 2021-06-01 | Disposition: A | Discharge status: Home / Self Care | Payer: BC Managed Care – PPO | Attending: Emergency Medicine | Admitting: Emergency Medicine

## 2021-05-31 DIAGNOSIS — R209 Unspecified disturbances of skin sensation: Secondary | ICD-10-CM | POA: Insufficient documentation

## 2021-05-31 DIAGNOSIS — Z96651 Presence of right artificial knee joint: Secondary | ICD-10-CM | POA: Diagnosis not present

## 2021-05-31 DIAGNOSIS — I10 Essential (primary) hypertension: Secondary | ICD-10-CM | POA: Diagnosis present

## 2021-05-31 DIAGNOSIS — R202 Paresthesia of skin: Secondary | ICD-10-CM

## 2021-05-31 DIAGNOSIS — Z79899 Other long term (current) drug therapy: Secondary | ICD-10-CM | POA: Diagnosis not present

## 2021-05-31 DIAGNOSIS — Z7982 Long term (current) use of aspirin: Secondary | ICD-10-CM | POA: Diagnosis not present

## 2021-05-31 LAB — BASIC METABOLIC PANEL
Anion gap: 7 (ref 5–15)
BUN: 11 mg/dL (ref 7–25)
BUN: 13 mg/dL (ref 6–20)
CO2: 29 mmol/L (ref 20–32)
CO2: 27 mmol/L (ref 22–32)
Calcium: 10 mg/dL (ref 8.6–10.4)
Calcium: 9.1 mg/dL (ref 8.9–10.3)
Chloride: 102 mmol/L (ref 98–110)
Chloride: 104 mmol/L (ref 98–111)
Creat: 0.94 mg/dL (ref 0.50–1.03)
Creatinine, Ser: 0.81 mg/dL (ref 0.44–1.00)
GFR, Estimated: >60 mL/min (ref >60)
Glucose, Bld: 94 mg/dL (ref 65–99)
Glucose, Bld: 92 mg/dL (ref 70–99)
Potassium: 4.2 mmol/L (ref 3.5–5.3)
Potassium: 3.8 mmol/L (ref 3.5–5.1)
Sodium: 140 mmol/L (ref 135–146)
Sodium: 138 mmol/L (ref 135–145)

## 2021-05-31 LAB — URINALYSIS, ROUTINE W REFLEX MICROSCOPIC
Bacteria, UA: NONE SEEN
Bilirubin Urine: NEGATIVE
Glucose, UA: NEGATIVE mg/dL
Ketones, ur: NEGATIVE mg/dL
Leukocytes,Ua: NEGATIVE
Nitrite: NEGATIVE
Protein, ur: NEGATIVE mg/dL
Specific Gravity, Urine: 1.019 (ref 1.005–1.030)
WBC, UA: NONE SEEN WBC/hpf (ref 0–5)
pH: 6 (ref 5.0–8.0)

## 2021-05-31 LAB — CBC
HCT: 36.9 % (ref 36.0–46.0)
Hemoglobin: 13.4 g/dL (ref 12.0–15.0)
MCH: 31.2 pg (ref 26.0–34.0)
MCHC: 36.3 g/dL — ABNORMAL HIGH (ref 30.0–36.0)
MCV: 85.8 fL (ref 80.0–100.0)
Platelets: 165 10*3/uL (ref 150–400)
RBC: 4.3 MIL/uL (ref 3.87–5.11)
RDW: 12.9 % (ref 11.5–15.5)
WBC: 5.7 10*3/uL (ref 4.0–10.5)
nRBC: 0 % (ref 0.0–0.2)

## 2021-05-31 LAB — TSH: TSH: 1.62 mIU/L

## 2021-05-31 LAB — CBG MONITORING, ED: Glucose-Capillary: 69 mg/dL — ABNORMAL LOW (ref 70–99)

## 2021-05-31 LAB — POC URINE PREG, ED: Preg Test, Ur: NEGATIVE

## 2021-05-31 MED ORDER — ASPIRIN 81 MG PO CHEW
324.0000 mg | CHEWABLE_TABLET | Freq: Once | ORAL | Status: AC
  Administered 2021-05-31: 324 mg via ORAL
  Filled 2021-05-31: qty 4

## 2021-05-31 MED ORDER — IOHEXOL 350 MG/ML SOLN
75.0000 mL | Freq: Once | INTRAVENOUS | Status: AC | PRN
  Administered 2021-05-31: 75 mL via INTRAVENOUS

## 2021-05-31 NOTE — ED Notes (Signed)
Pt provided a phone to speak with MRI.

## 2021-05-31 NOTE — ED Notes (Signed)
Pt to MRI

## 2021-05-31 NOTE — ED Provider Notes (Signed)
[facility] Emergency Department Provider Note   ____________________________________________   Event Date/Time   First MD Initiated Contact with Patient 05/31/21 1740     (approximate)  I have reviewed the triage vital signs and the nursing notes.   HISTORY  Chief Complaint Hypertension    HPI Jill Cook is a 55 y.o. female with a history of hypertension  She works as a Marine scientist.  She was noticing that she was feeling as though she was having high blood pressure, she checked her blood pressure and it was elevated over 200.  This occurred this morning perhaps around 10 AM  She then noticed that she started feeling tingling over her right face right arm and right leg.  This prompted her to come the ER for evaluation  No speech changes no headache.  No chest pain or trouble breathing.  She is not experienced any weakness or vision changes but just feels a slight sense of a tingling over the right side   Past Medical History:  Diagnosis Date   Anal fissure    Anemia    Anxiety    Arthritis    RIGHT HIP AND WRIST   Colon polyps 2017   Diverticulosis    GERD (gastroesophageal reflux disease)    OTC PRN   History of esophagitis    Hypertension    OA (osteoarthritis) of knee    RIGHT    Patient Active Problem List   Diagnosis Date Noted   History of endometriosis 04/15/2020   Essential hypertension 05/11/2019   Palpitations 05/11/2019   Bilateral carpal tunnel syndrome 01/08/2019   Polyp of colon 01/08/2019   Family history of colorectal cancer 01/08/2019   Obese 08/23/2015   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 08/22/2015   Generalized anxiety disorder 08/17/2015   Depression 08/17/2015   Gastroesophageal reflux disease with esophagitis without hemorrhage 10/14/2008   Irritable bowel syndrome 04/12/2008    Past Surgical History:  Procedure Laterality Date   ANAL FISSURE REPAIR  06-13-2000   BUNIONECTOMY Right [place]   COLONOSCOPY WITH PROPOFOL N/A 06/03/2019   Procedure: COLONOSCOPY WITH PROPOFOL;  Surgeon: Virgel Manifold, MD;  Location: [facility] ENDOSCOPY;  Service: Endoscopy;  Laterality: N/A;   CONVERSION TO TOTAL KNEE Right 08/22/2015   Procedure: partial knee CONVERSION TO RIGHT TOTAL KNEE;  Surgeon: Paralee Cancel, MD;  Location: [facility];  Service: Orthopedics;  Laterality: Right;   D & C HYSTEROSCOPY /  NOVASURE ENDOMETRIAL ABLATION  11-15-2005   ESOPHAGOGASTRODUODENOSCOPY (EGD) WITH PROPOFOL N/A 06/03/2019   Procedure: ESOPHAGOGASTRODUODENOSCOPY (EGD) WITH PROPOFOL;  Surgeon: Virgel Manifold, MD;  Location: [facility] ENDOSCOPY;  Service: Endoscopy;  Laterality: N/A;   KNEE ARTHROSCOPY W/ MENISCECTOMY Right    KNEE ARTHROSCOPY WITH MEDIAL MENISECTOMY Right 10/06/2013   Procedure: RIGHT KNEE ARTHROSCOPY WITH CHONDROPLASTY;  Surgeon: Sydnee Cabal, MD;  Location: [facility];  Service: Orthopedics;  Laterality: Right;   LAPAROSCOPY N/A 11/01/2015   Procedure: LAPAROSCOPY DIAGNOSTIC;  Surgeon: Molli Posey, MD;  Location: [facility];  Service: Gynecology;  Laterality: N/A;   RIGHT KNEE PATELLOFEMORAL ARTHROPLASTY  06-10-2008   SEPTOPLASTY  2000   TONSILLECTOMY  AGE 82   TRANSTHORACIC ECHOCARDIOGRAM  06-03-2008   NORMAL /  EF 60%   TUBAL LIGATION  1996    Prior to Admission medications   Medication Sig Start Date End Date Taking? Authorizing Provider  aspirin EC 81 MG tablet Take 81  mg by mouth daily.    [provider]  cetirizine (ZYRTEC) 10 MG tablet Take 10 mg by mouth daily. Patient not taking: Reported on 05/30/2021    [provider]  ipratropium (ATROVENT) 0.03 % nasal spray Place 2 sprays into both nostrils every 12 (twelve) hours. Patient not taking: Reported on 05/30/2021 11/17/20   Brunetta Jeans, PA-C  losartan (COZAAR) 25 MG tablet Take 1 tablet (25 mg total) by mouth daily. 05/30/21   Myles Gip, DO  Multiple Vitamin  (MULTIVITAMIN) tablet Take 1 tablet by mouth daily. Patient not taking: Reported on 05/30/2021    [provider]  omeprazole (PRILOSEC) 40 MG capsule Take 1 capsule (40 mg total) by mouth daily. Patient not taking: Reported on 05/30/2021 01/17/21   Steele Sizer, MD  triamcinolone (NASACORT) 55 MCG/ACT AERO nasal inhaler Use 2 sprays in each nostril daily Patient not taking: Reported on 05/30/2021 02/23/21     valACYclovir (VALTREX) 500 MG tablet TAKE 1 TABLET BY MOUTH TWICE DAILY FOR 3 DAYS. 07/26/20 07/26/21  Muthersbaugh, Jarrett Soho, PA-C    Allergies Codeine, Hydrocodone, and Morphine  Family History  Problem Relation Age of Onset   Hypertension Mother    Thyroid disease Mother    Atrial fibrillation Mother    Stroke Mother    Heart disease Father    Heart attack Father 41   Hypertension Paternal Grandfather    Diabetes Paternal Grandfather    Uterine cancer Paternal Aunt    Colon cancer Maternal Aunt        dx in her 41's   Esophageal cancer Maternal Grandfather     Social History Social History   Tobacco Use   Smoking status: Never   Smokeless tobacco: Never  Vaping Use   Vaping Use: Never used  Substance Use Topics   Alcohol use: Yes    Comment: 1 drink per month   Drug use: No    Review of Systems Constitutional: No fever/chills Eyes: No visual changes. ENT: No sore throat. Cardiovascular: Denies chest pain. Respiratory: Denies shortness of breath. Gastrointestinal: No abdominal pain.   Musculoskeletal: Negative for back pain. Skin: Negative for rash. Neurological: Negative for headaches or weakness    ____________________________________________   PHYSICAL EXAM:  VITAL SIGNS: ED Triage Vitals  Enc Vitals Group     BP 05/31/21 1141 (!) 159/89     Pulse Rate 05/31/21 1141 87     Resp 05/31/21 1141 16     Temp 05/31/21 1141 98.1 F (36.7 C)     Temp Source 05/31/21 1141 Oral     SpO2 05/31/21 1141 100 %     Weight 05/31/21 1142 205 lb (93  kg)     Height 05/31/21 1142 5\' 6"  (1.676 m)     Head Circumference --      Peak Flow --      Pain Score 05/31/21 1141 0     Pain Loc --      Pain Edu? --      Excl. in [place]? --     Constitutional: Alert and oriented. Well appearing and in no acute distress. Eyes: Conjunctivae are normal. Head: Atraumatic. Nose: No congestion/rhinnorhea. Mouth/Throat: Mucous membranes are moist. Neck: No stridor.  Cardiovascular: Normal rate, regular rhythm. Grossly normal heart sounds.  Good peripheral circulation. Respiratory: Normal respiratory effort.  No retractions. Lungs CTAB. Gastrointestinal: Soft and nontender. No distention. Musculoskeletal: No lower extremity tenderness nor edema. Neurologic:  Normal speech and language. No gross focal neurologic  deficits are appreciated. NIH =1 (patient reports a very light sense of diminished sensation (not dense) R hand and R arm) Skin:  Skin is warm, dry and intact. No rash noted. Psychiatric: Mood and affect are normal. Speech and behavior are normal.  ____________________________________________   LABS (all labs ordered are listed, but only abnormal results are displayed)  Labs Reviewed  CBC - Abnormal; Notable for the following components:      Result Value   MCHC 36.3 (*)    All other components within normal limits  URINALYSIS, ROUTINE W REFLEX MICROSCOPIC - Abnormal; Notable for the following components:   Color, Urine YELLOW (*)    APPearance CLEAR (*)    Hgb urine dipstick SMALL (*)    All other components within normal limits  CBG MONITORING, ED - Abnormal; Notable for the following components:   Glucose-Capillary 69 (*)    All other components within normal limits  BASIC METABOLIC PANEL  CBG MONITORING, ED  POC URINE PREG, ED   ____________________________________________  EKG  ED ECG REPORT I, Delman Kitten, the attending physician, personally viewed and interpreted this ECG.  Date: 06/03/2021 EKG Time: 2345 Rate:  Normal Rhythm: normal sinus rhythm QRS Axis: normal Intervals: normal ST/T Wave abnormalities: normal Narrative Interpretation: no evidence of acute ischemia  ____________________________________________  RADIOLOGY  CT ANGIO HEAD NECK W WO CM  Result Date: 05/31/2021 CLINICAL DATA:  Left face and hand tingling EXAM: CT ANGIOGRAPHY HEAD AND NECK TECHNIQUE: Multidetector CT imaging of the head and neck was performed using the standard protocol during bolus administration of intravenous contrast. Multiplanar CT image reconstructions and MIPs were obtained to evaluate the vascular anatomy. Carotid stenosis measurements (when applicable) are obtained utilizing NASCET criteria, using the distal internal carotid diameter as the denominator. CONTRAST:  36mL OMNIPAQUE IOHEXOL 350 MG/ML SOLN COMPARISON:  MRI 05/31/2021 FINDINGS: CTA NECK FINDINGS SKELETON: There is no bony spinal canal stenosis. No lytic or blastic lesion. OTHER NECK: Normal pharynx, larynx and major salivary glands. No cervical lymphadenopathy. Unremarkable thyroid gland. UPPER CHEST: No pneumothorax or pleural effusion. No nodules or masses. AORTIC ARCH: There is no calcific atherosclerosis of the aortic arch. There is no aneurysm, dissection or hemodynamically significant stenosis of the visualized portion of the aorta. Conventional 3 vessel aortic branching pattern. The visualized proximal subclavian arteries are widely patent. RIGHT CAROTID SYSTEM: Normal without aneurysm, dissection or stenosis. LEFT CAROTID SYSTEM: Normal without aneurysm, dissection or stenosis. VERTEBRAL ARTERIES: Left dominant configuration. Both origins are clearly patent. There is no dissection, occlusion or flow-limiting stenosis to the skull base (V1-V3 segments). CTA HEAD FINDINGS POSTERIOR CIRCULATION: --Vertebral arteries: Normal V4 segments. --Inferior cerebellar arteries: Normal. --Basilar artery: Normal. --Superior cerebellar arteries: Normal. --Posterior  cerebral arteries (PCA): Normal. ANTERIOR CIRCULATION: --Intracranial internal carotid arteries: Normal. --Anterior cerebral arteries (ACA): Normal. Both A1 segments are present. Patent anterior communicating artery (a-comm). --Middle cerebral arteries (MCA): Normal. VENOUS SINUSES: As permitted by contrast timing, patent. ANATOMIC VARIANTS: None Review of the MIP images confirms the above findings. IMPRESSION: Normal CTA of the head and neck. Electronically Signed   By: Ulyses Jarred M.D.   On: 05/31/2021 23:38   CT HEAD WO CONTRAST  Result Date: 05/31/2021 CLINICAL DATA:  Paresthesias, LEFT facial and LEFT hand tingling, history hypertension EXAM: CT HEAD WITHOUT CONTRAST TECHNIQUE: Contiguous axial images were obtained from the base of the skull through the vertex without intravenous contrast. COMPARISON:  None FINDINGS: Brain: Mild generalized atrophy. Normal ventricular morphology. No midline shift or mass  effect. Otherwise normal appearance of brain parenchyma. No intracranial hemorrhage, mass lesion or evidence of acute infarction. No extra-axial fluid collections. Vascular: No hyperdense vessels. Skull: Intact Sinuses/Orbits: Clear Other: N/A IMPRESSION: Mild generalized atrophy. No acute intracranial abnormalities. Electronically Signed   By: Lavonia Dana M.D.   On: 05/31/2021 13:09   MR BRAIN WO CONTRAST  Result Date: 05/31/2021 CLINICAL DATA:  Numbness or tingling, paresthesia (Ped 0-18y) left face/arm tingling EXAM: MRI HEAD WITHOUT CONTRAST TECHNIQUE: Multiplanar, multiecho pulse sequences of the brain and surrounding structures were obtained without intravenous contrast. COMPARISON:  None. FINDINGS: Brain: No acute infarct, mass effect or extra-axial collection. No acute or chronic hemorrhage. Normal white matter signal, parenchymal volume and CSF spaces. The midline structures are normal. Vascular: Major flow voids are preserved. Skull and upper cervical spine: Normal calvarium and skull base.  Visualized upper cervical spine and soft tissues are normal. Sinuses/Orbits:No paranasal sinus fluid levels or advanced mucosal thickening. No mastoid or middle ear effusion. Normal orbits. IMPRESSION: Normal brain MRI. Electronically Signed   By: Ulyses Jarred M.D.   On: 05/31/2021 21:41     MRI brain reviewed negative for acute finding.  CT head reviewed negative for acute finding CT angiography reveals normal CTA head neck ____________________________________________   PROCEDURES  Procedure(s) performed: None  Procedures  Critical Care performed: No  ____________________________________________   INITIAL IMPRESSION / ASSESSMENT AND PLAN / ED COURSE  Pertinent labs & imaging results that were available during my care of the patient were reviewed by me and considered in my medical decision making (see chart for details).   Patient presents with hypertension.  Recently diagnosed and started antihypertensive.  Check blood pressure at work and noticed it was quite high, then noted to feel tingling over the right side.  Objectively she does not appear to have any obvious deficits on exam but she does did note a paresthesia over the right side for which I have awarded 1 NIH point.  Overall the likelihood of acute stroke seems low and her blood pressure is actually improved itself after she had taken her home medication.  Symptoms are improving.  It is unclear but certainly hypertensive emergency could be considered, or urgency also concern for TIA or stroke.  I discussed the case with Dr. Leonel Ramsay of neurology he advised obtaining CTA of the head and neck, as well as MRI of the brain.  With these both being negative reasonable to discharge the patient with aspirin treatment and close outpatient follow-up at this point.  Her blood pressure is markedly improved she feels well and is actually asymptomatic at the time of disposition and discharge.  She will continue on aspirin therapy and follow-up  closely with PCP as well as neurology.  Clinical Course as of 06/03/21 2147  Wed May 31, 2021  1826 ABCD2 score = 3, low risk [MQ]    Clinical Course User Index [MQ] Delman Kitten, MD    Return precautions and treatment recommendations and follow-up discussed with the patient who is agreeable with the plan.  ____________________________________________   FINAL CLINICAL IMPRESSION(S) / ED DIAGNOSES  Final diagnoses:  Hypertension, unspecified type  Paresthesia        Note:  This document was prepared using Dragon voice recognition software and may include unintentional dictation errors       Delman Kitten, MD 06/03/21 2148

## 2021-05-31 NOTE — ED Notes (Signed)
Pt to CT

## 2021-05-31 NOTE — ED Triage Notes (Signed)
Pt to ED via POV with co hypertension and left facial tingling and left hand began to tingle also. Her CBG is 69 at this time. She has been seen by her PMD for her HTN and was given Losartin for this and started this today. Her B/P at home was 220/110. It has since came down here.

## 2021-05-31 NOTE — ED Notes (Signed)
Pt to ED because was concerned that was hypertensive this morning,  around 1100 today BP was around 210/110 and at this same time had L facial heaviness, L lip tingling and L hand tingling. This lasted about 1 hour then resolved. Pt states did not experience weakness or facial droop. Pt also had HA at this same time, on top part of head, which also resolved.  Pt has new rx for losartan. (started yesterday) and she took this morning (before high reading at 1100). Pt has hx HTN but was able to wean off BP meds about 1.5 years ago with PCP approval.  Hx stroke in family. Pt is Therapist, sports.

## 2021-05-31 NOTE — Discharge Instructions (Signed)
Please continue your current medications. In addition, please start aspirin 325mg  by mouth daily.  Follow-up with Dr. Ancil Boozer and schedule a visit with neurology (Dr. Melrose Nakayama)

## 2021-06-01 NOTE — ED Notes (Signed)
E-signature pad unavailable - Pt verbalized understanding of D/C information - no additional concerns at this time.  

## 2021-06-26 NOTE — Progress Notes (Deleted)
Name: Jill Cook   MRN: [id number]    DOB: Dec 27, 1965   Date:06/26/2021       Progress Note  Subjective  Chief Complaint  Follow Up  HPI  GERD: she was on Omeprazole, but was given Pepcid and it has worked well for her. She has been hoarse most of the time since October of last year, but she states thinks secondary to cough and post-nasal drainage . We will resume PPI for now, since hoarseness has been bothersome   Sinusitis: she had a cold back in the Fall but since March she has seen multiple doctors for similar symptoms of hoarseness, cough that at times is dry and other times productive, head congestion and post-nasal drainage. No fever or chills, no sob or wheezing. Never smoked    Depression/Anxiety: she states very seldom has panic attacks, she stopped all her medications and is doing well now, states frustrated about her health but emotionally doing okay without medications    HTN : she weaned self off Metoprolol, bp is at goal and no recurrence of palpitation   Patient Active Problem List   Diagnosis Date Noted   History of endometriosis 04/15/2020   Essential hypertension 05/11/2019   Palpitations 05/11/2019   Bilateral carpal tunnel syndrome 01/08/2019   Polyp of colon 01/08/2019   Family history of colorectal cancer 01/08/2019   Obese 08/23/2015   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 08/22/2015   Generalized anxiety disorder 08/17/2015   Depression 08/17/2015   Gastroesophageal reflux disease with esophagitis without hemorrhage 10/14/2008   Irritable bowel syndrome 04/12/2008    Past Surgical History:  Procedure Laterality Date   ANAL FISSURE REPAIR  06-13-2000   BUNIONECTOMY Right [place]   COLONOSCOPY WITH PROPOFOL N/A 06/03/2019   Procedure: COLONOSCOPY WITH PROPOFOL;  Surgeon: Virgel Manifold, MD;  Location: [facility];  Service: Endoscopy;  Laterality: N/A;   CONVERSION TO TOTAL KNEE Right 08/22/2015   Procedure:  partial knee CONVERSION TO RIGHT TOTAL KNEE;  Surgeon: Paralee Cancel, MD;  Location: [facility];  Service: Orthopedics;  Laterality: Right;   D & C HYSTEROSCOPY /  NOVASURE ENDOMETRIAL ABLATION  11-15-2005   ESOPHAGOGASTRODUODENOSCOPY (EGD) WITH PROPOFOL N/A 06/03/2019   Procedure: ESOPHAGOGASTRODUODENOSCOPY (EGD) WITH PROPOFOL;  Surgeon: Virgel Manifold, MD;  Location: [facility];  Service: Endoscopy;  Laterality: N/A;   KNEE ARTHROSCOPY W/ MENISCECTOMY Right    KNEE ARTHROSCOPY WITH MEDIAL MENISECTOMY Right 10/06/2013   Procedure: RIGHT KNEE ARTHROSCOPY WITH CHONDROPLASTY;  Surgeon: Sydnee Cabal, MD;  Location: [facility];  Service: Orthopedics;  Laterality: Right;   LAPAROSCOPY N/A 11/01/2015   Procedure: LAPAROSCOPY DIAGNOSTIC;  Surgeon: Molli Posey, MD;  Location: [facility];  Service: Gynecology;  Laterality: N/A;   RIGHT KNEE PATELLOFEMORAL ARTHROPLASTY  06-10-2008   SEPTOPLASTY  2000   TONSILLECTOMY  AGE 55   TRANSTHORACIC ECHOCARDIOGRAM  06-03-2008   NORMAL /  EF 60%   TUBAL LIGATION  1996    Family History  Problem Relation Age of Onset   Hypertension Mother    Thyroid disease Mother    Atrial fibrillation Mother    Stroke Mother    Heart disease Father    Heart attack Father 56   Hypertension Paternal Grandfather    Diabetes Paternal Grandfather    Uterine cancer Paternal Aunt    Colon cancer Maternal Aunt        dx in her 81's   Esophageal cancer  Maternal Grandfather     Social History   Tobacco Use   Smoking status: Never   Smokeless tobacco: Never  Substance Use Topics   Alcohol use: Yes    Comment: 1 drink per month     Current Outpatient Medications:    aspirin EC 81 MG tablet, Take 81 mg by mouth daily., Disp: , Rfl:    cetirizine (ZYRTEC) 10 MG tablet, Take 10 mg by mouth daily. (Patient not taking: Reported on 05/30/2021), Disp: , Rfl:    ipratropium (ATROVENT) 0.03 % nasal spray, Place 2 sprays into both nostrils  every 12 (twelve) hours. (Patient not taking: Reported on 05/30/2021), Disp: 30 mL, Rfl: 0   losartan (COZAAR) 25 MG tablet, Take 1 tablet (25 mg total) by mouth daily., Disp: 90 tablet, Rfl: 0   Multiple Vitamin (MULTIVITAMIN) tablet, Take 1 tablet by mouth daily. (Patient not taking: Reported on 05/30/2021), Disp: , Rfl:    omeprazole (PRILOSEC) 40 MG capsule, Take 1 capsule (40 mg total) by mouth daily. (Patient not taking: Reported on 05/30/2021), Disp: 90 capsule, Rfl: 0   triamcinolone (NASACORT) 55 MCG/ACT AERO nasal inhaler, Use 2 sprays in each nostril daily (Patient not taking: Reported on 05/30/2021), Disp: 32.4 mL, Rfl: 11   valACYclovir (VALTREX) 500 MG tablet, TAKE 1 TABLET BY MOUTH TWICE DAILY FOR 3 DAYS., Disp: 6 tablet, Rfl: 0  Allergies  Allergen Reactions   Codeine Nausea And Vomiting   Hydrocodone Nausea Only   Morphine Hives and Nausea And Vomiting    I personally reviewed active problem list, medication list, allergies, family history, social history, health maintenance with the patient/caregiver today.   ROS  ***  Objective  There were no vitals filed for this visit.  There is no height or weight on file to calculate BMI.  Physical Exam ***  Recent Results (from the past 2160 hour(s))  Basic Metabolic Panel (BMET)     Status: None   Collection Time: 05/30/21  9:05 AM  Result Value Ref Range   Glucose, Bld 94 65 - 99 mg/dL    Comment: .            Fasting reference interval .    BUN 11 7 - 25 mg/dL   Creat 0.94 0.50 - 1.03 mg/dL   BUN/Creatinine Ratio NOT APPLICABLE 6 - 22 (calc)   Sodium 140 135 - 146 mmol/L   Potassium 4.2 3.5 - 5.3 mmol/L   Chloride 102 98 - 110 mmol/L   CO2 29 20 - 32 mmol/L   Calcium 10.0 8.6 - 10.4 mg/dL  TSH     Status: None   Collection Time: 05/30/21  9:05 AM  Result Value Ref Range   TSH 1.62 mIU/L    Comment:           Reference Range .           > or = 20 Years  0.40-4.50 .                Pregnancy Ranges            First trimester    0.26-2.66           Second trimester   0.55-2.73           Third trimester    0.43-2.91   CBG monitoring, ED     Status: Abnormal   Collection Time: 05/31/21 11:37 AM  Result Value Ref Range   Glucose-Capillary 69 (L) 70 - 99 mg/dL  Comment: Glucose reference range applies only to samples taken after fasting for at least 8 hours.  Basic metabolic panel     Status: None   Collection Time: 05/31/21 11:43 AM  Result Value Ref Range   Sodium 138 135 - 145 mmol/L   Potassium 3.8 3.5 - 5.1 mmol/L   Chloride 104 98 - 111 mmol/L   CO2 27 22 - 32 mmol/L   Glucose, Bld 92 70 - 99 mg/dL    Comment: Glucose reference range applies only to samples taken after fasting for at least 8 hours.   BUN 13 6 - 20 mg/dL   Creatinine, Ser 0.81 0.44 - 1.00 mg/dL   Calcium 9.1 8.9 - 10.3 mg/dL   GFR, Estimated >60 >60 mL/min    Comment: (NOTE) Calculated using the CKD-EPI Creatinine Equation (2021)    Anion gap 7 5 - 15    Comment: Performed at [facility], [address] [place], [place] [address]  CBC     Status: Abnormal   Collection Time: 05/31/21 11:43 AM  Result Value Ref Range   WBC 5.7 4.0 - 10.5 K/uL   RBC 4.30 3.87 - 5.11 MIL/uL   Hemoglobin 13.4 12.0 - 15.0 g/dL   HCT 36.9 36.0 - 46.0 %   MCV 85.8 80.0 - 100.0 fL   MCH 31.2 26.0 - 34.0 pg   MCHC 36.3 (H) 30.0 - 36.0 g/dL   RDW 12.9 11.5 - 15.5 %   Platelets 165 150 - 400 K/uL   nRBC 0.0 0.0 - 0.2 %    Comment: Performed at [facility], [address] [place], [place] [address]  Urinalysis, Routine w reflex microscopic Urine, Clean Catch     Status: Abnormal   Collection Time: 05/31/21  7:00 PM  Result Value Ref Range   Color, Urine YELLOW (A) YELLOW   APPearance CLEAR (A) CLEAR   Specific Gravity, Urine 1.019 1.005 - 1.030   pH 6.0 5.0 - 8.0   Glucose, UA NEGATIVE NEGATIVE mg/dL   Hgb urine dipstick SMALL (A) NEGATIVE   Bilirubin Urine NEGATIVE NEGATIVE   Ketones, ur NEGATIVE NEGATIVE  mg/dL   Protein, ur NEGATIVE NEGATIVE mg/dL   Nitrite NEGATIVE NEGATIVE   Leukocytes,Ua NEGATIVE NEGATIVE   RBC / HPF 6-10 0 - 5 RBC/hpf   WBC, UA NONE SEEN 0 - 5 WBC/hpf   Bacteria, UA NONE SEEN NONE SEEN   Squamous Epithelial / LPF 0-5 0 - 5   Mucus PRESENT     Comment: Performed at [facility], [address] [place],  [address]  POC urine preg, ED     Status: None   Collection Time: 05/31/21  7:00 PM  Result Value Ref Range   Preg Test, Ur NEGATIVE NEGATIVE    Comment:        THE SENSITIVITY OF THIS METHODOLOGY IS >24 mIU/mL      PHQ2/9: Depression screen [facility] 2/9 05/30/2021 01/17/2021 04/15/2020 05/11/2019 03/20/2019  Decreased Interest 0 0 0 0 0  Down, Depressed, Hopeless 0 0 0 0 0  PHQ - 2 Score 0 0 0 0 0  Altered sleeping 0 1 - 0 1  Tired, decreased energy 0 1 - 1 1  Change in appetite 0 0 - 0 0  Feeling bad or failure about yourself  0 0 - 0 0  Trouble concentrating 0 0 - 0 0  Moving slowly or fidgety/restless 0 0 - 0 0  Suicidal thoughts 0 0 -  0 0  PHQ-9 Score 0 2 - 1 2  Difficult doing work/chores Not difficult at all - - Not difficult at all Not difficult at all    phq 9 is {gen pos [id number]}   Fall Risk: Fall Risk  05/30/2021 01/17/2021 04/15/2020 05/11/2019 03/20/2019  Falls in the past year? 0 1 0 0 0  Number falls in past yr: 0 1 0 0 0  Injury with Fall? 0 0 0 0 0  Follow up - - - Falls evaluation completed -      Functional Status Survey:      Assessment & Plan  *** There are no diagnoses linked to this encounter.

## 2021-06-27 ENCOUNTER — Ambulatory Visit: Payer: BC Managed Care – PPO | Admitting: Family Medicine

## 2021-07-03 ENCOUNTER — Ambulatory Visit: Payer: BC Managed Care – PPO | Admitting: Podiatry

## 2021-07-21 NOTE — Progress Notes (Deleted)
Name: Jill Cook   MRN: [id number]    DOB: [birth year]   Date:07/21/2021       Progress Note  Subjective  Chief Complaint  Follow Up  HPI  GERD: she was on Omeprazole, but was given Pepcid and it has worked well for her. She has been hoarse most of the time since October of last year, but she states thinks secondary to cough and post-nasal drainage . We will resume PPI for now, since hoarseness has been bothersome   Sinusitis: she had a cold back in the Fall but since March she has seen multiple doctors for similar symptoms of hoarseness, cough that at times is dry and other times productive, head congestion and post-nasal drainage. No fever or chills, no sob or wheezing. Never smoked    Depression/Anxiety: she states very seldom has panic attacks, she stopped all her medications and is doing well now, states frustrated about her health but emotionally doing okay without medications    HTN : she weaned self off Metoprolol, bp is at goal and no recurrence of palpitation   Patient Active Problem List   Diagnosis Date Noted   History of endometriosis 04/15/2020   Essential hypertension 05/11/2019   Palpitations 05/11/2019   Bilateral carpal tunnel syndrome 01/08/2019   Polyp of colon 01/08/2019   Family history of colorectal cancer 01/08/2019   Obese 08/23/2015   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 08/22/2015   Generalized anxiety disorder 08/17/2015   Depression 08/17/2015   Gastroesophageal reflux disease with esophagitis without hemorrhage 10/14/2008   Irritable bowel syndrome 04/12/2008    Past Surgical History:  Procedure Laterality Date   ANAL FISSURE REPAIR  06-13-2000   BUNIONECTOMY Right [place]   COLONOSCOPY WITH PROPOFOL N/A 06/03/2019   Procedure: COLONOSCOPY WITH PROPOFOL;  Surgeon: Virgel Manifold, MD;  Location: [facility];  Service: Endoscopy;  Laterality: N/A;   CONVERSION TO TOTAL KNEE Right 08/22/2015   Procedure:  partial knee CONVERSION TO RIGHT TOTAL KNEE;  Surgeon: Paralee Cancel, MD;  Location: [facility];  Service: Orthopedics;  Laterality: Right;   D & C HYSTEROSCOPY /  NOVASURE ENDOMETRIAL ABLATION  11-15-2005   ESOPHAGOGASTRODUODENOSCOPY (EGD) WITH PROPOFOL N/A 06/03/2019   Procedure: ESOPHAGOGASTRODUODENOSCOPY (EGD) WITH PROPOFOL;  Surgeon: Virgel Manifold, MD;  Location: [facility];  Service: Endoscopy;  Laterality: N/A;   KNEE ARTHROSCOPY W/ MENISCECTOMY Right    KNEE ARTHROSCOPY WITH MEDIAL MENISECTOMY Right 10/06/2013   Procedure: RIGHT KNEE ARTHROSCOPY WITH CHONDROPLASTY;  Surgeon: Sydnee Cabal, MD;  Location: [facility];  Service: Orthopedics;  Laterality: Right;   LAPAROSCOPY N/A 11/01/2015   Procedure: LAPAROSCOPY DIAGNOSTIC;  Surgeon: Molli Posey, MD;  Location: [facility];  Service: Gynecology;  Laterality: N/A;   RIGHT KNEE PATELLOFEMORAL ARTHROPLASTY  06-10-2008   SEPTOPLASTY  2000   TONSILLECTOMY  AGE 55   TRANSTHORACIC ECHOCARDIOGRAM  06-03-2008   NORMAL /  EF 60%   TUBAL LIGATION  1996    Family History  Problem Relation Age of Onset   Hypertension Mother    Thyroid disease Mother    Atrial fibrillation Mother    Stroke Mother    Heart disease Father    Heart attack Father 58   Hypertension Paternal Grandfather    Diabetes Paternal Grandfather    Uterine cancer Paternal Aunt    Colon cancer Maternal Aunt        dx in her 73's   Esophageal cancer  Maternal Grandfather     Social History   Tobacco Use   Smoking status: Never   Smokeless tobacco: Never  Substance Use Topics   Alcohol use: Yes    Comment: 1 drink per month     Current Outpatient Medications:    aspirin EC 81 MG tablet, Take 81 mg by mouth daily., Disp: , Rfl:    cetirizine (ZYRTEC) 10 MG tablet, Take 10 mg by mouth daily. (Patient not taking: Reported on 05/30/2021), Disp: , Rfl:    ipratropium (ATROVENT) 0.03 % nasal spray, Place 2 sprays into both nostrils  every 12 (twelve) hours. (Patient not taking: Reported on 05/30/2021), Disp: 30 mL, Rfl: 0   losartan (COZAAR) 25 MG tablet, Take 1 tablet (25 mg total) by mouth daily., Disp: 90 tablet, Rfl: 0   Multiple Vitamin (MULTIVITAMIN) tablet, Take 1 tablet by mouth daily. (Patient not taking: Reported on 05/30/2021), Disp: , Rfl:    omeprazole (PRILOSEC) 40 MG capsule, Take 1 capsule (40 mg total) by mouth daily. (Patient not taking: Reported on 05/30/2021), Disp: 90 capsule, Rfl: 0   triamcinolone (NASACORT) 55 MCG/ACT AERO nasal inhaler, Use 2 sprays in each nostril daily (Patient not taking: Reported on 05/30/2021), Disp: 32.4 mL, Rfl: 11   valACYclovir (VALTREX) 500 MG tablet, TAKE 1 TABLET BY MOUTH TWICE DAILY FOR 3 DAYS., Disp: 6 tablet, Rfl: 0  Allergies  Allergen Reactions   Codeine Nausea And Vomiting   Hydrocodone Nausea Only   Morphine Hives and Nausea And Vomiting    I personally reviewed active problem list, medication list, allergies, family history, social history, health maintenance with the patient/caregiver today.   ROS  ***  Objective  There were no vitals filed for this visit.  There is no height or weight on file to calculate BMI.  Physical Exam ***  Recent Results (from the past 2160 hour(s))  Basic Metabolic Panel (BMET)     Status: None   Collection Time: 05/30/21  9:05 AM  Result Value Ref Range   Glucose, Bld 94 65 - 99 mg/dL    Comment: .            Fasting reference interval .    BUN 11 7 - 25 mg/dL   Creat 0.94 0.50 - 1.03 mg/dL   BUN/Creatinine Ratio NOT APPLICABLE 6 - 22 (calc)   Sodium 140 135 - 146 mmol/L   Potassium 4.2 3.5 - 5.3 mmol/L   Chloride 102 98 - 110 mmol/L   CO2 29 20 - 32 mmol/L   Calcium 10.0 8.6 - 10.4 mg/dL  TSH     Status: None   Collection Time: 05/30/21  9:05 AM  Result Value Ref Range   TSH 1.62 mIU/L    Comment:           Reference Range .           > or = 20 Years  0.40-4.50 .                Pregnancy Ranges            First trimester    0.26-2.66           Second trimester   0.55-2.73           Third trimester    0.43-2.91   CBG monitoring, ED     Status: Abnormal   Collection Time: 05/31/21 11:37 AM  Result Value Ref Range   Glucose-Capillary 69 (L) 70 - 99 mg/dL  Comment: Glucose reference range applies only to samples taken after fasting for at least 8 hours.  Basic metabolic panel     Status: None   Collection Time: 05/31/21 11:43 AM  Result Value Ref Range   Sodium 138 135 - 145 mmol/L   Potassium 3.8 3.5 - 5.1 mmol/L   Chloride 104 98 - 111 mmol/L   CO2 27 22 - 32 mmol/L   Glucose, Bld 92 70 - 99 mg/dL    Comment: Glucose reference range applies only to samples taken after fasting for at least 8 hours.   BUN 13 6 - 20 mg/dL   Creatinine, Ser 0.81 0.44 - 1.00 mg/dL   Calcium 9.1 8.9 - 10.3 mg/dL   GFR, Estimated >60 >60 mL/min    Comment: (NOTE) Calculated using the CKD-EPI Creatinine Equation (2021)    Anion gap 7 5 - 15    Comment: Performed at [facility], [address] [place], [place] [address]  CBC     Status: Abnormal   Collection Time: 05/31/21 11:43 AM  Result Value Ref Range   WBC 5.7 4.0 - 10.5 K/uL   RBC 4.30 3.87 - 5.11 MIL/uL   Hemoglobin 13.4 12.0 - 15.0 g/dL   HCT 36.9 36.0 - 46.0 %   MCV 85.8 80.0 - 100.0 fL   MCH 31.2 26.0 - 34.0 pg   MCHC 36.3 (H) 30.0 - 36.0 g/dL   RDW 12.9 11.5 - 15.5 %   Platelets 165 150 - 400 K/uL   nRBC 0.0 0.0 - 0.2 %    Comment: Performed at [facility], [address] [place], [place] [address]  Urinalysis, Routine w reflex microscopic Urine, Clean Catch     Status: Abnormal   Collection Time: 05/31/21  7:00 PM  Result Value Ref Range   Color, Urine YELLOW (A) YELLOW   APPearance CLEAR (A) CLEAR   Specific Gravity, Urine 1.019 1.005 - 1.030   pH 6.0 5.0 - 8.0   Glucose, UA NEGATIVE NEGATIVE mg/dL   Hgb urine dipstick SMALL (A) NEGATIVE   Bilirubin Urine NEGATIVE NEGATIVE   Ketones, ur NEGATIVE NEGATIVE  mg/dL   Protein, ur NEGATIVE NEGATIVE mg/dL   Nitrite NEGATIVE NEGATIVE   Leukocytes,Ua NEGATIVE NEGATIVE   RBC / HPF 6-10 0 - 5 RBC/hpf   WBC, UA NONE SEEN 0 - 5 WBC/hpf   Bacteria, UA NONE SEEN NONE SEEN   Squamous Epithelial / LPF 0-5 0 - 5   Mucus PRESENT     Comment: Performed at [facility], [address] [place], [place] [address]  POC urine preg, ED     Status: None   Collection Time: 05/31/21  7:00 PM  Result Value Ref Range   Preg Test, Ur NEGATIVE NEGATIVE    Comment:        THE SENSITIVITY OF THIS METHODOLOGY IS >24 mIU/mL      PHQ2/9: Depression screen [facility] 2/9 05/30/2021 01/17/2021 04/15/2020 05/11/2019 03/20/2019  Decreased Interest 0 0 0 0 0  Down, Depressed, Hopeless 0 0 0 0 0  PHQ - 2 Score 0 0 0 0 0  Altered sleeping 0 1 - 0 1  Tired, decreased energy 0 1 - 1 1  Change in appetite 0 0 - 0 0  Feeling bad or failure about yourself  0 0 - 0 0  Trouble concentrating 0 0 - 0 0  Moving slowly or fidgety/restless 0 0 - 0 0  Suicidal thoughts 0 0 -  0 0  PHQ-9 Score 0 2 - 1 2  Difficult doing work/chores Not difficult at all - - Not difficult at all Not difficult at all    phq 9 is {gen pos [id number]}   Fall Risk: Fall Risk  05/30/2021 01/17/2021 04/15/2020 05/11/2019 03/20/2019  Falls in the past year? 0 1 0 0 0  Number falls in past yr: 0 1 0 0 0  Injury with Fall? 0 0 0 0 0  Follow up - - - Falls evaluation completed -      Functional Status Survey:      Assessment & Plan  *** There are no diagnoses linked to this encounter.

## 2021-07-25 ENCOUNTER — Ambulatory Visit: Payer: BC Managed Care – PPO | Admitting: Family Medicine

## 2021-08-02 ENCOUNTER — Ambulatory Visit (INDEPENDENT_AMBULATORY_CARE_PROVIDER_SITE_OTHER): Payer: BC Managed Care – PPO

## 2021-08-02 ENCOUNTER — Other Ambulatory Visit: Payer: Self-pay

## 2021-08-02 ENCOUNTER — Ambulatory Visit: Payer: BC Managed Care – PPO | Admitting: Podiatry

## 2021-08-02 ENCOUNTER — Encounter: Payer: Self-pay | Admitting: Podiatry

## 2021-08-02 DIAGNOSIS — M778 Other enthesopathies, not elsewhere classified: Secondary | ICD-10-CM | POA: Diagnosis not present

## 2021-08-02 MED ORDER — TRIAMCINOLONE ACETONIDE 40 MG/ML IJ SUSP
20.0000 mg | Freq: Once | INTRAMUSCULAR | Status: AC
  Administered 2021-08-02: 20 mg

## 2021-08-02 MED ORDER — CELECOXIB 100 MG PO CAPS
100.0000 mg | ORAL_CAPSULE | Freq: Two times a day (BID) | ORAL | 3 refills | Status: DC
  Filled 2021-08-02: qty 60, 30d supply, fill #0

## 2021-08-02 NOTE — Progress Notes (Signed)
Subjective:  Patient ID: Jill Cook, female    DOB: Jun 09, 1966,  MRN: [id number] HPI Chief Complaint  Patient presents with   Foot Pain    Dorsal midfoot/2nd toe left - aches on top all the time, swollen, has sharp, sudden pains tip of toe, occasional heel pain, tried Ibuprofen   New Patient (Initial Visit)    Est pt 2018    56 y.o. female presents with the above complaint.   ROS: She denies fever chills nausea vomiting muscle aches pains calf pain back pain chest pain shortness of breath.  Past Medical History:  Diagnosis Date   Anal fissure    Anemia    Anxiety    Arthritis    RIGHT HIP AND WRIST   Colon polyps 2017   Diverticulosis    GERD (gastroesophageal reflux disease)    OTC PRN   History of esophagitis    Hypertension    OA (osteoarthritis) of knee    RIGHT   Past Surgical History:  Procedure Laterality Date   ANAL FISSURE REPAIR  06-13-2000   BUNIONECTOMY Right 1991   CESAREAN SECTION  1994   COLONOSCOPY WITH PROPOFOL N/A 06/03/2019   Procedure: COLONOSCOPY WITH PROPOFOL;  Surgeon: Virgel Manifold, MD;  Location: [facility];  Service: Endoscopy;  Laterality: N/A;   CONVERSION TO TOTAL KNEE Right 08/22/2015   Procedure: partial knee CONVERSION TO RIGHT TOTAL KNEE;  Surgeon: Paralee Cancel, MD;  Location: [facility];  Service: Orthopedics;  Laterality: Right;   D & C HYSTEROSCOPY /  NOVASURE ENDOMETRIAL ABLATION  11-15-2005   ESOPHAGOGASTRODUODENOSCOPY (EGD) WITH PROPOFOL N/A 06/03/2019   Procedure: ESOPHAGOGASTRODUODENOSCOPY (EGD) WITH PROPOFOL;  Surgeon: Virgel Manifold, MD;  Location: [facility];  Service: Endoscopy;  Laterality: N/A;   KNEE ARTHROSCOPY W/ MENISCECTOMY Right    KNEE ARTHROSCOPY WITH MEDIAL MENISECTOMY Right 10/06/2013   Procedure: RIGHT KNEE ARTHROSCOPY WITH CHONDROPLASTY;  Surgeon: Sydnee Cabal, MD;  Location: [facility];  Service: Orthopedics;  Laterality: Right;   LAPAROSCOPY N/A 11/01/2015   Procedure:  LAPAROSCOPY DIAGNOSTIC;  Surgeon: Molli Posey, MD;  Location: [facility];  Service: Gynecology;  Laterality: N/A;   RIGHT KNEE PATELLOFEMORAL ARTHROPLASTY  06-10-2008   SEPTOPLASTY  2000   TONSILLECTOMY  AGE 16   TRANSTHORACIC ECHOCARDIOGRAM  06-03-2008   NORMAL /  EF 60%   TUBAL LIGATION  1996    Current Outpatient Medications:    celecoxib (CELEBREX) 100 MG capsule, Take 1 capsule (100 mg total) by mouth 2 (two) times daily., Disp: 60 capsule, Rfl: 3   aspirin EC 81 MG tablet, Take 81 mg by mouth daily., Disp: , Rfl:    cetirizine (ZYRTEC) 10 MG tablet, Take 10 mg by mouth daily. (Patient not taking: Reported on 05/30/2021), Disp: , Rfl:    ipratropium (ATROVENT) 0.03 % nasal spray, Place 2 sprays into both nostrils every 12 (twelve) hours. (Patient not taking: Reported on 05/30/2021), Disp: 30 mL, Rfl: 0   losartan (COZAAR) 25 MG tablet, Take 1 tablet (25 mg total) by mouth daily., Disp: 90 tablet, Rfl: 0   Multiple Vitamin (MULTIVITAMIN) tablet, Take 1 tablet by mouth daily. (Patient not taking: Reported on 05/30/2021), Disp: , Rfl:    omeprazole (PRILOSEC) 40 MG capsule, Take 1 capsule (40 mg total) by mouth daily. (Patient not taking: Reported on 05/30/2021), Disp: 90 capsule, Rfl: 0   triamcinolone (NASACORT) 55 MCG/ACT AERO nasal inhaler, Use 2 sprays in each nostril daily (Patient not taking: Reported on  05/30/2021), Disp: 32.4 mL, Rfl: 11  Allergies  Allergen Reactions   Codeine Nausea And Vomiting   Hydrocodone Nausea Only   Morphine Hives and Nausea And Vomiting   Review of Systems Objective:  There were no vitals filed for this visit.  General: Well developed, nourished, in no acute distress, alert and oriented x3   Dermatological: Skin is warm, dry and supple bilateral. Nails x 10 are well maintained; remaining integument appears unremarkable at this time. There are no open sores, no preulcerative lesions, no rash or signs of infection present.  Vascular:  Dorsalis Pedis artery and Posterior Tibial artery pedal pulses are 2/4 bilateral with immedate capillary fill time. Pedal hair growth present. No varicosities and no lower extremity edema present bilateral.   Neruologic: Grossly intact via light touch bilateral. Vibratory intact via tuning fork bilateral. Protective threshold with Semmes Wienstein monofilament intact to all pedal sites bilateral. Patellar and Achilles deep tendon reflexes 2+ bilateral. No Babinski or clonus noted bilateral.   Musculoskeletal: No gross boney pedal deformities bilateral. No pain, crepitus, or limitation noted with foot and ankle range of motion bilateral. Muscular strength 5/5 in all groups tested bilateral.  She has pain on palpation and attempted range of motion of the second and third tarsometatarsal joints left foot.  She has a floppy third toe with plantar flexion at the PIPJ and metatarsal phalangeal joint.  This is most likely secondary to trauma since there is a fracture that coincides with this toe.  More than likely there was a injury that transected the extensor tendon as well.  Gait: Unassisted, Nonantalgic.    Radiographs:  Radiographs taken today of the left foot demonstrate an osseously mature foot with significant osteoarthritis of the tarsometatarsal joint #2 #3.  There is dorsal jamming noted with some sclerosis and eburnation.  She also has calcification of the plantar fascia most likely due to tears as she has a fracture to the base of the proximal phalanx third left.  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  A flail third toe left.  Plan: Discussed etiology pathology conservative surgical therapies at this point were going to try injection of Kenalog she tolerated procedure well after local anesthetic sterile Betadine skin prep.  Starting her on Celebrex 100 mg twice daily.  I am also going to recommend  orthotics should this fail to eliminate her symptoms.  We did discuss the possible need for surgery with fusion of the TMT joints.     Hikari Tripp T. [place], [place]

## 2021-08-28 ENCOUNTER — Other Ambulatory Visit: Payer: Self-pay

## 2021-08-28 MED ORDER — FLUOCINONIDE 0.05 % EX SOLN
CUTANEOUS | 1 refills | Status: DC
  Filled 2021-08-28: qty 60, 30d supply, fill #0

## 2021-09-13 ENCOUNTER — Encounter: Payer: Self-pay | Admitting: Podiatry

## 2021-09-13 ENCOUNTER — Other Ambulatory Visit: Payer: Self-pay

## 2021-09-13 ENCOUNTER — Ambulatory Visit: Payer: BC Managed Care – PPO | Admitting: Podiatry

## 2021-09-13 DIAGNOSIS — M7989 Other specified soft tissue disorders: Secondary | ICD-10-CM

## 2021-09-13 DIAGNOSIS — M19072 Primary osteoarthritis, left ankle and foot: Secondary | ICD-10-CM | POA: Diagnosis not present

## 2021-09-13 DIAGNOSIS — M778 Other enthesopathies, not elsewhere classified: Secondary | ICD-10-CM

## 2021-09-13 NOTE — Progress Notes (Signed)
She still having moderate to severe pain on ambulation of her left foot.  States that the pain is in the toe and radiates all the way up into the foot.  Objective: Vital signs stable alert and oriented x3 no change in physical exam she still has a third toe that is flail but on radiographic evaluation does demonstrate some type of osseous tumor or traumatic injury is very hard to delineated at this point.  She is noted also to have significant osteoarthritic changes of the tarsometatarsal joints.  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  Plan: We Are Requesting MRI of the Midfoot Distal with Contrast

## 2021-09-21 ENCOUNTER — Telehealth: Payer: Self-pay | Admitting: *Deleted

## 2021-09-21 NOTE — Telephone Encounter (Signed)
"  Dr. Milinda Pointer told me to call if I hadn't heard anything about scheduling an MRI for my left foot.  Give me a call back."

## 2021-10-05 ENCOUNTER — Other Ambulatory Visit: Payer: Self-pay

## 2021-10-05 ENCOUNTER — Ambulatory Visit
Admission: RE | Admit: 2021-10-05 | Discharge: 2021-10-05 | Disposition: A | Discharge status: Home / Self Care | Payer: BC Managed Care – PPO | Source: Ambulatory Visit | Attending: Podiatry | Admitting: Podiatry

## 2021-10-05 DIAGNOSIS — M19072 Primary osteoarthritis, left ankle and foot: Secondary | ICD-10-CM

## 2021-10-05 DIAGNOSIS — M7989 Other specified soft tissue disorders: Secondary | ICD-10-CM

## 2021-10-05 DIAGNOSIS — M778 Other enthesopathies, not elsewhere classified: Secondary | ICD-10-CM

## 2021-10-05 MED ORDER — GADOBENATE DIMEGLUMINE 529 MG/ML IV SOLN
19.0000 mL | Freq: Once | INTRAVENOUS | Status: AC | PRN
  Administered 2021-10-05: 19 mL via INTRAVENOUS

## 2021-10-17 ENCOUNTER — Encounter: Payer: Self-pay | Admitting: Family Medicine

## 2021-10-30 NOTE — Progress Notes (Signed)
Name: Jill Cook   MRN: [id number]    DOB: February 07, 1966   Date:10/31/2021 ? ?     Progress Note ? ?Subjective ? ?Chief Complaint ? ?Cardiology Referral ? ?HPI ? ?Stress: she noticed heart palpitation a couple of months ago but got much worse over the past few weeks. 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 She is also help care for her parents that have medical problems.  ? ?Palpitation : feels like a pounding sensation, sometimes skips a beat. She feels nervous during the episodes. Not associated with nausea, vomiting or diaphoresis. She states one time she had difficulty breathing. She is concerned because of family history of Afib ( mother ) father had a MI.  ? ?IBS: she feels bloated, intermittent abdominal pain, mostly constipation type . She took linzess years ago and is willing to resume medication  ? ?Left foot pain: she sees podiatrist and will need surgery soon  ? ?Patient Active Problem List  ? Diagnosis Date Noted  ? History of endometriosis 04/15/2020  ? Essential hypertension 05/11/2019  ? Palpitations 05/11/2019  ? Bilateral carpal tunnel syndrome 01/08/2019  ? Polyp of colon 01/08/2019  ? Family history of colorectal cancer 01/08/2019  ? Obese 08/23/2015  ? 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 08/22/2015  ? Generalized anxiety disorder 08/17/2015  ? Depression 08/17/2015  ? Gastroesophageal reflux disease with esophagitis without hemorrhage 10/14/2008  ? Irritable bowel syndrome 04/12/2008  ? ? ?Past Surgical History:  ?Procedure Laterality Date  ? ANAL FISSURE REPAIR  06-13-2000  ? BUNIONECTOMY Right 1991  ? [place]  ? COLONOSCOPY WITH PROPOFOL N/A 06/03/2019  ? Procedure: COLONOSCOPY WITH PROPOFOL;  Surgeon:  Virgel Manifold, MD;  Location: [facility];  Service: Endoscopy;  Laterality: N/A;  ? CONVERSION TO TOTAL KNEE Right 08/22/2015  ? Procedure: partial knee CONVERSION TO RIGHT TOTAL KNEE;  Surgeon: Paralee Cancel, MD;  Location: [facility];  Service: Orthopedics;  Laterality: Right;  ? D & C HYSTEROSCOPY /  NOVASURE ENDOMETRIAL ABLATION  11-15-2005  ? ESOPHAGOGASTRODUODENOSCOPY (EGD) WITH PROPOFOL N/A 06/03/2019  ? Procedure: ESOPHAGOGASTRODUODENOSCOPY (EGD) WITH PROPOFOL;  Surgeon: Virgel Manifold, MD;  Location: [facility];  Service: Endoscopy;  Laterality: N/A;  ? KNEE ARTHROSCOPY W/ MENISCECTOMY Right   ? KNEE ARTHROSCOPY WITH MEDIAL MENISECTOMY Right 10/06/2013  ? Procedure: RIGHT KNEE ARTHROSCOPY WITH CHONDROPLASTY;  Surgeon: Sydnee Cabal, MD;  Location: [facility];  Service: Orthopedics;  Laterality: Right;  ? LAPAROSCOPY N/A 11/01/2015  ? Procedure: LAPAROSCOPY DIAGNOSTIC;  Surgeon: Molli Posey, MD;  [facility];  Service: Gynecology;  Laterality: N/A;  ? RIGHT KNEE PATELLOFEMORAL ARTHROPLASTY  06-10-2008  ? SEPTOPLASTY  2000  ? TONSILLECTOMY  AGE 56  ? TRANSTHORACIC ECHOCARDIOGRAM  06-03-2008  ? NORMAL /  EF 60%  ? TUBAL LIGATION  1996  ? ? ?Family History  ?Problem Relation Age of Onset  ? Hypertension Mother   ? Thyroid disease Mother   ? Atrial fibrillation Mother   ? Stroke Mother   ? Heart disease Father   ? Heart attack Father 35  ? Hypertension  Paternal Grandfather   ? Diabetes Paternal Grandfather   ? Uterine cancer Paternal Aunt   ? Colon cancer Maternal Aunt   ?     dx in her 78's  ? Esophageal cancer Maternal Grandfather   ? ? ?Social History  ? ?Tobacco Use  ? Smoking status: Never  ? Smokeless tobacco: Never  ?Substance Use Topics  ? Alcohol use: Yes  ?  Comment: 1 drink per month  ? ? ? ?Current Outpatient Medications:  ?  aspirin EC 81 MG tablet, Take 81 mg by mouth daily., Disp: , Rfl:  ?  celecoxib (CELEBREX) 100 MG capsule, Take 1  capsule (100 mg total) by mouth 2 (two) times daily., Disp: 60 capsule, Rfl: 3 ?  omeprazole (PRILOSEC) 40 MG capsule, Take 1 capsule (40 mg total) by mouth daily., Disp: 90 capsule, Rfl: 0 ?  fluocinonide (LIDEX) 0.05 % external solution, Apply thin layer to affected areas of scalp qd prn flares. Do not apply on clear skin (Patient not taking: Reported on 10/31/2021), Disp: 60 mL, Rfl: 1 ?  Multiple Vitamin (MULTIVITAMIN) tablet, Take 1 tablet by mouth daily. (Patient not taking: Reported on 10/31/2021), Disp: , Rfl:  ? ?Allergies  ?Allergen Reactions  ? Codeine Nausea And Vomiting  ? Hydrocodone Nausea Only  ? Morphine Hives and Nausea And Vomiting  ? ? ?I personally reviewed active problem list, medication list, allergies, family history, social history, health maintenance with the patient/caregiver today. ? ? ?ROS ? ?Constitutional: Negative for fever or weight change.  ?Respiratory: Negative for cough and shortness of breath.   ?Cardiovascular: Negative for chest pain, positive for  palpitations.  ?Gastrointestinal: Negative for abdominal pain, no bowel changes.  ?Musculoskeletal: Negative for gait problem or joint swelling.  ?Skin: Negative for rash.  ?Neurological: Negative for dizziness or headache.  ?No other specific complaints in a complete review of systems (except as listed in HPI above).  ? ?Objective ? ?Vitals:  ? 10/31/21 1307  ?BP: 136/84  ?Pulse: 97  ?Resp: 16  ?SpO2: 98%  ?Weight: 218 lb (98.9 kg)  ?Height: '5\' 6"'$  (1.676 m)  ? ? ?Body mass index is 35.19 kg/m?. ? ?Physical Exam ? ?Constitutional: Patient appears well-developed and well-nourished. Obese  No distress.  ?HEENT: head atraumatic, normocephalic, pupils equal and reactive to light, neck supple ?Cardiovascular: Normal rate, regular rhythm and normal heart sounds.  No murmur heard. No BLE edema. ?Pulmonary/Chest: Effort normal and breath sounds normal. No respiratory distress. ?Abdominal: Soft.  There is no tenderness. ?Psychiatric: Patient has  a normal mood and affect. behavior is normal. Judgment and thought content normal.  ? ?PHQ2/9: ? ?  10/31/2021  ?  1:06 PM 05/30/2021  ?  8:33 AM 01/17/2021  ?  2:11 PM 04/15/2020  ?  3:59 PM 05/11/2019  ?  8:18 AM  ?Depression screen PHQ 2/9  ?Decreased Interest 0 0 0 0 0  ?Down, Depressed, Hopeless 1 0 0 0 0  ?PHQ - 2 Score 1 0 0 0 0  ?Altered sleeping 2 0 1  0  ?Tired, decreased energy 3 0 1  1  ?Change in appetite 0 0 0  0  ?Feeling bad or failure about yourself  0 0 0  0  ?Trouble concentrating 0 0 0  0  ?Moving slowly or fidgety/restless 0 0 0  0  ?Suicidal thoughts 0 0 0  0  ?PHQ-9 Score 6 0 2  1  ?Difficult doing work/chores  Not difficult at all  Not difficult at all  ?  ?phq 9 is positive ? ? ?  10/31/2021  ?  1:11 PM  ?GAD 7 : Generalized Anxiety Score  ?Nervous, Anxious, on Edge 2  ?Control/stop worrying 2  ?Worry too much - different things 2  ?Trouble relaxing 3  ?Restless 0  ?Easily annoyed or irritable 0  ?Afraid - awful might happen 1  ?Total GAD 7 Score 10  ? ?  ?Fall Risk: ? ?  10/31/2021  ?  1:06 PM 05/30/2021  ?  8:33 AM 01/17/2021  ?  2:11 PM 04/15/2020  ?  3:58 PM 05/11/2019  ?  8:18 AM  ?Fall Risk   ?Falls in the past year? 0 0 1 0 0  ?Number falls in past yr: 0 0 1 0 0  ?Injury with Fall? 0 0 0 0 0  ?Risk for fall due to : No Fall Risks      ?Follow up Falls prevention discussed    Falls evaluation completed  ? ? ? ? ?Functional Status Survey: ?Is the patient deaf or have difficulty hearing?: No ?Does the patient have difficulty seeing, even when wearing glasses/contacts?: Yes ?Does the patient have difficulty concentrating, remembering, or making decisions?: Yes ?Does the patient have difficulty walking or climbing stairs?: No ?Does the patient have difficulty dressing or bathing?: No ?Does the patient have difficulty doing errands alone such as visiting a doctor's office or shopping?: No ? ? ? ?Assessment & Plan ? ?1. Palpitations ? ?- Ambulatory referral to Cardiology ?- TSH ?- COMPLETE METABOLIC  PANEL WITH GFR ?- CBC with Differential/Platelet ? ?2. Irritable bowel syndrome with constipation ? ?- ubiprostone (AMITIZA) 24 MCG capsule; Take 1 capsule (24 mcg total) by mouth 2 (two) times daily with a meal.

## 2021-10-31 ENCOUNTER — Encounter: Payer: Self-pay | Admitting: Family Medicine

## 2021-10-31 ENCOUNTER — Ambulatory Visit: Payer: BC Managed Care – PPO | Admitting: Family Medicine

## 2021-10-31 ENCOUNTER — Other Ambulatory Visit: Payer: Self-pay

## 2021-10-31 VITALS — BP 136/84 | HR 97 | Resp 16 | Ht 66.0 in | Wt 218.0 lb

## 2021-10-31 DIAGNOSIS — K581 Irritable bowel syndrome with constipation: Secondary | ICD-10-CM

## 2021-10-31 DIAGNOSIS — F341 Dysthymic disorder: Secondary | ICD-10-CM | POA: Diagnosis not present

## 2021-10-31 DIAGNOSIS — Z8249 Family history of ischemic heart disease and other diseases of the circulatory system: Secondary | ICD-10-CM

## 2021-10-31 DIAGNOSIS — Z131 Encounter for screening for diabetes mellitus: Secondary | ICD-10-CM

## 2021-10-31 DIAGNOSIS — R002 Palpitations: Secondary | ICD-10-CM | POA: Diagnosis not present

## 2021-10-31 DIAGNOSIS — E785 Hyperlipidemia, unspecified: Secondary | ICD-10-CM

## 2021-10-31 DIAGNOSIS — F410 Panic disorder [episodic paroxysmal anxiety] without agoraphobia: Secondary | ICD-10-CM | POA: Diagnosis not present

## 2021-10-31 DIAGNOSIS — M79672 Pain in left foot: Secondary | ICD-10-CM

## 2021-10-31 DIAGNOSIS — I10 Essential (primary) hypertension: Secondary | ICD-10-CM

## 2021-10-31 MED ORDER — DULOXETINE HCL 30 MG PO CPEP
30.0000 mg | ORAL_CAPSULE | Freq: Every day | ORAL | 1 refills | Status: DC
  Filled 2021-10-31: qty 60, 30d supply, fill #0
  Filled 2022-01-02: qty 60, 30d supply, fill #1

## 2021-10-31 MED ORDER — ALPRAZOLAM 0.5 MG PO TABS
0.5000 mg | ORAL_TABLET | Freq: Every day | ORAL | 0 refills | Status: DC | PRN
  Filled 2021-10-31: qty 5, 5d supply, fill #0

## 2021-10-31 MED ORDER — LUBIPROSTONE 24 MCG PO CAPS
24.0000 ug | ORAL_CAPSULE | Freq: Two times a day (BID) | ORAL | 1 refills | Status: DC
  Filled 2021-10-31: qty 180, 90d supply, fill #0

## 2021-11-01 ENCOUNTER — Other Ambulatory Visit: Payer: Self-pay

## 2021-11-01 LAB — CBC WITH DIFFERENTIAL/PLATELET
Absolute Monocytes: 358 cells/uL (ref 200–950)
Basophils Absolute: 62 cells/uL (ref 0–200)
Basophils Relative: 1.1 %
Eosinophils Absolute: 129 cells/uL (ref 15–500)
Eosinophils Relative: 2.3 %
HCT: 38 % (ref 35.0–45.0)
Hemoglobin: 12.9 g/dL (ref 11.7–15.5)
Lymphs Abs: 1798 cells/uL (ref 850–3900)
MCH: 29.3 pg (ref 27.0–33.0)
MCHC: 33.9 g/dL (ref 32.0–36.0)
MCV: 86.4 fL (ref 80.0–100.0)
MPV: 11 fL (ref 7.5–12.5)
Monocytes Relative: 6.4 %
Neutro Abs: 3254 cells/uL (ref 1500–7800)
Neutrophils Relative %: 58.1 %
Platelets: 180 10*3/uL (ref 140–400)
RBC: 4.4 10*6/uL (ref 3.80–5.10)
RDW: 13.7 % (ref 11.0–15.0)
Total Lymphocyte: 32.1 %
WBC: 5.6 10*3/uL (ref 3.8–10.8)

## 2021-11-01 LAB — COMPLETE METABOLIC PANEL WITH GFR
AG Ratio: 1.9 (calc) (ref 1.0–2.5)
ALT: 13 U/L (ref 6–29)
AST: 13 U/L (ref 10–35)
Albumin: 4.4 g/dL (ref 3.6–5.1)
Alkaline phosphatase (APISO): 59 U/L (ref 37–153)
BUN: 20 mg/dL (ref 7–25)
CO2: 31 mmol/L (ref 20–32)
Calcium: 9.8 mg/dL (ref 8.6–10.4)
Chloride: 102 mmol/L (ref 98–110)
Creat: 0.86 mg/dL (ref 0.50–1.03)
Globulin: 2.3 g/dL (calc) (ref 1.9–3.7)
Glucose, Bld: 83 mg/dL (ref 65–99)
Potassium: 4.3 mmol/L (ref 3.5–5.3)
Sodium: 141 mmol/L (ref 135–146)
Total Bilirubin: 0.6 mg/dL (ref 0.2–1.2)
Total Protein: 6.7 g/dL (ref 6.1–8.1)
eGFR: 80 mL/min/{1.73_m2} (ref >=60)

## 2021-11-01 LAB — LIPID PANEL
Cholesterol: 168 mg/dL (ref <200)
HDL: 44 mg/dL — ABNORMAL LOW (ref >=50)
LDL Cholesterol (Calc): 103 mg/dL (calc) — ABNORMAL HIGH
Non-HDL Cholesterol (Calc): 124 mg/dL (calc) (ref <130)
Total CHOL/HDL Ratio: 3.8 (calc) (ref <5.0)
Triglycerides: 118 mg/dL (ref <150)

## 2021-11-01 LAB — HEMOGLOBIN A1C
Hgb A1c MFr Bld: 5.4 % of total Hgb (ref <5.7)
Mean Plasma Glucose: 108 mg/dL
eAG (mmol/L): 6 mmol/L

## 2021-11-01 LAB — TSH: TSH: 1.28 mIU/L

## 2021-11-08 ENCOUNTER — Ambulatory Visit: Payer: BC Managed Care – PPO | Admitting: Podiatry

## 2021-11-08 DIAGNOSIS — M85672 Other cyst of bone, left ankle and foot: Secondary | ICD-10-CM

## 2021-11-08 DIAGNOSIS — R2689 Other abnormalities of gait and mobility: Secondary | ICD-10-CM | POA: Diagnosis not present

## 2021-11-08 DIAGNOSIS — M19072 Primary osteoarthritis, left ankle and foot: Secondary | ICD-10-CM | POA: Diagnosis not present

## 2021-11-08 DIAGNOSIS — M85679 Other cyst of bone, unspecified ankle and foot: Secondary | ICD-10-CM

## 2021-11-09 ENCOUNTER — Encounter: Payer: Self-pay | Admitting: Podiatry

## 2021-11-09 ENCOUNTER — Telehealth: Payer: Self-pay

## 2021-11-09 NOTE — Telephone Encounter (Signed)
Received surgery paperwork from the [place] office. Let a message for Donalda to call and scheduled surgery with Dr. Sherryle Lis ?

## 2021-11-11 ENCOUNTER — Encounter: Payer: Self-pay | Admitting: Podiatry

## 2021-11-11 LAB — VITAMIN D 25 HYDROXY (VIT D DEFICIENCY, FRACTURES): Vit D, 25-Hydroxy: 26.1 ng/mL — ABNORMAL LOW (ref 30.0–100.0)

## 2021-11-11 LAB — CALCIUM: Calcium: 9.1 mg/dL (ref 8.7–10.2)

## 2021-11-11 MED ORDER — VITAMIN D (ERGOCALCIFEROL) 1.25 MG (50000 UNIT) PO CAPS
50000.0000 [IU] | ORAL_CAPSULE | ORAL | 0 refills | Status: DC
  Filled 2021-11-11: qty 8, 56d supply, fill #0

## 2021-11-11 NOTE — Progress Notes (Signed)
?Subjective:  ?Patient ID: Jill Cook, female    DOB: [birth year],  MRN: [id number] ? ?Chief Complaint  ?Patient presents with  ? Foot Pain  ?  Surgical consult left foot  ? ? ?56 y.o. female presents with the above complaint. History confirmed with patient.  She presents today for consultation for surgery for her left foot.  She completed the MRI.  She was referred to me Dr. Milinda Pointer.  She primarily has an issue of pain in the left mid arch has been going on for some time. ? ?Objective:  ?Physical Exam: ?warm, good capillary refill, no trophic changes or ulcerative lesions, normal DP and PT pulses, and normal sensory exam. ?Left Foot: Palpable spurring and diffuse pain over the dorsal and plantar midfoot, she does have mild hallux valgus deformity with good range of motion of the toe no pain on the medial eminence or in the first MTPJ.  The third toe is contracted plantar and has minimal stability ? ? ?Radiographs: ?I reviewed her previous left foot radiographs there is severe osteoarthrosis of the first and second tarsometatarsal joints, there is mild hallux valgus deformity with hallux interphalangeus ? ?Her MRI shows severe osteoarthrosis of the second and third tarsometatarsal joints with a large subchondral cyst in the second TMT on the metatarsal side.  There is a chronic posttraumatic deformity of the third toe noted as well without suspicious bone lesions ? ?Assessment:  ? ?1. Bone cyst of foot   ?2. Inability to bear weight   ?3. Osteoarthritis of left ankle and foot   ? ? ? ?Plan:  ?Patient was evaluated and treated and all questions answered. ? ?Today we reviewed the results of her MRI and previous radiographs in detail together.  We discussed surgical and nonsurgical treatment.  I do think at her age and physical activity level she would benefit best from surgical treatment.  We discussed arthrodesis of the second and third tarsometatarsal joints.  She does have hallux valgus but it is not  painful and limiting for her right now.  She also has deformity of the third toe likely from prior trauma, it is possible she has had traumatic injury to the flexor and extensor tendons as it is flail at the IPJ.  We discussed the risk benefits and potential complications of surgery.  This includes but not limited to pain, swelling, infection, scar, numbness which may be temporary or permanent, chronic pain, stiffness, nerve pain or damage, wound healing problems, bone healing problems including delayed or non-union.  Surgical we discussed arthrodesis of the second and third tarsometatarsal joints with plate and screw fixation and possible stable fixation, she will require bone autograft to fill the cyst and arthrodesis site from the calcaneus due to the significant defect here, we also discussed treatment of her third toe and I recommended PIPJ arthrodesis of this with Kirschner wire fixation.  Informed consent was signed reviewed.  All questions were addressed today.  No guarantees as to the outcome of the surgery referral to be made.  Surgery will be scheduled at a mutually agreeable date.  I did check her vitamin D and calcium level, it was insufficient and we will supplement this. ? ? ?Surgical plan: ? ?Procedure: ?-Left foot second and third tarsometatarsal joint fusion, autograft from calcaneus, hammertoe correction third toe ? ?Location: ?-[facility] ? ?Anesthesia plan: ?-IV sedation with regional block ? ?Postoperative pain plan: ?- Tylenol 1000 mg every 6 hours, gabapentin 300 mg every 8 hours x5 days, oxycodone 5  mg 1-2 tabs every 6 hours only as needed ? ?DVT prophylaxis: ?-Xarelto 10 mg nightly ? ?WB Restrictions / DME needs: ?-NWB for 6 weeks in CAM boot, this was dispensed today she may wear this now for support and pain relief ? ? ? ? ? ?No follow-ups on file.  ? ?

## 2021-11-13 ENCOUNTER — Other Ambulatory Visit: Payer: Self-pay

## 2021-11-22 ENCOUNTER — Ambulatory Visit: Payer: BC Managed Care – PPO | Admitting: Family Medicine

## 2021-11-27 ENCOUNTER — Encounter: Payer: Self-pay | Admitting: Podiatry

## 2021-11-29 ENCOUNTER — Encounter: Payer: Self-pay | Admitting: Podiatry

## 2021-11-30 NOTE — Telephone Encounter (Signed)
Scheduled pt an appt on the 17 th of May for her xrays. ?

## 2021-12-06 ENCOUNTER — Telehealth: Payer: Self-pay | Admitting: Urology

## 2021-12-06 NOTE — Progress Notes (Deleted)
Cardiology Office Note  Date:  12/06/2021   ID:  Jill Cook, Jill Cook 1965-10-19, MRN [id number]  PCP:  Steele Sizer, MD   No chief complaint on file.   HPI:  [name] is a 56 year old woman with past medical history of Hypertension Anxiety Family history of heart disease Referred by Dr. Ancil Boozer for palpitations  On visit with primary care October 31, 2021 reported having palpitation : feels like a pounding sensation, sometimes skips a beat. She feels nervous during the episodes. Not associated with nausea, vomiting or diaphoresis. She states one time she had difficulty breathing. She is concerned because of family history of Afib ( mother ) father had a MI    PMH:   has a past medical history of Anal fissure, Anemia, Anxiety, Arthritis, Colon polyps (2017), Diverticulosis, GERD (gastroesophageal reflux disease), History of esophagitis, Hypertension, and OA (osteoarthritis) of knee.  PSH:    Past Surgical History:  Procedure Laterality Date   ANAL FISSURE REPAIR  06-13-2000   BUNIONECTOMY Right 1991   CESAREAN SECTION  1994   COLONOSCOPY WITH PROPOFOL N/A 06/03/2019   Procedure: COLONOSCOPY WITH PROPOFOL;  Surgeon: Virgel Manifold, MD;  Location: [facility];  Service: Endoscopy;  Laterality: N/A;   CONVERSION TO TOTAL KNEE Right 08/22/2015   Procedure: partial knee CONVERSION TO RIGHT TOTAL KNEE;  Surgeon: Paralee Cancel, MD;  Location: [facility];  Service: Orthopedics;  Laterality: Right;   D & C HYSTEROSCOPY /  NOVASURE ENDOMETRIAL ABLATION  11-15-2005   ESOPHAGOGASTRODUODENOSCOPY (EGD) WITH PROPOFOL N/A 06/03/2019   Procedure: ESOPHAGOGASTRODUODENOSCOPY (EGD) WITH PROPOFOL;  Surgeon: Virgel Manifold, MD;  Location: [facility];  Service: Endoscopy;  Laterality: N/A;   KNEE ARTHROSCOPY W/ MENISCECTOMY Right    KNEE ARTHROSCOPY WITH MEDIAL MENISECTOMY Right 10/06/2013   Procedure: RIGHT KNEE ARTHROSCOPY WITH CHONDROPLASTY;  Surgeon: Sydnee Cabal, MD;   Location: [facility];  Service: Orthopedics;  Laterality: Right;   LAPAROSCOPY N/A 11/01/2015   Procedure: LAPAROSCOPY DIAGNOSTIC;  Surgeon: Molli Posey, MD;  Location: [facility];  Service: Gynecology;  Laterality: N/A;   RIGHT KNEE PATELLOFEMORAL ARTHROPLASTY  06-10-2008   SEPTOPLASTY  2000   TONSILLECTOMY  AGE 68   TRANSTHORACIC ECHOCARDIOGRAM  06-03-2008   NORMAL /  EF 60%   TUBAL LIGATION  1996    Current Outpatient Medications  Medication Sig Dispense Refill   ALPRAZolam (XANAX) 0.5 MG tablet Take 1 tablet (0.5 mg total) by mouth daily as needed for anxiety. 5 tablet 0   aspirin EC 81 MG tablet Take 81 mg by mouth daily.     celecoxib (CELEBREX) 100 MG capsule Take 1 capsule (100 mg total) by mouth 2 (two) times daily. 60 capsule 3   DULoxetine (CYMBALTA) 30 MG capsule Take 1-2 capsules (30-60 mg total) by mouth daily. 60 capsule 1   lubiprostone (AMITIZA) 24 MCG capsule Take 1 capsule (24 mcg total) by mouth 2 (two) times daily with a meal. 180 capsule 1   Multiple Vitamin (MULTIVITAMIN) tablet Take 1 tablet by mouth daily. (Patient not taking: Reported on 10/31/2021)     omeprazole (PRILOSEC) 40 MG capsule Take 1 capsule (40 mg total) by mouth daily. 90 capsule 0   Vitamin D, Ergocalciferol, (DRISDOL) 1.25 MG (50000 UNIT) CAPS capsule Take 1 capsule (50,000 Units total) by mouth every 7 (seven) days. 8 capsule 0   No current facility-administered medications for this visit.     Allergies:   Codeine, Hydrocodone, and Morphine  Social History:  The patient  reports that she has never smoked. She has never used smokeless tobacco. She reports current alcohol use. She reports that she does not use drugs.   Family History:   family history includes Atrial fibrillation in her mother; Colon cancer in her maternal aunt; Diabetes in her paternal grandfather; Esophageal cancer in her maternal grandfather; Heart attack (age of onset: 61) in her father; Heart  disease in her father; Hypertension in her mother and paternal grandfather; Stroke in her mother; Thyroid disease in her mother; Uterine cancer in her paternal aunt.    Review of Systems: ROS   PHYSICAL EXAM: VS:  There were no vitals taken for this visit. , BMI There is no height or weight on file to calculate BMI. GEN: Well nourished, well developed, in no acute distress HEENT: normal Neck: no JVD, carotid bruits, or masses Cardiac: RRR; no murmurs, rubs, or gallops,no edema  Respiratory:  clear to auscultation bilaterally, normal work of breathing GI: soft, nontender, nondistended, + BS MS: no deformity or atrophy Skin: warm and dry, no rash Neuro:  Strength and sensation are intact Psych: euthymic mood, full affect    Recent Labs: 10/31/2021: ALT 13; BUN 20; Creat 0.86; Hemoglobin 12.9; Platelets 180; Potassium 4.3; Sodium 141; TSH 1.28    Lipid Panel Lab Results  Component Value Date   CHOL 168 10/31/2021   HDL 44 (L) 10/31/2021   LDLCALC 103 (H) 10/31/2021   TRIG 118 10/31/2021      Wt Readings from Last 3 Encounters:  10/31/21 218 lb (98.9 kg)  05/31/21 205 lb (93 kg)  05/30/21 208 lb 11.2 oz (94.7 kg)       ASSESSMENT AND PLAN:  Problem List Items Addressed This Visit   None    Disposition:   F/U  12 months   Total encounter time more than 30 minutes  Greater than 50% was spent in counseling and coordination of care with the patient    Signed, Esmond Plants, M.D., Ph.D. [place], [place]

## 2021-12-06 NOTE — Telephone Encounter (Signed)
DOS - 01/05/22 ? ?ARTHRODESIS LEFT --- 28730 ?HAMMERTOE REPAIR 3RD LEFT --- 25366 ?EXCISION BONE CYST LEFT --- 28104 ?BONE GRAFT LEFT --- 20900 ? ?[employer] EFFECTIVE DATE - 07/30/21 ? ?PLAN DEDUCTIBLE - $1,250.00 W/ $0.00 REMAINING ?OUT OF POCKET - $4,890.00 W/ $3,236.69 REMAINING ?COINSURANCE - 20% ?COPAY - $0.00 ? ? ?NO PRIOR AUTH REQUIRED ?

## 2021-12-11 ENCOUNTER — Ambulatory Visit: Payer: BC Managed Care – PPO | Admitting: Cardiovascular Disease

## 2021-12-11 DIAGNOSIS — F411 Generalized anxiety disorder: Secondary | ICD-10-CM

## 2021-12-11 DIAGNOSIS — R002 Palpitations: Secondary | ICD-10-CM

## 2021-12-11 DIAGNOSIS — I10 Essential (primary) hypertension: Secondary | ICD-10-CM

## 2021-12-11 NOTE — Progress Notes (Deleted)
Name: Jill Cook   MRN: [id number]    DOB: [birth year]   Date:12/11/2021       Progress Note  Subjective  Chief Complaint  Follow up   HPI  Stress: she noticed heart palpitation a couple of months ago but got much worse over the past few weeks. 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 She is also help care for her parents that have medical problems.   Palpitation : feels like a pounding sensation, sometimes skips a beat. She feels nervous during the episodes. Not associated with nausea, vomiting or diaphoresis. She states one time she had difficulty breathing. She is concerned because of family history of Afib ( mother ) father had a MI.   IBS: she feels bloated, intermittent abdominal pain, mostly constipation type . 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 08/22/2015   Generalized anxiety disorder 08/17/2015   Depression 08/17/2015   Gastroesophageal reflux disease with esophagitis without hemorrhage 10/14/2008   Irritable bowel syndrome 04/12/2008    Past Surgical History:  Procedure Laterality Date   ANAL FISSURE REPAIR  06-13-2000   BUNIONECTOMY Right [place]   COLONOSCOPY WITH PROPOFOL N/A 06/03/2019   Procedure: COLONOSCOPY WITH PROPOFOL;  Surgeon: Virgel Manifold, MD;  Location: [facility];  Service: Endoscopy;  Laterality: N/A;   CONVERSION TO TOTAL KNEE Right 08/22/2015   Procedure: partial knee CONVERSION TO RIGHT TOTAL KNEE;  Surgeon: Paralee Cancel, MD;  Location: [facility];  Service: Orthopedics;  Laterality: Right;   D & C HYSTEROSCOPY /  NOVASURE ENDOMETRIAL ABLATION  11-15-2005   ESOPHAGOGASTRODUODENOSCOPY (EGD) WITH PROPOFOL N/A 06/03/2019   Procedure: ESOPHAGOGASTRODUODENOSCOPY (EGD) WITH PROPOFOL;  Surgeon: Virgel Manifold, MD;  Location: [facility];  Service: Endoscopy;  Laterality: N/A;   KNEE ARTHROSCOPY W/ MENISCECTOMY Right    KNEE ARTHROSCOPY WITH MEDIAL MENISECTOMY Right 10/06/2013   Procedure: RIGHT KNEE ARTHROSCOPY WITH CHONDROPLASTY;  Surgeon: Sydnee Cabal, MD;  Location: [facility];  Service: Orthopedics;  Laterality: Right;   LAPAROSCOPY N/A 11/01/2015   Procedure: LAPAROSCOPY DIAGNOSTIC;  Surgeon: Molli Posey, MD;  Location: [facility];  Service: Gynecology;  Laterality: N/A;   RIGHT KNEE PATELLOFEMORAL ARTHROPLASTY  06-10-2008   SEPTOPLASTY  2000   TONSILLECTOMY  AGE 59   TRANSTHORACIC ECHOCARDIOGRAM  06-03-2008   NORMAL /  EF 60%   TUBAL LIGATION  1996    Family History  Problem Relation Age of Onset   Hypertension Mother    Thyroid disease Mother    Atrial fibrillation Mother    Stroke Mother    Heart disease Father    Heart attack Father 30   Hypertension  Paternal Grandfather    Diabetes Paternal Grandfather    Uterine cancer Paternal Aunt    Colon cancer Maternal Aunt        dx in her 80's   Esophageal cancer Maternal Grandfather     Social History   Tobacco Use   Smoking status: Never   Smokeless tobacco: Never  Substance Use Topics   Alcohol use: Yes    Comment: 1 drink per month     Current Outpatient Medications:    ALPRAZolam (XANAX) 0.5 MG tablet, Take 1 tablet (0.5 mg total) by mouth daily as needed for anxiety., Disp: 5 tablet, Rfl: 0    aspirin EC 81 MG tablet, Take 81 mg by mouth daily., Disp: , Rfl:    celecoxib (CELEBREX) 100 MG capsule, Take 1 capsule (100 mg total) by mouth 2 (two) times daily., Disp: 60 capsule, Rfl: 3   DULoxetine (CYMBALTA) 30 MG capsule, Take 1-2 capsules (30-60 mg total) by mouth daily., Disp: 60 capsule, Rfl: 1   FLOWFLEX COVID-19 AG HOME TEST KIT, FOLLOW THE test instructions FOR using THE at-home covid test., Disp: , Rfl:    lubiprostone (AMITIZA) 24 MCG capsule, Take 1 capsule (24 mcg total) by mouth 2 (two) times daily with a meal., Disp: 180 capsule, Rfl: 1   Multiple Vitamin (MULTIVITAMIN) tablet, Take 1 tablet by mouth daily. (Patient not taking: Reported on 10/31/2021), Disp: , Rfl:    omeprazole (PRILOSEC) 40 MG capsule, Take 1 capsule (40 mg total) by mouth daily., Disp: 90 capsule, Rfl: 0   Vitamin D, Ergocalciferol, (DRISDOL) 1.25 MG (50000 UNIT) CAPS capsule, Take 1 capsule (50,000 Units total) by mouth every 7 (seven) days., Disp: 8 capsule, Rfl: 0  Allergies  Allergen Reactions   Codeine Nausea And Vomiting and Hives   Morphine Hives and Nausea And Vomiting   Hydrocodone Nausea Only   Hydromorphone Hcl Nausea Only    I personally reviewed {Reviewed:14835} with the patient/caregiver today.   ROS  ***  Objective  There were no vitals filed for this visit.  There is no height or weight on file to calculate BMI.  Physical Exam ***  Recent Results (from the past 2160 hour(s))  TSH     Status: None   Collection Time: 10/31/21  1:58 PM  Result Value Ref Range   TSH 1.28 mIU/L    Comment:           Reference Range .           > or = 20 Years  0.40-4.50 .                Pregnancy Ranges           First trimester    0.26-2.66           Second trimester   0.55-2.73           Third trimester    0.43-2.91   Hemoglobin A1c     Status: None   Collection Time: 10/31/21  1:58 PM  Result Value Ref Range   Hgb A1c MFr Bld 5.4 <5.7 % of total Hgb    Comment: For the purpose of  screening for the presence of diabetes: . <5.7%       Consistent with the absence of diabetes 5.7-6.4%    Consistent with increased risk for diabetes             (prediabetes) > or =6.5%  Consistent with diabetes . This assay result is  consistent with a decreased risk of diabetes. . Currently, no consensus exists regarding use of hemoglobin A1c for diagnosis of diabetes in children. . According to American Diabetes Association (ADA) guidelines, hemoglobin A1c <7.0% represents optimal control in non-pregnant diabetic patients. Different metrics may apply to specific patient populations.  Standards of Medical Care in Diabetes(ADA). .    Mean Plasma Glucose 108 mg/dL   eAG (mmol/L) 6.0 mmol/L  COMPLETE METABOLIC PANEL WITH GFR     Status: None   Collection Time: 10/31/21  1:58 PM  Result Value Ref Range   Glucose, Bld 83 65 - 99 mg/dL    Comment: .            Fasting reference interval .    BUN 20 7 - 25 mg/dL   Creat 0.86 0.50 - 1.03 mg/dL   eGFR 80 > OR = 60 mL/min/1.15m2    Comment: The eGFR is based on the CKD-EPI 2021 equation. To calculate  the new eGFR from a previous Creatinine or Cystatin C result, go to https://www.kidney.org/professionals/ kdoqi/gfr%5Fcalculator    BUN/Creatinine Ratio NOT APPLICABLE 6 - 22 (calc)   Sodium 141 135 - 146 mmol/L   Potassium 4.3 3.5 - 5.3 mmol/L   Chloride 102 98 - 110 mmol/L   CO2 31 20 - 32 mmol/L   Calcium 9.8 8.6 - 10.4 mg/dL   Total Protein 6.7 6.1 - 8.1 g/dL   Albumin 4.4 3.6 - 5.1 g/dL   Globulin 2.3 1.9 - 3.7 g/dL (calc)   AG Ratio 1.9 1.0 - 2.5 (calc)   Total Bilirubin 0.6 0.2 - 1.2 mg/dL   Alkaline phosphatase (APISO) 59 37 - 153 U/L   AST 13 10 - 35 U/L   ALT 13 6 - 29 U/L  CBC with Differential/Platelet     Status: None   Collection Time: 10/31/21  1:58 PM  Result Value Ref Range   WBC 5.6 3.8 - 10.8 Thousand/uL   RBC 4.40 3.80 - 5.10 Million/uL   Hemoglobin 12.9 11.7 - 15.5 g/dL   HCT 38.0 35.0 - 45.0 %    MCV 86.4 80.0 - 100.0 fL   MCH 29.3 27.0 - 33.0 pg   MCHC 33.9 32.0 - 36.0 g/dL   RDW 13.7 11.0 - 15.0 %   Platelets 180 140 - 400 Thousand/uL   MPV 11.0 7.5 - 12.5 fL   Neutro Abs 3,254 1,500 - 7,800 cells/uL   Lymphs Abs 1,798 850 - 3,900 cells/uL   Absolute Monocytes 358 200 - 950 cells/uL   Eosinophils Absolute 129 15 - 500 cells/uL   Basophils Absolute 62 0 - 200 cells/uL   Neutrophils Relative % 58.1 %   Total Lymphocyte 32.1 %   Monocytes Relative 6.4 %   Eosinophils Relative 2.3 %   Basophils Relative 1.1 %  Lipid panel     Status: Abnormal   Collection Time: 10/31/21  1:58 PM  Result Value Ref Range   Cholesterol 168 <200 mg/dL   HDL 44 (L) > OR = 50 mg/dL   Triglycerides 118 <150 mg/dL   LDL Cholesterol (Calc) 103 (H) mg/dL (calc)    Comment: Reference range: <100 . Desirable range <100 mg/dL for primary prevention;   <70 mg/dL for patients with CHD or diabetic patients  with > or = 2 CHD risk factors. Marland Kitchen LDL-C is now calculated using the Martin-Hopkins  calculation, which is a validated novel method providing  better accuracy than the Friedewald equation in the  estimation of  LDL-C.  Cresenciano Genre et al. Annamaria Helling. 5456;256(38): 2061-2068  (http://education.[employer].com/faq/FAQ164)    Total CHOL/HDL Ratio 3.8 <5.0 (calc)   Non-HDL Cholesterol (Calc) 124 <130 mg/dL (calc)    Comment: For patients with diabetes plus 1 major ASCVD risk  factor, treating to a non-HDL-C goal of <100 mg/dL  (LDL-C of <70 mg/dL) is considered a therapeutic  option.   Vitamin D, 25-hydroxy     Status: Abnormal   Collection Time: 11/10/21 10:07 AM  Result Value Ref Range   Vit D, 25-Hydroxy 26.1 (L) 30.0 - 100.0 ng/mL    Comment: Vitamin D deficiency has been defined by the [place] practice guideline as a level of serum 25-OH vitamin D less than 20 ng/mL (1,2). The Endocrine Society went on to further define vitamin D insufficiency as a level  between 21 and 29 ng/mL (2). 1. IOM (Institute of Medicine). 2010. Dietary reference    intakes for calcium and D. [place]: The    [employer]. 2. Holick MF, [place], Bischoff-Ferrari HA, et al.    Evaluation, treatment, and prevention of vitamin D    deficiency: an Endocrine Society clinical practice    guideline. JCEM. 2011 Jul; 96(7):1911-30.   Calcium     Status: None   Collection Time: 11/10/21 10:07 AM  Result Value Ref Range   Calcium 9.1 8.7 - 10.2 mg/dL    Diabetic Foot Exam: Diabetic Foot Exam - Simple   No data filed    ***  PHQ2/9:    10/31/2021    1:06 PM 05/30/2021    8:33 AM 01/17/2021    2:11 PM 04/15/2020    3:59 PM 05/11/2019    8:18 AM  Depression screen PHQ 2/9  Decreased Interest 0 0 0 0 0  Down, Depressed, Hopeless 1 0 0 0 0  PHQ - 2 Score 1 0 0 0 0  Altered sleeping 2 0 1  0  Tired, decreased energy 3 0 1  1  Change in appetite 0 0 0  0  Feeling bad or failure about yourself  0 0 0  0  Trouble concentrating 0 0 0  0  Moving slowly or fidgety/restless 0 0 0  0  Suicidal thoughts 0 0 0  0  PHQ-9 Score 6 0 2  1  Difficult doing work/chores  Not difficult at all   Not difficult at all    phq 9 is {gen pos LHT:[id number]} ***  Fall Risk:    10/31/2021    1:06 PM 05/30/2021    8:33 AM 01/17/2021    2:11 PM 04/15/2020    3:58 PM 05/11/2019    8:18 AM  Fall Risk   Falls in the past year? 0 0 1 0 0  Number falls in past yr: 0 0 1 0 0  Injury with Fall? 0 0 0 0 0  Risk for fall due to : No Fall Risks      Follow up Falls prevention discussed    Falls evaluation completed   ***   Functional Status Survey:   ***   Assessment & Plan  *** There are no diagnoses linked to this encounter.

## 2021-12-12 ENCOUNTER — Ambulatory Visit: Payer: BC Managed Care – PPO | Admitting: Family Medicine

## 2021-12-13 ENCOUNTER — Ambulatory Visit (INDEPENDENT_AMBULATORY_CARE_PROVIDER_SITE_OTHER): Payer: BC Managed Care – PPO

## 2021-12-13 ENCOUNTER — Ambulatory Visit: Payer: BC Managed Care – PPO | Admitting: Podiatry

## 2021-12-13 DIAGNOSIS — M85679 Other cyst of bone, unspecified ankle and foot: Secondary | ICD-10-CM

## 2021-12-13 DIAGNOSIS — S90122A Contusion of left lesser toe(s) without damage to nail, initial encounter: Secondary | ICD-10-CM

## 2021-12-13 DIAGNOSIS — M85672 Other cyst of bone, left ankle and foot: Secondary | ICD-10-CM

## 2021-12-18 NOTE — Progress Notes (Signed)
  Subjective:  Patient ID: Jill Cook, female    DOB: 1966-01-23,  MRN: [id number]  Chief Complaint  Patient presents with   Foot Pain    Bone cyst left foot, new xrays    56 y.o. female presents with the above complaint. History confirmed with patient.  She returns for follow-up from an injury she stubbed her toe on the left foot  Objective:  Physical Exam: warm, good capillary refill, no trophic changes or ulcerative lesions, normal DP and PT pulses, normal sensory exam, and no ecchymosis some tenderness in the third toe.   Radiographs: Multiple views x-ray of the left foot: No evidence of fracture third toe Assessment:   1. Contusion of lesser toe of left foot without damage to nail, initial encounter      Plan:  Patient was evaluated and treated and all questions answered.  We reviewed her x-rays discussed that she has a contusion expected to resolve.  Should not change her surgical plan at this point.  Does not necessarily need to buddy tape but ice can be helpful.  I will see her at surgery As scheduled  No follow-ups on file.

## 2022-01-02 ENCOUNTER — Other Ambulatory Visit: Payer: Self-pay

## 2022-01-05 ENCOUNTER — Other Ambulatory Visit: Payer: Self-pay | Admitting: Podiatry

## 2022-01-05 DIAGNOSIS — M89772 Major osseous defect, left ankle and foot: Secondary | ICD-10-CM | POA: Diagnosis not present

## 2022-01-05 DIAGNOSIS — D1632 Benign neoplasm of short bones of left lower limb: Secondary | ICD-10-CM | POA: Diagnosis not present

## 2022-01-05 DIAGNOSIS — M19072 Primary osteoarthritis, left ankle and foot: Secondary | ICD-10-CM | POA: Diagnosis not present

## 2022-01-05 DIAGNOSIS — M2042 Other hammer toe(s) (acquired), left foot: Secondary | ICD-10-CM | POA: Diagnosis not present

## 2022-01-05 HISTORY — PX: ARTHRODESIS METATARSAL: SHX6565

## 2022-01-05 HISTORY — PX: BONE CYST EXCISION: SHX376

## 2022-01-05 HISTORY — PX: CYST REMOVAL WITH BONE GRAFT: SHX6365

## 2022-01-05 HISTORY — PX: HAMMER TOE SURGERY: SHX385

## 2022-01-05 MED ORDER — GABAPENTIN 300 MG PO CAPS: 300.0000 mg | ORAL_CAPSULE | Freq: Three times a day (TID) | ORAL | 0 refills | Status: DC

## 2022-01-05 MED ORDER — OXYCODONE HCL 5 MG PO TABS: 5.0000 mg | ORAL_TABLET | ORAL | 0 refills | Status: DC | PRN

## 2022-01-05 MED ORDER — RIVAROXABAN 10 MG PO TABS: 10.0000 mg | ORAL_TABLET | Freq: Every day | ORAL | 0 refills | Status: DC

## 2022-01-05 MED ORDER — ACETAMINOPHEN 500 MG PO TABS: 1000.0000 mg | ORAL_TABLET | Freq: Four times a day (QID) | ORAL | 0 refills | Status: AC | PRN

## 2022-01-05 NOTE — Progress Notes (Signed)
01/05/22 left foot midfoot fusion

## 2022-01-10 ENCOUNTER — Ambulatory Visit (INDEPENDENT_AMBULATORY_CARE_PROVIDER_SITE_OTHER): Payer: BC Managed Care – PPO | Admitting: Podiatry

## 2022-01-10 ENCOUNTER — Ambulatory Visit (INDEPENDENT_AMBULATORY_CARE_PROVIDER_SITE_OTHER): Payer: BC Managed Care – PPO

## 2022-01-10 ENCOUNTER — Other Ambulatory Visit: Payer: Self-pay

## 2022-01-10 DIAGNOSIS — M19072 Primary osteoarthritis, left ankle and foot: Secondary | ICD-10-CM

## 2022-01-11 ENCOUNTER — Other Ambulatory Visit: Payer: Self-pay

## 2022-01-11 ENCOUNTER — Telehealth: Payer: Self-pay | Admitting: Podiatry

## 2022-01-11 MED ORDER — OXYCODONE HCL 5 MG PO TABS
5.0000 mg | ORAL_TABLET | ORAL | 0 refills | Status: AC | PRN
  Filled 2022-01-11: qty 20, 5d supply, fill #0

## 2022-01-11 NOTE — Telephone Encounter (Signed)
Pt was seen yesterday for her 1st post op and Dr Sherryle Lis was to have called in a refill on her pain medication and it was not sent in. Could she please get it sent in to [facility] pharmacy.

## 2022-01-11 NOTE — Telephone Encounter (Signed)
Notified pt medication was sent to pharmacy. She said thank you.

## 2022-01-13 NOTE — Progress Notes (Signed)
  Subjective:  Patient ID: Jill Cook, female    DOB: January 28, 1966,  MRN: [id number]  Chief Complaint  Patient presents with   Routine Post Op    POV #1 DOS 01/05/2022 FUSION OF JOINTS IN MIDFOOT, BONE GRAFT FROM HEEL, CORRECTION OF 3RD TOE LEFT F      56 y.o. female returns for post-op check.  Overall doing well having some pain  Review of Systems: Negative except as noted in the HPI. Denies N/V/F/Ch.   Objective:  There were no vitals filed for this visit. There is no height or weight on file to calculate BMI. Constitutional Well developed. Well nourished.  Vascular Foot warm and well perfused. Capillary refill normal to all digits.  Calf is soft and supple, no posterior calf or knee pain, negative Homans' sign  Neurologic Normal speech. Oriented to person, place, and time. Epicritic sensation to light touch grossly present bilaterally.  Dermatologic Skin healing well without signs of infection. Skin edges well coapted without signs of infection.  Orthopedic: Tenderness to palpation noted about the surgical site.   Multiple view plain film radiographs: Hardware intact in good position good apposition of fusion sites radiographs equivalent to immediate postop films Assessment:   1. Osteoarthritis of left ankle and foot    Plan:  Patient was evaluated and treated and all questions answered.  S/p foot surgery left -Progressing as expected post-operatively. -XR: Noted above -WB Status: NWB in CAM boot -Sutures: Removed in 2 weeks. -Medications: Refill of pain medication sent to [place] redressed.  No follow-ups on file.

## 2022-01-15 ENCOUNTER — Ambulatory Visit
Admission: RE | Admit: 2022-01-15 | Discharge: 2022-01-15 | Disposition: A | Discharge status: Home / Self Care | Payer: BC Managed Care – PPO | Source: Ambulatory Visit | Attending: Family Medicine | Admitting: Family Medicine

## 2022-01-15 DIAGNOSIS — Z1231 Encounter for screening mammogram for malignant neoplasm of breast: Secondary | ICD-10-CM | POA: Insufficient documentation

## 2022-01-22 ENCOUNTER — Ambulatory Visit (INDEPENDENT_AMBULATORY_CARE_PROVIDER_SITE_OTHER): Payer: BC Managed Care – PPO | Admitting: Podiatry

## 2022-01-22 DIAGNOSIS — M19072 Primary osteoarthritis, left ankle and foot: Secondary | ICD-10-CM

## 2022-01-22 DIAGNOSIS — S90122A Contusion of left lesser toe(s) without damage to nail, initial encounter: Secondary | ICD-10-CM

## 2022-01-23 ENCOUNTER — Encounter: Payer: Self-pay | Admitting: Podiatry

## 2022-01-24 ENCOUNTER — Encounter: Payer: BC Managed Care – PPO | Admitting: Podiatry

## 2022-01-25 NOTE — Progress Notes (Signed)
  Subjective:  Patient ID: Jill Cook, female    DOB: [birth year],  MRN: [id number]  Chief Complaint  Patient presents with   Routine Post Op     POV #2 DOS 01/05/2022 FUSION OF JOINTS IN MIDFOOT, BONE GRAFT FROM HEEL, CORRECTION OF 3RD TOE LEFT FOOT      56 y.o. female returns for post-op check.  Pain continues to improve  Review of Systems: Negative except as noted in the HPI. Denies N/V/F/Ch.   Objective:  There were no vitals filed for this visit. There is no height or weight on file to calculate BMI. Constitutional Well developed. Well nourished.  Vascular Foot warm and well perfused. Capillary refill normal to all digits.  Calf is soft and supple, no posterior calf or knee pain, negative Homans' sign  Neurologic Normal speech. Oriented to person, place, and time. Epicritic sensation to light touch grossly present bilaterally.  Dermatologic Skin healing well without signs of infection. Skin edges well coapted without signs of infection.  Orthopedic: Tenderness to palpation noted about the surgical site.  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 Osteoarthritis of left ankle and foot   2. Contusion of lesser toe of left foot without damage to nail, initial encounter     Plan:  Patient was evaluated and treated and all questions answered.  S/p foot surgery left -Sutures were removed uneventfully.  She should continue her nonweightbearing for now.  Follow-up in 3 weeks for pin removal and transition to weightbearing  No follow-ups on file.

## 2022-02-08 ENCOUNTER — Telehealth: Payer: Self-pay | Admitting: Podiatry

## 2022-02-08 NOTE — Telephone Encounter (Signed)
Pt called and has been using one of the white socks since surgery and it is looking bad and she is asking if her daughter could come by the [place] office to pick up a new one.

## 2022-02-08 NOTE — Telephone Encounter (Signed)
Notified pt and she may send her husband by to pick up the stockinette tomorrow. She is aware the office closes at 230.

## 2022-02-14 ENCOUNTER — Ambulatory Visit (INDEPENDENT_AMBULATORY_CARE_PROVIDER_SITE_OTHER): Payer: BC Managed Care – PPO

## 2022-02-14 ENCOUNTER — Ambulatory Visit (INDEPENDENT_AMBULATORY_CARE_PROVIDER_SITE_OTHER): Payer: BC Managed Care – PPO | Admitting: Podiatry

## 2022-02-14 DIAGNOSIS — M85679 Other cyst of bone, unspecified ankle and foot: Secondary | ICD-10-CM

## 2022-02-14 DIAGNOSIS — M2042 Other hammer toe(s) (acquired), left foot: Secondary | ICD-10-CM

## 2022-02-14 DIAGNOSIS — M19072 Primary osteoarthritis, left ankle and foot: Secondary | ICD-10-CM

## 2022-02-14 DIAGNOSIS — S90122A Contusion of left lesser toe(s) without damage to nail, initial encounter: Secondary | ICD-10-CM | POA: Diagnosis not present

## 2022-02-19 ENCOUNTER — Encounter: Payer: Self-pay | Admitting: Podiatry

## 2022-02-19 NOTE — Progress Notes (Signed)
  Subjective:  Patient ID: Jill Cook, female    DOB: 02-10-1966,  MRN: [id number]  Chief Complaint  Patient presents with   Routine Post Op    (xray)POV #3 DOS 01/05/2022 FUSION OF JOINTS IN MIDFOOT, BONE GRAFT FROM HEEL, CORRECTION OF 3RD TOE LEFT FOOT      56 y.o. female returns for post-op check.  Doing well she is not having much pain  Review of Systems: Negative except as noted in the HPI. Denies N/V/F/Ch.   Objective:  There were no vitals filed for this visit. There is no height or weight on file to calculate BMI. Constitutional Well developed. Well nourished.  Vascular Foot warm and well perfused. Capillary refill normal to all digits.  Calf is soft and supple, no posterior calf or knee pain, negative Homans' sign  Neurologic Normal speech. Oriented to person, place, and time. Epicritic sensation to light touch grossly present bilaterally.  Dermatologic Skin healing well without signs of infection. Skin edges well coapted without signs of infection.  Orthopedic: There is minimal tenderness to palpation noted about the surgical site.  Minimal edema   Multiple view plain film radiographs: Good consolidation beginning across arthrodesis sites  Path results show bone mass with osteonecrosis benign Assessment:   1. Osteoarthritis of left ankle and foot   2. Bone cyst of foot   3. Contusion of lesser toe of left foot without damage to nail, initial encounter     Plan:  Patient was evaluated and treated and all questions answered.  S/p foot surgery left -Overall doing well.  Kirschner wire was removed today uneventfully.  She may continue to be WBAT in the cam boot, like to see her back in 4 weeks for new radiographs before transitioning back to regular shoe gear.  Return in about 4 weeks (around 03/14/2022) for post op w/new xrays.

## 2022-02-21 ENCOUNTER — Encounter: Payer: BC Managed Care – PPO | Admitting: Podiatry

## 2022-03-07 ENCOUNTER — Encounter: Payer: BC Managed Care – PPO | Admitting: Podiatry

## 2022-03-19 ENCOUNTER — Encounter: Payer: BC Managed Care – PPO | Admitting: Podiatry

## 2022-03-21 ENCOUNTER — Ambulatory Visit: Payer: BC Managed Care – PPO

## 2022-03-21 ENCOUNTER — Encounter: Payer: Self-pay | Admitting: Podiatry

## 2022-03-21 ENCOUNTER — Ambulatory Visit (INDEPENDENT_AMBULATORY_CARE_PROVIDER_SITE_OTHER): Payer: BC Managed Care – PPO | Admitting: Podiatry

## 2022-03-21 DIAGNOSIS — M85679 Other cyst of bone, unspecified ankle and foot: Secondary | ICD-10-CM

## 2022-03-21 DIAGNOSIS — M19072 Primary osteoarthritis, left ankle and foot: Secondary | ICD-10-CM

## 2022-03-21 DIAGNOSIS — Z9889 Other specified postprocedural states: Secondary | ICD-10-CM

## 2022-03-21 DIAGNOSIS — M7751 Other enthesopathy of right foot: Secondary | ICD-10-CM

## 2022-03-21 DIAGNOSIS — M19271 Secondary osteoarthritis, right ankle and foot: Secondary | ICD-10-CM

## 2022-03-26 ENCOUNTER — Other Ambulatory Visit: Payer: Self-pay | Admitting: Family Medicine

## 2022-03-26 DIAGNOSIS — F341 Dysthymic disorder: Secondary | ICD-10-CM

## 2022-03-26 DIAGNOSIS — F410 Panic disorder [episodic paroxysmal anxiety] without agoraphobia: Secondary | ICD-10-CM

## 2022-03-26 NOTE — Progress Notes (Signed)
  Subjective:  Patient ID: Jill Cook, female    DOB: 1965-10-06,  MRN: [id number]  Chief Complaint  Patient presents with   Routine Post Op    "It's doing good.  Every now and then, I get a little twinge of pain.  It's swelling."   Foot Pain    "There's a lump on top of my right foot.  He said he'd x-ray it today."      56 y.o. female returns for post-op check.  Over all she is doing very well  Review of Systems: Negative except as noted in the HPI. Denies N/V/F/Ch.   Objective:  There were no vitals filed for this visit. There is no height or weight on file to calculate BMI. Constitutional Well developed. Well nourished.  Vascular Foot warm and well perfused. Capillary refill normal to all digits.  Calf is soft and supple, no posterior calf or knee pain, negative Homans' sign  Neurologic Normal speech. Oriented to person, place, and time. Epicritic sensation to light touch grossly present bilaterally.  Dermatologic Incision is well-healed and not hypertrophic  Orthopedic: There is minimal tenderness to palpation noted about the surgical site.  Minimal edema   Multiple view plain film radiographs: Her left foot shows good consolidation across the fusion sites; the right foot shows moderate degenerative changes in the second and third tarsometatarsal joints with slight dorsal spurring  Path results show bone mass with osteonecrosis benign Assessment:   1. Bone cyst of foot   2. Status post surgery     Plan:  Patient was evaluated and treated and all questions answered.  S/p foot surgery left -Appears to have bony union at this point.  She may return to regular shoe gear.  Avoid impact activity for now but may begin low impact exercise.  I will see her back for a 85-monthfollow-up visit from surgery  Regarding her right foot she does have osteoarthritic changes noted in the midtarsal joint of the right foot.  We discussed this in the presence of her x-rays  today.  I think eventually she likely will need surgical intervention for this but until it is more bothersome we will hold off on proceeding with this  Return in about 16 weeks (around 07/11/2022) for surgery 6 month follow up (new xrays of left foot).

## 2022-03-27 ENCOUNTER — Other Ambulatory Visit: Payer: Self-pay | Admitting: Family Medicine

## 2022-03-27 ENCOUNTER — Other Ambulatory Visit: Payer: Self-pay

## 2022-03-27 DIAGNOSIS — F341 Dysthymic disorder: Secondary | ICD-10-CM

## 2022-03-27 DIAGNOSIS — F410 Panic disorder [episodic paroxysmal anxiety] without agoraphobia: Secondary | ICD-10-CM

## 2022-03-27 NOTE — Telephone Encounter (Signed)
Lvm for pt to call back and schedule an appt 

## 2022-03-27 NOTE — Progress Notes (Unsigned)
Name: Jill Cook   MRN: [id number]    DOB: [birth year]   Date:03/28/2022       Progress Note  Subjective  Chief Complaint  Medication Refill  HPI  Palpitation : feels like a pounding sensation, sometimes skips a beat. She feels nervous during the episodes. Not associated with nausea, vomiting or diaphoresis. She used to take metoprolol for HTN  but stopped, discussed going back at half dose to see if episodes will be better controlled , episodes are occasional, discussed Zio patch, but we can resume metoprolol now and monitor   IBS: she feels bloated, intermittent abdominal pain, mostly constipation type .She takes Amitiza prn and is doing well   Left foot pain:she had surgery still wearing ortho shoe, swelling still present , pain is mild now   GERD: she was on Omeprazole prn only and is doing well.   Depression/Anxiety: she is doing better, she was able to talk to her daughter and is only going to watch grand-daughter until early evening, she is also able to cope better with stress, trying to have more time for herself.    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 08/22/2015   Generalized anxiety disorder 08/17/2015   Depression 08/17/2015   Gastroesophageal reflux disease with esophagitis without hemorrhage 10/14/2008   Irritable bowel syndrome 04/12/2008    Past Surgical History:  Procedure Laterality Date   ANAL FISSURE REPAIR  06/13/2000   ARTHRODESIS METATARSAL Left 01/05/2022   [facility]   BONE CYST EXCISION Left 01/05/2022   [facility]   BUNIONECTOMY Right 07/30/1989   CESAREAN SECTION   07/30/1992   COLONOSCOPY WITH PROPOFOL N/A 06/03/2019   Procedure: COLONOSCOPY WITH PROPOFOL;  Surgeon: Virgel Manifold, MD;  Location: [facility];  Service: Endoscopy;  Laterality: N/A;   CONVERSION TO TOTAL KNEE Right 08/22/2015   Procedure: partial knee CONVERSION TO RIGHT TOTAL KNEE;  Surgeon: Paralee Cancel, MD;  Location: [facility];  Service: Orthopedics;  Laterality: Right;   CYST REMOVAL WITH BONE GRAFT Left 01/05/2022   [facility]   D & C HYSTEROSCOPY /  NOVASURE ENDOMETRIAL ABLATION  11/15/2005   ESOPHAGOGASTRODUODENOSCOPY (EGD) WITH PROPOFOL N/A 06/03/2019   Procedure: ESOPHAGOGASTRODUODENOSCOPY (EGD) WITH PROPOFOL;  Surgeon: Virgel Manifold, MD;  Location: [facility];  Service: Endoscopy;  Laterality: N/A;   HAMMER TOE SURGERY Left 01/05/2022   KNEE ARTHROSCOPY W/ MENISCECTOMY Right    KNEE ARTHROSCOPY WITH MEDIAL MENISECTOMY Right 10/06/2013   Procedure: RIGHT KNEE ARTHROSCOPY WITH CHONDROPLASTY;  Surgeon: Sydnee Cabal, MD;  Location[facility];  Service: Orthopedics;  Laterality: Right;   LAPAROSCOPY N/A 11/01/2015   Procedure: LAPAROSCOPY DIAGNOSTIC;  Surgeon: Molli Posey, MD;  Location[facility];  Service: Gynecology;  Laterality: N/A;   RIGHT KNEE PATELLOFEMORAL ARTHROPLASTY  06/10/2008   SEPTOPLASTY  07/30/1998   TONSILLECTOMY  AGE 56   TRANSTHORACIC ECHOCARDIOGRAM  06/03/2008   NORMAL /  EF 60%   TUBAL LIGATION  07/30/1994    Family History  Problem Relation Age of Onset   Hypertension Mother    Thyroid disease Mother  Atrial fibrillation Mother    Stroke Mother    Heart disease Father    Heart attack Father 63   Colon cancer Maternal Aunt        dx in her 58's   Breast cancer Paternal Aunt    Uterine cancer Paternal Aunt    Esophageal cancer Maternal Grandfather    Hypertension Paternal Grandfather    Diabetes Paternal Grandfather     Social History   Tobacco Use   Smoking status: Never   Smokeless  tobacco: Never  Substance Use Topics   Alcohol use: Yes    Comment: 1 drink per month     Current Outpatient Medications:    ALPRAZolam (XANAX) 0.5 MG tablet, Take 1 tablet (0.5 mg total) by mouth daily as needed for anxiety., Disp: 5 tablet, Rfl: 0   aspirin EC 81 MG tablet, Take 81 mg by mouth daily., Disp: , Rfl:    Cholecalciferol (VITAMIN D) 50 MCG (2000 UT) tablet, Take 2,000 Units by mouth daily., Disp: , Rfl:    lubiprostone (AMITIZA) 24 MCG capsule, Take 1 capsule (24 mcg total) by mouth 2 (two) times daily with a meal., Disp: 180 capsule, Rfl: 1   metoprolol succinate (TOPROL-XL) 25 MG 24 hr tablet, Take 0.5 tablets (12.5 mg total) by mouth daily., Disp: 45 tablet, Rfl: 1   Multiple Vitamin (MULTIVITAMIN) tablet, Take 1 tablet by mouth daily., Disp: , Rfl:    omeprazole (PRILOSEC) 40 MG capsule, Take 1 capsule (40 mg total) by mouth daily., Disp: 90 capsule, Rfl: 0   Zoster Vaccine Adjuvanted (SHINGRIX) injection, Inject 0.5 mLs into the muscle once for 1 dose., Disp: 0.5 mL, Rfl: 1   DULoxetine (CYMBALTA) 30 MG capsule, Take 1-2 capsules (30-60 mg total) by mouth daily., Disp: 90 capsule, Rfl: 1  Allergies  Allergen Reactions   Codeine Nausea And Vomiting and Hives   Morphine Hives and Nausea And Vomiting   Hydrocodone Nausea Only   Hydromorphone Hcl Nausea Only    I personally reviewed active problem list, medication list, allergies, family history, social history, health maintenance with the patient/caregiver today.   ROS  Constitutional: Negative for fever or weight change.  Respiratory: Negative for cough and shortness of breath.   Cardiovascular: Negative for chest pain or palpitations.  Gastrointestinal: Negative for abdominal pain, no bowel changes.  Musculoskeletal: Negative for gait problem or joint swelling.  Skin: Negative for rash.  Neurological: Negative for dizziness or headache.  No other specific complaints in a complete review of systems (except as  listed in HPI above).   Objective  Vitals:   03/28/22 1410  BP: 132/70  Pulse: 94  Resp: 16  Temp: 97.7 F (36.5 C)  TempSrc: Oral  SpO2: 96%  Weight: 217 lb 11.2 oz (98.7 kg)  Height: '5\' 6"'$  (1.676 m)    Body mass index is 35.14 kg/m.  Physical Exam  Constitutional: Patient appears well-developed and well-nourished. Obese No distress.  HEENT: head atraumatic, normocephalic, pupils equal and reactive to light, neck supple Cardiovascular: Normal rate, regular rhythm and normal heart sounds.  No murmur heard. No BLE edema. Pulmonary/Chest: Effort normal and breath sounds normal. No respiratory distress. Abdominal: Soft.  There is no tenderness. Psychiatric: Patient has a normal mood and affect. behavior is normal. Judgment and thought content normal.    PHQ2/9:    03/28/2022    2:04 PM 10/31/2021    1:06 PM 05/30/2021    8:33 AM 01/17/2021    2:11 PM 04/15/2020  3:59 PM  Depression screen PHQ 2/9  Decreased Interest 0 0 0 0 0  Down, Depressed, Hopeless 0 1 0 0 0  PHQ - 2 Score 0 1 0 0 0  Altered sleeping 0 2 0 1   Tired, decreased energy 0 3 0 1   Change in appetite 0 0 0 0   Feeling bad or failure about yourself  0 0 0 0   Trouble concentrating 0 0 0 0   Moving slowly or fidgety/restless 0 0 0 0   Suicidal thoughts 0 0 0 0   PHQ-9 Score 0 6 0 2   Difficult doing work/chores Not difficult at all  Not difficult at all      phq 9 is negative   Fall Risk:    03/28/2022    2:03 PM 10/31/2021    1:06 PM 05/30/2021    8:33 AM 01/17/2021    2:11 PM 04/15/2020    3:58 PM  Fall Risk   Falls in the past year? 0 0 0 1 0  Number falls in past yr: 0 0 0 1 0  Injury with Fall? 0 0 0 0 0  Risk for fall due to : No Fall Risks No Fall Risks     Follow up Falls prevention discussed;Education provided Falls prevention discussed         Functional Status Survey: Is the patient deaf or have difficulty hearing?: No Does the patient have difficulty seeing, even when wearing  glasses/contacts?: No Does the patient have difficulty concentrating, remembering, or making decisions?: No Does the patient have difficulty walking or climbing stairs?: No Does the patient have difficulty dressing or bathing?: No Does the patient have difficulty doing errands alone such as visiting a doctor's office or shopping?: No    Assessment & Plan  1. Dysthymia  - DULoxetine (CYMBALTA) 30 MG capsule; Take 1-2 capsules (30-60 mg total) by mouth daily.  Dispense: 90 capsule; Refill: 1  2. Panic attack  - DULoxetine (CYMBALTA) 30 MG capsule; Take 1-2 capsules (30-60 mg total) by mouth daily.  Dispense: 90 capsule; Refill: 1  3. Palpitations  - metoprolol succinate (TOPROL-XL) 25 MG 24 hr tablet; Take 0.5 tablets (12.5 mg total) by mouth daily.  Dispense: 45 tablet; Refill: 1  4. Obesity (BMI 35.0-39.9 without comorbidity)  Discussed with the patient the risk posed by an increased BMI. Discussed importance of portion control, calorie counting and at least 150 minutes of physical activity weekly. Avoid sweet beverages and drink more water. Eat at least 6 servings of fruit and vegetables daily    5. Irritable bowel syndrome with both constipation and diarrhea   6. Anxiety   7. Need for shingles vaccine  - Zoster Vaccine Adjuvanted [facility]) injection; Inject 0.5 mLs into the muscle once for 1 dose.  Dispense: 0.5 mL; Refill: 1  8. Need for influenza vaccination  - Flu Vaccine QUAD 70moIM (Fluarix, Fluzone & Alfiuria Quad PF)  9. Gastroesophageal reflux disease with esophagitis without hemorrhage

## 2022-03-28 ENCOUNTER — Other Ambulatory Visit: Payer: Self-pay

## 2022-03-28 ENCOUNTER — Ambulatory Visit: Payer: BC Managed Care – PPO | Admitting: Family Medicine

## 2022-03-28 ENCOUNTER — Encounter: Payer: Self-pay | Admitting: Family Medicine

## 2022-03-28 ENCOUNTER — Encounter: Payer: BC Managed Care – PPO | Admitting: Podiatry

## 2022-03-28 VITALS — BP 132/70 | HR 94 | Temp 97.7°F | Resp 16 | Ht 66.0 in | Wt 217.7 lb

## 2022-03-28 DIAGNOSIS — Z23 Encounter for immunization: Secondary | ICD-10-CM | POA: Diagnosis not present

## 2022-03-28 DIAGNOSIS — E669 Obesity, unspecified: Secondary | ICD-10-CM

## 2022-03-28 DIAGNOSIS — F419 Anxiety disorder, unspecified: Secondary | ICD-10-CM

## 2022-03-28 DIAGNOSIS — F410 Panic disorder [episodic paroxysmal anxiety] without agoraphobia: Secondary | ICD-10-CM | POA: Diagnosis not present

## 2022-03-28 DIAGNOSIS — R002 Palpitations: Secondary | ICD-10-CM | POA: Diagnosis not present

## 2022-03-28 DIAGNOSIS — F341 Dysthymic disorder: Secondary | ICD-10-CM | POA: Diagnosis not present

## 2022-03-28 DIAGNOSIS — K582 Mixed irritable bowel syndrome: Secondary | ICD-10-CM

## 2022-03-28 DIAGNOSIS — K2100 Gastro-esophageal reflux disease with esophagitis, without bleeding: Secondary | ICD-10-CM

## 2022-03-28 MED ORDER — METOPROLOL SUCCINATE ER 25 MG PO TB24
12.5000 mg | ORAL_TABLET | Freq: Every day | ORAL | 1 refills | Status: DC
  Filled 2022-03-28: qty 45, 90d supply, fill #0
  Filled 2022-07-18: qty 45, 90d supply, fill #1

## 2022-03-28 MED ORDER — SHINGRIX 50 MCG/0.5ML IM SUSR
0.5000 mL | Freq: Once | INTRAMUSCULAR | 1 refills | Status: AC
  Filled 2022-03-28: qty 0.5, 1d supply, fill #0

## 2022-03-28 MED ORDER — DULOXETINE HCL 30 MG PO CPEP
30.0000 mg | ORAL_CAPSULE | Freq: Every day | ORAL | 1 refills | Status: DC
  Filled 2022-03-28: qty 90, 45d supply, fill #0
  Filled 2022-07-18: qty 90, 45d supply, fill #1

## 2022-03-29 ENCOUNTER — Other Ambulatory Visit: Payer: Self-pay

## 2022-07-09 ENCOUNTER — Other Ambulatory Visit: Payer: Self-pay

## 2022-07-09 ENCOUNTER — Telehealth: Payer: BC Managed Care – PPO | Admitting: Physician Assistant

## 2022-07-09 DIAGNOSIS — J069 Acute upper respiratory infection, unspecified: Secondary | ICD-10-CM

## 2022-07-09 MED ORDER — PSEUDOEPH-BROMPHEN-DM 30-2-10 MG/5ML PO SYRP
5.0000 mL | ORAL_SOLUTION | Freq: Four times a day (QID) | ORAL | 0 refills | Status: DC | PRN
  Filled 2022-07-09: qty 118, 6d supply, fill #0

## 2022-07-09 MED ORDER — BENZONATATE 100 MG PO CAPS
100.0000 mg | ORAL_CAPSULE | Freq: Three times a day (TID) | ORAL | 0 refills | Status: DC | PRN
  Filled 2022-07-09: qty 30, 10d supply, fill #0

## 2022-07-09 MED ORDER — AZELASTINE HCL 0.1 % NA SOLN
1.0000 | Freq: Two times a day (BID) | NASAL | 0 refills | Status: DC
  Filled 2022-07-09: qty 30, 30d supply, fill #0

## 2022-07-09 NOTE — Progress Notes (Signed)
E-Visit for Upper Respiratory Infection   We are sorry you are not feeling well.  Here is how we plan to help!  Based on what you have shared with me, it looks like you may have a viral upper respiratory infection.  Upper respiratory infections are caused by a large number of viruses; however, rhinovirus is the most common cause.   Symptoms vary from person to person, with common symptoms including sore throat, cough, fatigue or lack of energy and feeling of general discomfort.  A low-grade fever of up to 100.4 may present, but is often uncommon.  Symptoms vary however, and are closely related to a person's age or underlying illnesses.  The most common symptoms associated with an upper respiratory infection are nasal discharge or congestion, cough, sneezing, headache and pressure in the ears and face.  These symptoms usually persist for about 3 to 10 days, but can last up to 2 weeks.  It is important to know that upper respiratory infections do not cause serious illness or complications in most cases.    Upper respiratory infections can be transmitted from person to person, with the most common method of transmission being a person's hands.  The virus is able to live on the skin and can infect other persons for up to 2 hours after direct contact.  Also, these can be transmitted when someone coughs or sneezes; thus, it is important to cover the mouth to reduce this risk.  To keep the spread of the illness at [place], good hand hygiene is very important.  This is an infection that is most likely caused by a virus. There are no specific treatments other than to help you with the symptoms until the infection runs its course.  We are sorry you are not feeling well.  Here is how we plan to help!   For nasal congestion, you may use an oral decongestants such as Mucinex D or if you have glaucoma or high blood pressure use plain Mucinex.  Saline nasal spray or nasal drops can help and can safely be used as often as  needed for congestion.  For your congestion, I have prescribed Azelastine nasal spray two sprays in each nostril twice a day  If you do not have a history of heart disease, hypertension, diabetes or thyroid disease, prostate/bladder issues or glaucoma, you may also use Sudafed to treat nasal congestion.  It is highly recommended that you consult with a pharmacist or your primary care physician to ensure this medication is safe for you to take.     If you have a cough, you may use cough suppressants such as Delsym and Robitussin.  If you have glaucoma or high blood pressure, you can also use Coricidin HBP.   For cough I have prescribed for you A prescription cough medication called Tessalon Perles 100 mg. You may take 1-2 capsules every 8 hours as needed for cough and Bromfed DM cough syrup Take 5 mL every 6 hours as needed for cough.  If you have a sore or scratchy throat, use a saltwater gargle-  to  teaspoon of salt dissolved in a 4-ounce to 8-ounce glass of warm water.  Gargle the solution for approximately 15-30 seconds and then spit.  It is important not to swallow the solution.  You can also use throat lozenges/cough drops and Chloraseptic spray to help with throat pain or discomfort.  Warm or cold liquids can also be helpful in relieving throat pain.  For headache, pain or  general discomfort, you can use Ibuprofen or Tylenol as directed.   Some authorities believe that zinc sprays or the use of Echinacea may shorten the course of your symptoms.   HOME CARE Only take medications as instructed by your medical team. Be sure to drink plenty of fluids. Water is fine as well as fruit juices, sodas and electrolyte beverages. You may want to stay away from caffeine or alcohol. If you are nauseated, try taking small sips of liquids. How do you know if you are getting enough fluid? Your urine should be a pale yellow or almost colorless. Get rest. Taking a steamy shower or using a humidifier may help  nasal congestion and ease sore throat pain. You can place a towel over your head and breathe in the steam from hot water coming from a faucet. Using a saline nasal spray works much the same way. Cough drops, hard candies and sore throat lozenges may ease your cough. Avoid close contacts especially the very young and the elderly Cover your mouth if you cough or sneeze Always remember to wash your hands.   GET HELP RIGHT AWAY IF: You develop worsening fever. If your symptoms do not improve within 10 days You develop yellow or green discharge from your nose over 3 days. You have coughing fits You develop a severe head ache or visual changes. You develop shortness of breath, difficulty breathing or start having chest pain Your symptoms persist after you have completed your treatment plan  MAKE SURE YOU  Understand these instructions. Will watch your condition. Will get help right away if you are not doing well or get worse.  Thank you for choosing an e-visit.  Your e-visit answers were reviewed by a board certified advanced clinical practitioner to complete your personal care plan. Depending upon the condition, your plan could have included both over the counter or prescription medications.  Please review your pharmacy choice. Make sure the pharmacy is open so you can pick up prescription now. If there is a problem, you may contact your provider through [employer] and have the prescription routed to another pharmacy.  Your safety is important to [place]. If you have drug allergies check your prescription carefully.   For the next 24 hours you can use MyChart to ask questions about today's visit, request a non-urgent call back, or ask for a work or school excuse. You will get an email in the next two days asking about your experience. I hope that your e-visit has been valuable and will speed your recovery.  I have spent 5 minutes in review of e-visit questionnaire, review and updating  patient chart, medical decision making and response to patient.   Mar Daring, PA-C

## 2022-07-11 ENCOUNTER — Ambulatory Visit
Admission: EM | Admit: 2022-07-11 | Discharge: 2022-07-11 | Disposition: A | Discharge status: Home / Self Care | Payer: BC Managed Care – PPO

## 2022-07-11 ENCOUNTER — Ambulatory Visit: Payer: BC Managed Care – PPO | Admitting: Podiatry

## 2022-07-11 ENCOUNTER — Ambulatory Visit (INDEPENDENT_AMBULATORY_CARE_PROVIDER_SITE_OTHER): Payer: BC Managed Care – PPO

## 2022-07-11 DIAGNOSIS — M19072 Primary osteoarthritis, left ankle and foot: Secondary | ICD-10-CM

## 2022-07-11 DIAGNOSIS — Z9889 Other specified postprocedural states: Secondary | ICD-10-CM

## 2022-07-11 DIAGNOSIS — J069 Acute upper respiratory infection, unspecified: Secondary | ICD-10-CM

## 2022-07-11 NOTE — ED Triage Notes (Signed)
Pt. Presents to UC w/ c/o a productive cough, sinus pressure, loss voice and left ear pain for the past 3 days.

## 2022-07-11 NOTE — Discharge Instructions (Signed)
Follow up here or with your primary care provider if your symptoms are worsening or not improving.     

## 2022-07-11 NOTE — ED Provider Notes (Signed)
Roderic Palau    CSN: [id number] Arrival date & time: 07/11/22  1312      History   Chief Complaint Chief Complaint  Patient presents with   Otalgia   Facial Pain   Cough   Hoarse    HPI Jill Cook is a 56 y.o. female.    Otalgia Associated symptoms: cough   Cough Associated symptoms: ear pain     Presents to urgent care with complaint of productive cough, sinus pressure, laryngitis, left ear pain x 3 days.  She is using Sudafed sinus for her symptoms with some relief however she endorses worsening symptoms with the onset of ear pain starting yesterday.  Denies fever, chills, myalgias.  Cough is usually triggered by "talking too much".  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 08/22/2015   Generalized anxiety disorder 08/17/2015   Depression 08/17/2015   Gastroesophageal reflux disease with esophagitis without hemorrhage 10/14/2008   Irritable bowel syndrome 04/12/2008    Past Surgical History:  Procedure Laterality Date   ANAL FISSURE REPAIR  06/13/2000   ARTHRODESIS METATARSAL Left 01/05/2022   [facility]   BONE CYST EXCISION Left 01/05/2022   [facility]   BUNIONECTOMY Right 07/30/1989   CESAREAN SECTION  07/30/1992   COLONOSCOPY WITH PROPOFOL N/A 06/03/2019   Procedure: COLONOSCOPY WITH PROPOFOL;  Surgeon: Virgel Manifold, MD;  Location: [facility];   Service: Endoscopy;  Laterality: N/A;   CONVERSION TO TOTAL KNEE Right 08/22/2015   Procedure: partial knee CONVERSION TO RIGHT TOTAL KNEE;  Surgeon: Paralee Cancel, MD;  Location: [facility];  Service: Orthopedics;  Laterality: Right;   CYST REMOVAL WITH BONE GRAFT Left 01/05/2022   [facility]   D & C HYSTEROSCOPY /  NOVASURE ENDOMETRIAL ABLATION  11/15/2005   ESOPHAGOGASTRODUODENOSCOPY (EGD) WITH PROPOFOL N/A 06/03/2019   Procedure: ESOPHAGOGASTRODUODENOSCOPY (EGD) WITH PROPOFOL;  Surgeon: Virgel Manifold, MD;  Location: [facility];  Service: Endoscopy;  Laterality: N/A;   HAMMER TOE SURGERY Left 01/05/2022   KNEE ARTHROSCOPY W/ MENISCECTOMY Right    KNEE ARTHROSCOPY WITH MEDIAL MENISECTOMY Right 10/06/2013   Procedure: RIGHT KNEE ARTHROSCOPY WITH CHONDROPLASTY;  Surgeon: Sydnee Cabal, MD;  Location[facility];  Service: Orthopedics;  Laterality: Right;   LAPAROSCOPY N/A 11/01/2015   Procedure: LAPAROSCOPY DIAGNOSTIC;  Surgeon: Molli Posey, MD;  Location: [facility];  Service: Gynecology;  Laterality: N/A;   RIGHT KNEE PATELLOFEMORAL ARTHROPLASTY  06/10/2008   SEPTOPLASTY  07/30/1998   TONSILLECTOMY  AGE 59   TRANSTHORACIC ECHOCARDIOGRAM  06/03/2008   NORMAL /  EF 60%   TUBAL LIGATION  07/30/1994    OB History   No obstetric history on file.      Home Medications    Prior to Admission medications   Medication Sig  Start Date End Date Taking? Authorizing Provider  ALPRAZolam Duanne Moron) 0.5 MG tablet Take 1 tablet (0.5 mg total) by mouth daily as needed for anxiety. 10/31/21   Steele Sizer, MD  aspirin EC 81 MG tablet Take 81 mg by mouth daily.    [provider]  azelastine (ASTELIN) 0.1 % nasal spray Place 1 spray into both nostrils 2 (two) times daily. Use in each nostril as directed 07/09/22   Mar Daring, PA-C  benzonatate (TESSALON) 100 MG capsule Take 1 capsule (100 mg total) by mouth 3 (three) times daily as needed.  07/09/22   Mar Daring, PA-C  brompheniramine-pseudoephedrine-DM 30-2-10 MG/5ML syrup Take 5 mLs by mouth 4 (four) times daily as needed. 07/09/22   Mar Daring, PA-C  Cholecalciferol (VITAMIN D) 50 MCG (2000 UT) tablet Take 2,000 Units by mouth daily.    [provider]  DULoxetine (CYMBALTA) 30 MG capsule Take 1-2 capsules (30-60 mg total) by mouth daily. 03/28/22   Steele Sizer, MD  lubiprostone (AMITIZA) 24 MCG capsule Take 1 capsule (24 mcg total) by mouth 2 (two) times daily with a meal. 10/31/21   Ancil Boozer, Drue Stager, MD  metoprolol succinate (TOPROL-XL) 25 MG 24 hr tablet Take 0.5 tablets (12.5 mg total) by mouth daily. 03/28/22   Steele Sizer, MD  Multiple Vitamin (MULTIVITAMIN) tablet Take 1 tablet by mouth daily.    [provider]  omeprazole (PRILOSEC) 40 MG capsule Take 1 capsule (40 mg total) by mouth daily. 01/17/21   Steele Sizer, MD    Family History Family History  Problem Relation Age of Onset   Hypertension Mother    Thyroid disease Mother    Atrial fibrillation Mother    Stroke Mother    Heart disease Father    Heart attack Father 1   Colon cancer Maternal Aunt        dx in her 68's   Breast cancer Paternal Aunt    Uterine cancer Paternal Aunt    Esophageal cancer Maternal Grandfather    Hypertension Paternal Grandfather    Diabetes Paternal Grandfather     Social History Social History   Tobacco Use   Smoking status: Never   Smokeless tobacco: Never  Vaping Use   Vaping Use: Never used  Substance Use Topics   Alcohol use: Yes    Comment: 1 drink per month   Drug use: No     Allergies   Codeine, Morphine, Hydrocodone, and Hydromorphone hcl   Review of Systems Review of Systems  HENT:  Positive for ear pain.   Respiratory:  Positive for cough.      Physical Exam Triage Vital Signs ED Triage Vitals [07/11/22 1336]  Enc Vitals Group     BP (!) 140/84     Pulse Rate (!) 106     Resp 16     Temp 97.7 F  (36.5 C)     Temp src      SpO2 95 %     Weight      Height      Head Circumference      Peak Flow      Pain Score 0     Pain Loc      Pain Edu?      Excl. in [place]?    No data found.  Updated Vital Signs BP (!) 140/84   Pulse (!) 106   Temp 97.7 F (36.5 C)   Resp 16   SpO2 95%   Visual  Acuity Right Eye Distance:   Left Eye Distance:   Bilateral Distance:    Right Eye Near:   Left Eye Near:    Bilateral Near:     Physical Exam Vitals reviewed.  Constitutional:      Appearance: Normal appearance. She is ill-appearing.  HENT:     Right Ear: Tympanic membrane normal.     Left Ear: Tympanic membrane normal.     Mouth/Throat:     Pharynx: Posterior oropharyngeal erythema present. No oropharyngeal exudate.  Cardiovascular:     Rate and Rhythm: Normal rate and regular rhythm.     Pulses: Normal pulses.     Heart sounds: Normal heart sounds.  Pulmonary:     Effort: Pulmonary effort is normal.     Breath sounds: Normal breath sounds.  Skin:    General: Skin is warm.  Neurological:     General: No focal deficit present.     Mental Status: She is alert and oriented to person, place, and time.  Psychiatric:        Mood and Affect: Mood normal.        Behavior: Behavior normal.      UC Treatments / Results  Labs (all labs ordered are listed, but only abnormal results are displayed) Labs Reviewed - No data to display  EKG   Radiology No results found.  Procedures Procedures (including critical care time)  Medications Ordered in UC Medications - No data to display  Initial Impression / Assessment and Plan / UC Course  I have reviewed the triage vital signs and the nursing notes.  Pertinent labs & imaging results that were available during my care of the patient were reviewed by me and considered in my medical decision making (see chart for details).   Patient is afebrile here without recent antipyretics. Satting well on room air. Overall is ill  appearing, though well hydrated and without respiratory distress. Pulmonary exam is unremarkable.  Lungs CTAB without wheezes, rhonchi, rales.  TMs are WNL bilaterally.  Pharynx is erythematous without presence of peritonsillar exudates.  Suspect viral process and recommending continued use of OTC medication for symptom control.  Discussed prescribing prednisone for severe symptoms but she declined and prefers to continue with Sudafed sinus.  Final Clinical Impressions(s) / UC Diagnoses   Final diagnoses:  None   Discharge Instructions   None    ED Prescriptions   None    PDMP not reviewed this encounter.   Rose Phi,  07/11/22 1347

## 2022-07-12 ENCOUNTER — Ambulatory Visit: Payer: Self-pay

## 2022-07-12 NOTE — Progress Notes (Signed)
  Subjective:  Patient ID: Jill Cook, female    DOB: 04/30/1966,  MRN: [id number]  Chief Complaint  Patient presents with   Arthritis    surgery 6 month follow up (new xrays of left foot).      56 y.o. female returns for post-op check.  She continues to do well she not having any pain now other than some occasional pain on the ball of the foot  Review of Systems: Negative except as noted in the HPI. Denies N/V/F/Ch.   Objective:  There were no vitals filed for this visit. There is no height or weight on file to calculate BMI. Constitutional Well developed. Well nourished.  Vascular Foot warm and well perfused. Capillary refill normal to all digits.  Calf is soft and supple, no posterior calf or knee pain, negative Homans' sign  Neurologic Normal speech. Oriented to person, place, and time. Epicritic sensation to light touch grossly present bilaterally.  Dermatologic Incision is well-healed and not hypertrophic  Orthopedic: She has no pain at the surgical site.  She does have some tenderness in the metatarsal area   Multiple view plain film radiographs: Good fusion across arthrodesis site, there is no complication of hardware  Path results show bone mass with osteonecrosis benign Assessment:   1. Osteoarthritis of left ankle and foot   2. Status post surgery     Plan:  Patient was evaluated and treated and all questions answered.  S/p foot surgery left -Doing very well there is no complication of hardware she is complete fusion across the arthrodesis sites.  She may continue her activity as tolerated.  I will see her back as needed at this point, we discussed supporting the foot even on hardwoods and in the house with a supportive shoe due to the fact that there is decreased flexibility of the midtarsal joint now with her fusion.  Return if symptoms worsen or fail to improve.

## 2022-07-18 ENCOUNTER — Other Ambulatory Visit: Payer: Self-pay

## 2022-07-18 ENCOUNTER — Telehealth: Payer: BC Managed Care – PPO | Admitting: Family Medicine

## 2022-07-18 DIAGNOSIS — R052 Subacute cough: Secondary | ICD-10-CM | POA: Diagnosis not present

## 2022-07-18 DIAGNOSIS — J019 Acute sinusitis, unspecified: Secondary | ICD-10-CM | POA: Diagnosis not present

## 2022-07-18 DIAGNOSIS — B9689 Other specified bacterial agents as the cause of diseases classified elsewhere: Secondary | ICD-10-CM

## 2022-07-18 MED ORDER — PREDNISONE 20 MG PO TABS
40.0000 mg | ORAL_TABLET | Freq: Every day | ORAL | 0 refills | Status: AC
  Filled 2022-07-18: qty 10, 5d supply, fill #0

## 2022-07-18 MED ORDER — AMOXICILLIN-POT CLAVULANATE 875-125 MG PO TABS
1.0000 | ORAL_TABLET | Freq: Two times a day (BID) | ORAL | 0 refills | Status: AC
  Filled 2022-07-18: qty 14, 7d supply, fill #0

## 2022-07-18 NOTE — Progress Notes (Signed)
E-Visit for Sinus Problems  We are sorry that you are not feeling well.  Here is how we plan to help!  Based on what you have shared with me it looks like you have sinusitis.  Sinusitis is inflammation and infection in the sinus cavities of the head.  Based on your presentation I believe you most likely have Acute Bacterial Sinusitis.  This is an infection caused by bacteria and is treated with antibiotics. I have prescribed Augmentin '875mg'$ /'125mg'$  one tablet twice daily with food, for 7 days. You may use an oral decongestant such as Mucinex D or if you have glaucoma or high blood pressure use plain Mucinex. Saline nasal spray help and can safely be used as often as needed for congestion.  If you develop worsening sinus pain, fever or notice severe headache and vision changes, or if symptoms are not better after completion of antibiotic, please schedule an appointment with a health care provider.    Sinus infections are not as easily transmitted as other respiratory infection, however we still recommend that you avoid close contact with loved ones, especially the very young and elderly.  Remember to wash your hands thoroughly throughout the day as this is the number one way to prevent the spread of infection!  I will order a short burst of Prednisone to help with cough- continue all other medications including Augmentin that will start today.   Home Care: Only take medications as instructed by your medical team. Complete the entire course of an antibiotic. Do not take these medications with alcohol. A steam or ultrasonic humidifier can help congestion.  You can place a towel over your head and breathe in the steam from hot water coming from a faucet. Avoid close contacts especially the very young and the elderly. Cover your mouth when you cough or sneeze. Always remember to wash your hands.  Get Help Right Away If: You develop worsening fever or sinus pain. You develop a severe head ache or  visual changes. Your symptoms persist after you have completed your treatment plan.  Make sure you Understand these instructions. Will watch your condition. Will get help right away if you are not doing well or get worse.  Thank you for choosing an e-visit.  Your e-visit answers were reviewed by a board certified advanced clinical practitioner to complete your personal care plan. Depending upon the condition, your plan could have included both over the counter or prescription medications.  Please review your pharmacy choice. Make sure the pharmacy is open so you can pick up prescription now. If there is a problem, you may contact your provider through [employer] and have the prescription routed to another pharmacy.  Your safety is important to [place]. If you have drug allergies check your prescription carefully.   For the next 24 hours you can use MyChart to ask questions about today's visit, request a non-urgent call back, or ask for a work or school excuse. You will get an email in the next two days asking about your experience. I hope that your e-visit has been valuable and will speed your recovery.  I provided 5 minutes of non face-to-face time during this encounter for chart review, medication and order placement, as well as and documentation.

## 2022-07-24 ENCOUNTER — Telehealth: Payer: BC Managed Care – PPO | Admitting: Physician Assistant

## 2022-07-24 DIAGNOSIS — J069 Acute upper respiratory infection, unspecified: Secondary | ICD-10-CM

## 2022-07-24 NOTE — Progress Notes (Signed)
   Thank you for the details you included in the comment boxes. Those details are very helpful in determining the best course of treatment for you and help [place] to provide the best care.Because of ongoing symptoms despite treatment via e-visit but that you are hard pressed to leave your mother currently (understandably so), we recommend that you convert this visit to a video visit in order for the provider to better assess what is going on.  The provider will be able to give you a more accurate diagnosis and treatment plan if we can more freely discuss your symptoms and with the addition of a virtual examination.   If you convert to a video visit, we will bill your insurance (similar to an office visit) and you will not be charged for this e-Visit. You will be able to stay at home and speak with the first available [facility] advanced practice provider. The link to do a video visit is in the drop down Menu tab of your Welcome screen in [place].

## 2022-09-25 ENCOUNTER — Ambulatory Visit: Payer: BC Managed Care – PPO | Admitting: Family Medicine

## 2022-10-02 ENCOUNTER — Other Ambulatory Visit: Payer: Self-pay

## 2022-10-02 MED ORDER — PREDNISONE 10 MG PO TABS
ORAL_TABLET | ORAL | 0 refills | Status: DC
  Filled 2022-10-02: qty 21, 6d supply, fill #0

## 2022-10-02 MED ORDER — PATADAY 0.7 % OP SOLN: 1.0000 [drp] | Freq: Every day | OPHTHALMIC | 0 refills | Status: DC | PRN

## 2022-10-02 MED ORDER — AZELASTINE HCL 0.1 % NA SOLN
2.0000 | Freq: Two times a day (BID) | NASAL | 11 refills | Status: AC
  Filled 2022-10-02: qty 30, 50d supply, fill #0
  Filled 2023-09-08: qty 30, 50d supply, fill #1

## 2022-10-02 MED ORDER — CEFDINIR 300 MG PO CAPS
300.0000 mg | ORAL_CAPSULE | Freq: Two times a day (BID) | ORAL | 0 refills | Status: DC
  Filled 2022-10-02: qty 20, 10d supply, fill #0

## 2022-10-02 MED ORDER — TRIAMCINOLONE ACETONIDE 55 MCG/ACT NA AERO: 2.0000 | INHALATION_SPRAY | Freq: Every day | NASAL | 11 refills | Status: AC

## 2022-10-02 MED ORDER — FAMOTIDINE 20 MG PO TABS
20.0000 mg | ORAL_TABLET | Freq: Two times a day (BID) | ORAL | 0 refills | Status: DC | PRN
  Filled 2022-10-02: qty 90, 45d supply, fill #0

## 2022-10-03 ENCOUNTER — Other Ambulatory Visit: Payer: Self-pay

## 2022-10-03 MED ORDER — MUPIROCIN 2 % EX OINT
TOPICAL_OINTMENT | Freq: Two times a day (BID) | CUTANEOUS | 10 refills | Status: DC
  Filled 2022-10-03: qty 22, 30d supply, fill #0
  Filled 2023-09-08: qty 22, 30d supply, fill #1

## 2022-11-16 ENCOUNTER — Other Ambulatory Visit: Payer: Self-pay

## 2022-11-16 ENCOUNTER — Other Ambulatory Visit: Payer: Self-pay | Admitting: Family Medicine

## 2022-11-16 DIAGNOSIS — F410 Panic disorder [episodic paroxysmal anxiety] without agoraphobia: Secondary | ICD-10-CM

## 2022-11-16 DIAGNOSIS — F341 Dysthymic disorder: Secondary | ICD-10-CM

## 2022-11-16 MED FILL — Duloxetine HCl Enteric Coated Pellets Cap 30 MG (Base Eq): ORAL | 15 days supply | Qty: 30 | Fill #0 | Status: CN

## 2022-11-16 NOTE — Telephone Encounter (Signed)
PT is coming on 11-21-2022 and has about 2 wks of meds left. Will get refill at appt

## 2022-11-20 ENCOUNTER — Other Ambulatory Visit: Payer: Self-pay

## 2022-11-20 NOTE — Progress Notes (Unsigned)
Name: Jill Cook   MRN: [id number]    DOB: 08-14-1965   Date:11/21/2022       Progress Note  Subjective  Chief Complaint  Follow Up  HPI  Palpitation : feels like a pounding sensation, sometimes skips a beat. She feels nervous during the episodes. Not associated with nausea, vomiting or diaphoresis. She saw Dr. Okey Dupre years ago for atypical chest pain. She stopped taking metoprolol again and bp is high today and palpitations are back., advised to resume Toprol XK at 25 mg daily , if no improvement needs Zio patch   IBS: she feels bloated, intermittent abdominal pain, mostly constipation type She was taking Amitiza 24 mcg but is caused bowel incontinence once and she is afraid to take it, we will try lower dose of Amitiza and monitor   GERD: she was on Omeprazole but notice muscle pain, currently taking pepcid prn, given by ENT   Depression/Anxiety: she is doing better, she never increased dose of Duloxetine, taking 30 mg daily and denies side effects of medications, she takes alprazolam prn only, she would like to have 5 more ( last rx lasted one year)   HTN: she weaned self off Metoprolol, bp is at goal and no recurrence of palpitation   Patient Active Problem List   Diagnosis Date Noted   History of endometriosis 04/15/2020   History of hypertension 05/11/2019   Palpitations 05/11/2019   Bilateral carpal tunnel syndrome 01/08/2019   Polyp of colon 01/08/2019   Family history of colorectal cancer 01/08/2019   Obese 08/23/2015   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 08/22/2015   Generalized anxiety disorder 08/17/2015   Depression 08/17/2015   Gastroesophageal reflux disease with esophagitis without hemorrhage 10/14/2008   Irritable bowel syndrome 04/12/2008    Past Surgical History:  Procedure Laterality Date   ANAL FISSURE REPAIR  06/13/2000   ARTHRODESIS METATARSAL Left 01/05/2022   [facility]   BONE CYST EXCISION Left 01/05/2022   [facility]   BUNIONECTOMY Right  07/30/1989   CESAREAN SECTION  07/30/1992   COLONOSCOPY WITH PROPOFOL N/A 06/03/2019   Procedure: COLONOSCOPY WITH PROPOFOL;  Surgeon: Pasty Spillers, MD;  Location: [facility];  Service: Endoscopy;  Laterality: N/A;   CONVERSION TO TOTAL KNEE Right 08/22/2015   Procedure: partial knee CONVERSION TO RIGHT TOTAL KNEE;  Surgeon: Durene Romans, MD;  Location: [facility];  Service: Orthopedics;  Laterality: Right;   CYST REMOVAL WITH BONE GRAFT Left 01/05/2022   [facility]   D & C HYSTEROSCOPY /  NOVASURE ENDOMETRIAL ABLATION  11/15/2005   ESOPHAGOGASTRODUODENOSCOPY (EGD) WITH PROPOFOL N/A 06/03/2019   Procedure: ESOPHAGOGASTRODUODENOSCOPY (EGD) WITH PROPOFOL;  Surgeon: Pasty Spillers, MD;  Location: [facility];  Service: Endoscopy;  Laterality: N/A;   HAMMER TOE SURGERY Left 01/05/2022   KNEE ARTHROSCOPY W/ MENISCECTOMY Right    KNEE ARTHROSCOPY WITH MEDIAL MENISECTOMY Right 10/06/2013   Procedure: RIGHT KNEE ARTHROSCOPY WITH CHONDROPLASTY;  Surgeon: Eugenia Mcalpine, MD;  Location: [facility];  Service: Orthopedics;  Laterality: Right;   LAPAROSCOPY N/A 11/01/2015   Procedure: LAPAROSCOPY DIAGNOSTIC;  Surgeon: Richarda Overlie, MD;  [facility];  Service: Gynecology;  Laterality: N/A;   RIGHT KNEE PATELLOFEMORAL ARTHROPLASTY  06/10/2008   SEPTOPLASTY  07/30/1998   TONSILLECTOMY  AGE 57   TRANSTHORACIC ECHOCARDIOGRAM  06/03/2008   NORMAL /  EF 60%   TUBAL LIGATION  07/30/1994    Family History  Problem Relation Age of Onset   Hypertension Mother  Thyroid disease Mother    Atrial fibrillation Mother    Stroke Mother    Heart disease Father    Heart attack Father 63   Colon cancer Maternal Aunt        dx in her 32's   Breast cancer Paternal Aunt    Uterine cancer Paternal Aunt    Esophageal cancer Maternal Grandfather    Hypertension Paternal Grandfather    Diabetes Paternal Grandfather     Social History   Tobacco Use    Smoking status: Never   Smokeless tobacco: Never  Substance Use Topics   Alcohol use: Yes    Comment: 1 drink per month     Current Outpatient Medications:    ALPRAZolam (XANAX) 0.5 MG tablet, Take 1 tablet (0.5 mg total) by mouth daily as needed for anxiety., Disp: 5 tablet, Rfl: 0   aspirin EC 81 MG tablet, Take 81 mg by mouth daily., Disp: , Rfl:    azelastine (ASTELIN) 0.1 % nasal spray, Place 2 sprays into both nostrils every 12 (twelve) hours as needed for drainage., Disp: 30 mL, Rfl: 11   DULoxetine (CYMBALTA) 30 MG capsule, Take 1-2 capsules (30-60 mg total) by mouth daily., Disp: 30 capsule, Rfl: 0   famotidine (PEPCID) 20 MG tablet, Take 1 tablet (20 mg total) by mouth every 12 (twelve) hours as needed for reflux and heart burn., Disp: 90 tablet, Rfl: 0   metoprolol succinate (TOPROL-XL) 25 MG 24 hr tablet, Take 0.5 tablets (12.5 mg total) by mouth daily., Disp: 45 tablet, Rfl: 1   mupirocin ointment (BACTROBAN) 2 %, Place into the nose 2 (two) times daily with qtip for 10 days, then as needed for nasal sores/crusting., Disp: 22 g, Rfl: 10   Olopatadine HCl (PATADAY) 0.7 % SOLN, Place 1 drop into both eyes daily as needed for allergies., Disp: 5 mL, Rfl: 0   triamcinolone (NASACORT) 55 MCG/ACT AERO nasal inhaler, Place 2 sprays into the nose daily., Disp: 32.4 mL, Rfl: 11   Cholecalciferol (VITAMIN D) 50 MCG (2000 UT) tablet, Take 2,000 Units by mouth daily., Disp: , Rfl:   Allergies  Allergen Reactions   Codeine Nausea And Vomiting and Hives   Morphine Hives and Nausea And Vomiting   Hydrocodone Nausea Only   Hydromorphone Hcl Nausea Only    I personally reviewed active problem list, medication list, allergies, family history, social history, health maintenance with the patient/caregiver today.   ROS  Ten systems reviewed and is negative except as mentioned in HPI   Objective  Vitals:   11/21/22 1527  BP: (!) 152/84  Pulse: 100  Resp: 16  Temp: 97.6 F (36.4 C)   TempSrc: Oral  SpO2: 97%  Weight: 217 lb 14.4 oz (98.8 kg)  Height:  (1.676 m)    Body mass index is 35.17 kg/m.  Physical:    Constitutional: Patient appears well-developed and well-nourished. Obese  No distress.  HEENT: head atraumatic, normocephalic, pupils equal and reactive to light, neck supple Cardiovascular: Normal rate, regular rhythm and normal heart sounds.  No murmur heard. No BLE edema. Pulmonary/Chest: Effort normal and breath sounds normal. No respiratory distress. Abdominal: Soft.  There is no tenderness. Psychiatric: Patient has a normal mood and affect. behavior is normal. Judgment and thought content normal.   PHQ2/9:    11/21/2022    3:22 PM 03/28/2022    2:04 PM 10/31/2021    1:06 PM 05/30/2021    8:33 AM 01/17/2021    2:11 PM  Depression screen PHQ 2/9  Decreased Interest 0 0 0 0 0  Down, Depressed, Hopeless 0 0 1 0 0  PHQ - 2 Score 0 0 1 0 0  Altered sleeping 0 0 2 0 1  Tired, decreased energy 0 0 3 0 1  Change in appetite 0 0 0 0 0  Feeling bad or failure about yourself  0 0 0 0 0  Trouble concentrating 0 0 0 0 0  Moving slowly or fidgety/restless 0 0 0 0 0  Suicidal thoughts 0 0 0 0 0  PHQ-9 Score 0 0 6 0 2  Difficult doing work/chores Not difficult at all Not difficult at all  Not difficult at all     phq 9 is negative   Fall Risk:    11/21/2022    3:22 PM 03/28/2022    2:03 PM 10/31/2021    1:06 PM 05/30/2021    8:33 AM 01/17/2021    2:11 PM  Fall Risk   Falls in the past year? 0 0 0 0 1  Number falls in past yr: 0 0 0 0 1  Injury with Fall? 0 0 0 0 0  Risk for fall due to : No Fall Risks No Fall Risks No Fall Risks    Follow up Falls prevention discussed;Education provided;Falls evaluation completed Falls prevention discussed;Education provided Falls prevention discussed        Functional Status Survey: Is the patient deaf or have difficulty hearing?: No Does the patient have difficulty seeing, even when wearing glasses/contacts?:  No Does the patient have difficulty concentrating, remembering, or making decisions?: No Does the patient have difficulty walking or climbing stairs?: No Does the patient have difficulty dressing or bathing?: No Does the patient have difficulty doing errands alone such as visiting a doctor's office or shopping?: No    Assessment & Plan  1. Irritable bowel syndrome with constipation  - lubiprostone (AMITIZA) 8 MCG capsule; Take 1 capsule (8 mcg total) by mouth 2 (two) times daily with a meal.  Dispense: 60 capsule; Refill: 0  2. Gastroesophageal reflux disease with esophagitis without hemorrhage  Taking pepcid  3. Essential hypertension  - metoprolol succinate (TOPROL-XL) 25 MG 24 hr tablet; Take 0.5 tablets (12.5 mg total) by mouth daily.  Dispense: 90 tablet; Refill: 0 - CBC with Differential/Platelet - COMPLETE METABOLIC PANEL WITH GFR  4. Dyslipidemia  - Lipid panel  5. Dysthymia  - DULoxetine (CYMBALTA) 30 MG capsule; Take 1-2 capsules (30-60 mg total) by mouth daily.  Dispense: 90 capsule; Refill: 0 - CBC with Differential/Platelet  6. Panic attack  - DULoxetine (CYMBALTA) 30 MG capsule; Take 1-2 capsules (30-60 mg total) by mouth daily.  Dispense: 90 capsule; Refill: 0 - ALPRAZolam (XANAX) 0.5 MG tablet; Take 1 tablet (0.5 mg total) by mouth daily as needed for anxiety.  Dispense: 5 tablet; Refill: 0  7. Palpitations  - TSH  8. Vitamin D deficiency  - VITAMIN D 25 Hydroxy (Vit-D Deficiency, Fractures)  9. Diabetes mellitus screening  - Hemoglobin A1c - TSH

## 2022-11-21 ENCOUNTER — Other Ambulatory Visit: Payer: Self-pay

## 2022-11-21 ENCOUNTER — Ambulatory Visit: Payer: BC Managed Care – PPO | Admitting: Family Medicine

## 2022-11-21 VITALS — BP 152/84 | HR 100 | Temp 97.6°F | Resp 16 | Ht 66.0 in | Wt 217.9 lb

## 2022-11-21 DIAGNOSIS — E785 Hyperlipidemia, unspecified: Secondary | ICD-10-CM | POA: Diagnosis not present

## 2022-11-21 DIAGNOSIS — K2100 Gastro-esophageal reflux disease with esophagitis, without bleeding: Secondary | ICD-10-CM | POA: Diagnosis not present

## 2022-11-21 DIAGNOSIS — K581 Irritable bowel syndrome with constipation: Secondary | ICD-10-CM

## 2022-11-21 DIAGNOSIS — F410 Panic disorder [episodic paroxysmal anxiety] without agoraphobia: Secondary | ICD-10-CM

## 2022-11-21 DIAGNOSIS — F341 Dysthymic disorder: Secondary | ICD-10-CM

## 2022-11-21 DIAGNOSIS — E559 Vitamin D deficiency, unspecified: Secondary | ICD-10-CM

## 2022-11-21 DIAGNOSIS — R002 Palpitations: Secondary | ICD-10-CM

## 2022-11-21 DIAGNOSIS — I10 Essential (primary) hypertension: Secondary | ICD-10-CM

## 2022-11-21 DIAGNOSIS — Z131 Encounter for screening for diabetes mellitus: Secondary | ICD-10-CM

## 2022-11-21 LAB — CBC WITH DIFFERENTIAL/PLATELET
Absolute Monocytes: 267 cells/uL (ref 200–950)
Monocytes Relative: 5.8 %

## 2022-11-21 MED ORDER — METOPROLOL SUCCINATE ER 25 MG PO TB24
12.5000 mg | ORAL_TABLET | Freq: Every day | ORAL | 0 refills | Status: DC
Start: 2022-11-21 — End: 2023-03-13
  Filled 2022-11-21: qty 45, 90d supply, fill #0

## 2022-11-21 MED ORDER — LUBIPROSTONE 8 MCG PO CAPS
8.0000 ug | ORAL_CAPSULE | Freq: Two times a day (BID) | ORAL | 0 refills | Status: DC
Start: 2022-11-21 — End: 2023-03-13
  Filled 2022-11-21: qty 60, 30d supply, fill #0

## 2022-11-21 MED ORDER — ALPRAZOLAM 0.5 MG PO TABS
0.5000 mg | ORAL_TABLET | Freq: Every day | ORAL | 0 refills | Status: DC | PRN
  Filled 2022-11-21: qty 5, 5d supply, fill #0

## 2022-11-21 MED ORDER — DULOXETINE HCL 30 MG PO CPEP
30.0000 mg | ORAL_CAPSULE | Freq: Every day | ORAL | 0 refills | Status: DC
Start: 2022-11-21 — End: 2023-03-13
  Filled 2022-11-22: qty 90, 45d supply, fill #0

## 2022-11-22 ENCOUNTER — Other Ambulatory Visit: Payer: Self-pay

## 2022-11-22 LAB — LIPID PANEL
Cholesterol: 158 mg/dL (ref <200)
HDL: 35 mg/dL — ABNORMAL LOW (ref >=50)
LDL Cholesterol (Calc): 91 mg/dL (calc)
Non-HDL Cholesterol (Calc): 123 mg/dL (calc) (ref <130)
Total CHOL/HDL Ratio: 4.5 (calc) (ref <5.0)
Triglycerides: 219 mg/dL — ABNORMAL HIGH (ref <150)

## 2022-11-22 LAB — COMPLETE METABOLIC PANEL WITH GFR
AG Ratio: 2 (calc) (ref 1.0–2.5)
ALT: 12 U/L (ref 6–29)
AST: 15 U/L (ref 10–35)
Albumin: 4.1 g/dL (ref 3.6–5.1)
Alkaline phosphatase (APISO): 62 U/L (ref 37–153)
BUN: 15 mg/dL (ref 7–25)
CO2: 26 mmol/L (ref 20–32)
Calcium: 9.5 mg/dL (ref 8.6–10.4)
Chloride: 105 mmol/L (ref 98–110)
Creat: 0.97 mg/dL (ref 0.50–1.03)
Globulin: 2.1 g/dL (calc) (ref 1.9–3.7)
Glucose, Bld: 144 mg/dL — ABNORMAL HIGH (ref 65–99)
Potassium: 3.8 mmol/L (ref 3.5–5.3)
Sodium: 141 mmol/L (ref 135–146)
Total Bilirubin: 0.4 mg/dL (ref 0.2–1.2)
Total Protein: 6.2 g/dL (ref 6.1–8.1)
eGFR: 69 mL/min/{1.73_m2} (ref >=60)

## 2022-11-22 LAB — HEMOGLOBIN A1C
Hgb A1c MFr Bld: 5.6 % of total Hgb (ref <5.7)
Mean Plasma Glucose: 114 mg/dL
eAG (mmol/L): 6.3 mmol/L

## 2022-11-22 LAB — CBC WITH DIFFERENTIAL/PLATELET
Basophils Absolute: 60 cells/uL (ref 0–200)
Basophils Relative: 1.3 %
Eosinophils Absolute: 120 cells/uL (ref 15–500)
Eosinophils Relative: 2.6 %
HCT: 34.1 % — ABNORMAL LOW (ref 35.0–45.0)
Hemoglobin: 11.8 g/dL (ref 11.7–15.5)
Lymphs Abs: 1435 cells/uL (ref 850–3900)
MCH: 29.2 pg (ref 27.0–33.0)
MCHC: 34.6 g/dL (ref 32.0–36.0)
MCV: 84.4 fL (ref 80.0–100.0)
MPV: 10.6 fL (ref 7.5–12.5)
Neutro Abs: 2719 cells/uL (ref 1500–7800)
Neutrophils Relative %: 59.1 %
Platelets: 161 10*3/uL (ref 140–400)
RBC: 4.04 10*6/uL (ref 3.80–5.10)
RDW: 13.6 % (ref 11.0–15.0)
Total Lymphocyte: 31.2 %
WBC: 4.6 10*3/uL (ref 3.8–10.8)

## 2022-11-22 LAB — TSH: TSH: 1.33 mIU/L (ref 0.40–4.50)

## 2022-11-22 LAB — VITAMIN D 25 HYDROXY (VIT D DEFICIENCY, FRACTURES): Vit D, 25-Hydroxy: 28 ng/mL — ABNORMAL LOW (ref 30–100)

## 2023-01-14 ENCOUNTER — Other Ambulatory Visit: Payer: Self-pay

## 2023-01-15 ENCOUNTER — Encounter: Payer: Self-pay | Admitting: Family Medicine

## 2023-02-20 ENCOUNTER — Ambulatory Visit: Payer: BC Managed Care – PPO | Admitting: Family Medicine

## 2023-03-13 ENCOUNTER — Other Ambulatory Visit: Payer: Self-pay

## 2023-03-13 ENCOUNTER — Encounter: Payer: Self-pay | Admitting: Family Medicine

## 2023-03-13 ENCOUNTER — Ambulatory Visit: Payer: BC Managed Care – PPO | Admitting: Family Medicine

## 2023-03-13 VITALS — BP 130/72 | HR 96 | Resp 16 | Ht 66.0 in | Wt 214.0 lb

## 2023-03-13 DIAGNOSIS — F341 Dysthymic disorder: Secondary | ICD-10-CM

## 2023-03-13 DIAGNOSIS — E559 Vitamin D deficiency, unspecified: Secondary | ICD-10-CM | POA: Diagnosis not present

## 2023-03-13 DIAGNOSIS — E785 Hyperlipidemia, unspecified: Secondary | ICD-10-CM

## 2023-03-13 DIAGNOSIS — K2100 Gastro-esophageal reflux disease with esophagitis, without bleeding: Secondary | ICD-10-CM

## 2023-03-13 DIAGNOSIS — I10 Essential (primary) hypertension: Secondary | ICD-10-CM

## 2023-03-13 DIAGNOSIS — Z8249 Family history of ischemic heart disease and other diseases of the circulatory system: Secondary | ICD-10-CM

## 2023-03-13 DIAGNOSIS — F410 Panic disorder [episodic paroxysmal anxiety] without agoraphobia: Secondary | ICD-10-CM

## 2023-03-13 DIAGNOSIS — R5383 Other fatigue: Secondary | ICD-10-CM

## 2023-03-13 DIAGNOSIS — K581 Irritable bowel syndrome with constipation: Secondary | ICD-10-CM

## 2023-03-13 MED ORDER — METOPROLOL SUCCINATE ER 25 MG PO TB24
25.0000 mg | ORAL_TABLET | Freq: Every day | ORAL | 2 refills | Status: DC
Start: 2023-03-13 — End: 2023-09-27
  Filled 2023-03-13: qty 90, 90d supply, fill #0
  Filled 2023-06-17: qty 90, 90d supply, fill #1
  Filled 2023-09-08: qty 90, 90d supply, fill #2

## 2023-03-13 MED ORDER — DULOXETINE HCL 30 MG PO CPEP
30.0000 mg | ORAL_CAPSULE | Freq: Every day | ORAL | 2 refills | Status: DC
Start: 2023-03-13 — End: 2023-08-30
  Filled 2023-03-13: qty 90, 90d supply, fill #0
  Filled 2023-06-17: qty 90, 90d supply, fill #1

## 2023-03-13 NOTE — Progress Notes (Addendum)
Name: Jill Cook   MRN: [id number]    DOB: 17-Oct-1965   Date:03/13/2023       Progress Note  Subjective  Chief Complaint  Follow Up  HPI  Palpitation : feels like a pounding sensation, sometimes skips a beat. She feels nervous during the episodes. Not associated with nausea, vomiting or diaphoresis. She saw Dr. Okey Dupre years ago for atypical chest pain. She stopped taking metoprolol before last visit and bp was up, she is back on metoprolol 25 mg dialy and palpitation and bp have improved   IBS: she feels bloated, intermittent abdominal pain, mostly constipation type Amitiza is not working, she is trying a FODMAP diet with Dr. Manson Passey - functional dietician and is getting better   GERD: she was on Omeprazole but notice muscle pain, currently taking pepcid prn, given by ENT , she states feeling better   Depression/Anxiety: she is doing better, she never increased dose of Duloxetine, taking 30 mg daily and denies side effects of medications, she takes alprazolam prn only, has not taken any since last visit . She is feeling well and needs a refill   HTN: BP is at goal today, taking Metoprolol and denies side effects, no chest pain or SOB  Family history of heart disease and stroke: she would like to have some other labs done   Fatigue: feels tired and we will check B12 and folate today, other labs done in April normal and reviewed with patient   Patient Active Problem List   Diagnosis Date Noted   History of endometriosis 04/15/2020   History of hypertension 05/11/2019   Palpitations 05/11/2019   Bilateral carpal tunnel syndrome 01/08/2019   Polyp of colon 01/08/2019   Family history of colorectal cancer 01/08/2019   Obese 08/23/2015   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 08/22/2015   Generalized anxiety disorder 08/17/2015   Depression 08/17/2015   Gastroesophageal reflux disease with esophagitis without hemorrhage 10/14/2008   Irritable bowel syndrome 04/12/2008    Past  Surgical History:  Procedure Laterality Date   ANAL FISSURE REPAIR  06/13/2000   ARTHRODESIS METATARSAL Left 01/05/2022   [facility]   BONE CYST EXCISION Left 01/05/2022   [facility]   BUNIONECTOMY Right 07/30/1989   CESAREAN SECTION  07/30/1992   COLONOSCOPY WITH PROPOFOL N/A 06/03/2019   Procedure: COLONOSCOPY WITH PROPOFOL;  Surgeon: Pasty Spillers, MD;  Location: [facility];  Service: Endoscopy;  Laterality: N/A;   CONVERSION TO TOTAL KNEE Right 08/22/2015   Procedure: partial knee CONVERSION TO RIGHT TOTAL KNEE;  Surgeon: Durene Romans, MD;  Location: [facility];  Service: Orthopedics;  Laterality: Right;   CYST REMOVAL WITH BONE GRAFT Left 01/05/2022   [facility]   D & C HYSTEROSCOPY /  NOVASURE ENDOMETRIAL ABLATION  11/15/2005   ESOPHAGOGASTRODUODENOSCOPY (EGD) WITH PROPOFOL N/A 06/03/2019   Procedure: ESOPHAGOGASTRODUODENOSCOPY (EGD) WITH PROPOFOL;  Surgeon: Pasty Spillers, MD;  Location: [facility];  Service: Endoscopy;  Laterality: N/A;   HAMMER TOE SURGERY Left 01/05/2022   KNEE ARTHROSCOPY W/ MENISCECTOMY Right    KNEE ARTHROSCOPY WITH MEDIAL MENISECTOMY Right 10/06/2013   Procedure: RIGHT KNEE ARTHROSCOPY WITH CHONDROPLASTY;  Surgeon: Eugenia Mcalpine, MD;  Location: [facility];  Service: Orthopedics;  Laterality: Right;   LAPAROSCOPY N/A 11/01/2015   Procedure: LAPAROSCOPY DIAGNOSTIC;  Surgeon: Richarda Overlie, MD;  Location: [facility];  Service: Gynecology;  Laterality: N/A;   RIGHT KNEE PATELLOFEMORAL ARTHROPLASTY  06/10/2008   SEPTOPLASTY  07/30/1998   TONSILLECTOMY  AGE  12   TRANSTHORACIC ECHOCARDIOGRAM  06/03/2008   NORMAL /  EF 60%   TUBAL LIGATION  07/30/1994    Family History  Problem Relation Age of Onset   Hypertension Mother    Thyroid disease Mother    Atrial fibrillation Mother    Stroke Mother    Heart disease Father    Heart attack Father 46   Colon cancer Maternal Aunt        dx in her 74's   Breast cancer  Paternal Aunt    Uterine cancer Paternal Aunt    Esophageal cancer Maternal Grandfather    Hypertension Paternal Grandfather    Diabetes Paternal Grandfather     Social History   Tobacco Use   Smoking status: Never   Smokeless tobacco: Never  Substance Use Topics   Alcohol use: Yes    Comment: 1 drink per month     Current Outpatient Medications:    ALPRAZolam (XANAX) 0.5 MG tablet, Take 1 tablet (0.5 mg total) by mouth daily as needed for anxiety., Disp: 5 tablet, Rfl: 0   aspirin EC 81 MG tablet, Take 81 mg by mouth daily., Disp: , Rfl:    azelastine (ASTELIN) 0.1 % nasal spray, Place 2 sprays into both nostrils every 12 (twelve) hours as needed for drainage., Disp: 30 mL, Rfl: 11   Cholecalciferol (VITAMIN D) 50 MCG (2000 UT) tablet, Take 2,000 Units by mouth daily., Disp: , Rfl:    DULoxetine (CYMBALTA) 30 MG capsule, Take 1-2 capsules (30-60 mg total) by mouth daily., Disp: 90 capsule, Rfl: 0   famotidine (PEPCID) 20 MG tablet, Take 1 tablet (20 mg total) by mouth every 12 (twelve) hours as needed for reflux and heart burn., Disp: 90 tablet, Rfl: 0   metoprolol succinate (TOPROL-XL) 25 MG 24 hr tablet, Take 0.5 tablets (12.5 mg total) by mouth daily., Disp: 90 tablet, Rfl: 0   mupirocin ointment (BACTROBAN) 2 %, Place into the nose 2 (two) times daily with qtip for 10 days, then as needed for nasal sores/crusting., Disp: 22 g, Rfl: 10   Olopatadine HCl (PATADAY) 0.7 % SOLN, Place 1 drop into both eyes daily as needed for allergies., Disp: 5 mL, Rfl: 0   triamcinolone (NASACORT) 55 MCG/ACT AERO nasal inhaler, Place 2 sprays into the nose daily., Disp: 32.4 mL, Rfl: 11  Allergies  Allergen Reactions   Codeine Nausea And Vomiting and Hives   Morphine Hives and Nausea And Vomiting   Hydrocodone Nausea Only   Hydromorphone Hcl Nausea Only    I personally reviewed active problem list, medication list, allergies, family history, social history, health maintenance with the  patient/caregiver today.   ROS  Constitutional: Negative for fever or weight change.  Respiratory: Negative for cough and shortness of breath.   Cardiovascular: Negative for chest pain or palpitations.  Gastrointestinal: Negative for abdominal pain, no bowel changes.  Musculoskeletal: Negative for gait problem or joint swelling.  Skin: Negative for rash.  Neurological: Negative for dizziness or headache.  No other specific complaints in a complete review of systems (except as listed in HPI above).   Objective  Vitals:   03/13/23 1005  BP: 130/72  Pulse: 96  Resp: 16  SpO2: 99%  Weight: 214 lb (97.1 kg)  Height: 5\' 6"  (1.676 m)    Body mass index is 34.54 kg/m.  Physical Exam  Constitutional: Patient appears well-developed and well-nourished. Obese  No distress.  HEENT: head atraumatic, normocephalic, pupils equal and reactive to light, neck  supple Cardiovascular: Normal rate, regular rhythm and normal heart sounds.  No murmur heard. No BLE edema. Pulmonary/Chest: Effort normal and breath sounds normal. No respiratory distress. Abdominal: Soft.  There is no tenderness. Psychiatric: Patient has a normal mood and affect. behavior is normal. Judgment and thought content normal.   PHQ2/9:    03/13/2023   10:05 AM 11/21/2022    3:22 PM 03/28/2022    2:04 PM 10/31/2021    1:06 PM 05/30/2021    8:33 AM  Depression screen PHQ 2/9  Decreased Interest 0 0 0 0 0  Down, Depressed, Hopeless 0 0 0 1 0  PHQ - 2 Score 0 0 0 1 0  Altered sleeping 0 0 0 2 0  Tired, decreased energy 2 0 0 3 0  Change in appetite 0 0 0 0 0  Feeling bad or failure about yourself  0 0 0 0 0  Trouble concentrating 0 0 0 0 0  Moving slowly or fidgety/restless 0 0 0 0 0  Suicidal thoughts 0 0 0 0 0  PHQ-9 Score 2 0 0 6 0  Difficult doing work/chores  Not difficult at all Not difficult at all  Not difficult at all    phq 9 is negative     03/13/2023   10:25 AM 11/21/2022    3:27 PM 03/28/2022    2:11 PM  10/31/2021    1:11 PM  GAD 7 : Generalized Anxiety Score  Nervous, Anxious, on Edge 1 1 0 2  Control/stop worrying 1 1 0 2  Worry too much - different things 0 0 0 2  Trouble relaxing 0 0 0 3  Restless 0 0 0 0  Easily annoyed or irritable 0 0 0 0  Afraid - awful might happen 0 0 0 1  Total GAD 7 Score 2 2 0 10  Anxiety Difficulty Not difficult at all Not difficult at all Not difficult at all     She feels better    Fall Risk:    03/13/2023   10:05 AM 11/21/2022    3:22 PM 03/28/2022    2:03 PM 10/31/2021    1:06 PM 05/30/2021    8:33 AM  Fall Risk   Falls in the past year? 0 0 0 0 0  Number falls in past yr: 0 0 0 0 0  Injury with Fall? 0 0 0 0 0  Risk for fall due to : No Fall Risks No Fall Risks No Fall Risks No Fall Risks   Follow up Falls prevention discussed Falls prevention discussed;Education provided;Falls evaluation completed Falls prevention discussed;Education provided Falls prevention discussed       Functional Status Survey: Is the patient deaf or have difficulty hearing?: No Does the patient have difficulty seeing, even when wearing glasses/contacts?: No Does the patient have difficulty concentrating, remembering, or making decisions?: No Does the patient have difficulty walking or climbing stairs?: No Does the patient have difficulty dressing or bathing?: No Does the patient have difficulty doing errands alone such as visiting a doctor's office or shopping?: No    Assessment & Plan  1. Dyslipidemia  - Cardio IQ (R) Advanced Lipid Panel  2. Essential hypertension  - metoprolol succinate (TOPROL-XL) 25 MG 24 hr tablet; Take 1 tablet (25 mg total) by mouth daily.  Dispense: 90 tablet; Refill: 2  3. Gastroesophageal reflux disease with esophagitis without hemorrhage  Controlled   4. Vitamin D deficiency  Taking Vitamin D supplementation   5. Dysthymia  -  DULoxetine (CYMBALTA) 30 MG capsule; Take 1 capsule (30 mg total) by mouth daily.  Dispense: 90  capsule; Refill: 2  6. Irritable bowel syndrome with constipation  Changing diet   7. Family history of heart disease  - Cardio IQ (R) Advanced Lipid Panel  8. Other fatigue  - B12 and Folate Panel  9.Panic attack  - DULoxetine (CYMBALTA) 30 MG capsule; Take 1 capsule (30 mg total) by mouth daily.  Dispense: 90 capsule; Refill: 2

## 2023-03-15 ENCOUNTER — Other Ambulatory Visit: Payer: Self-pay

## 2023-03-25 LAB — CARDIO IQ(R) ADVANCED LIPID PANEL
Apolipoprotein B: 99 mg/dL — ABNORMAL HIGH (ref <90)
Cholesterol: 179 mg/dL (ref <200)
HDL LARGE: 5466 nmol/L — ABNORMAL LOW (ref >6729)
HDL: 45 mg/dL — ABNORMAL LOW (ref >49)
LDL Cholesterol (Calc): 110 mg/dL — ABNORMAL HIGH (ref <100)
LDL Medium: 467 nmol/L — ABNORMAL HIGH (ref <215)
LDL Particle Number: 2075 nmol/L — ABNORMAL HIGH (ref <1138)
LDL Peak Size: 213.9 Angstrom — ABNORMAL LOW (ref >222.9)
LDL Small: 508 nmol/L — ABNORMAL HIGH (ref <142)
Lipoprotein (a): 14 nmol/L (ref <75)
Non-HDL Cholesterol (Calc): 134 mg/dL — ABNORMAL HIGH (ref <130)
Total CHOL/HDL Ratio: 4 calc (ref <5.0)
Triglycerides: 129 mg/dL (ref <150)

## 2023-03-25 LAB — B12 AND FOLATE PANEL
Folate: 9.7 ng/mL
Vitamin B-12: 404 pg/mL (ref 200–1100)

## 2023-05-20 ENCOUNTER — Ambulatory Visit: Payer: BC Managed Care – PPO

## 2023-05-20 ENCOUNTER — Other Ambulatory Visit: Payer: Self-pay

## 2023-05-20 ENCOUNTER — Ambulatory Visit
Admission: RE | Admit: 2023-05-20 | Discharge: 2023-05-20 | Disposition: A | Discharge status: Home / Self Care | Payer: BC Managed Care – PPO | Source: Ambulatory Visit | Attending: Nurse Practitioner | Admitting: Nurse Practitioner

## 2023-05-20 VITALS — BP 148/83 | HR 90 | Temp 99.2°F | Resp 14

## 2023-05-20 DIAGNOSIS — R062 Wheezing: Secondary | ICD-10-CM | POA: Diagnosis not present

## 2023-05-20 DIAGNOSIS — J069 Acute upper respiratory infection, unspecified: Secondary | ICD-10-CM

## 2023-05-20 MED ORDER — METHYLPREDNISOLONE ACETATE 40 MG/ML IJ SUSP
40.0000 mg | Freq: Once | INTRAMUSCULAR | Status: AC
  Administered 2023-05-20: 40 mg via INTRAMUSCULAR

## 2023-05-20 MED ORDER — BENZONATATE 100 MG PO CAPS
100.0000 mg | ORAL_CAPSULE | Freq: Three times a day (TID) | ORAL | 0 refills | Status: DC | PRN
  Filled 2023-05-20: qty 21, 7d supply, fill #0

## 2023-05-20 MED ORDER — PREDNISONE 20 MG PO TABS
40.0000 mg | ORAL_TABLET | Freq: Every day | ORAL | 0 refills | Status: AC
  Filled 2023-05-20: qty 10, 5d supply, fill #0

## 2023-05-20 MED ORDER — IPRATROPIUM-ALBUTEROL 0.5-2.5 (3) MG/3ML IN SOLN
3.0000 mL | Freq: Once | RESPIRATORY_TRACT | Status: AC
  Administered 2023-05-20: 3 mL via RESPIRATORY_TRACT

## 2023-05-20 MED ORDER — ALBUTEROL SULFATE HFA 108 (90 BASE) MCG/ACT IN AERS
1.0000 | INHALATION_SPRAY | Freq: Four times a day (QID) | RESPIRATORY_TRACT | 0 refills | Status: DC | PRN
  Filled 2023-05-20: qty 6.7, 25d supply, fill #0

## 2023-05-20 NOTE — ED Provider Notes (Signed)
[facility]    CSN: [id number] Arrival date & time: 05/20/23  0850      History   Chief Complaint Chief Complaint  Patient presents with   Cough    I've been sick while out of town over the weekend with fever, chills, body aches, persistent productive cough, and lack of sleep due to coughing - Entered by patient    HPI Jill Cook is a 57 y.o. female.   Patient presents today with 4-day history of fever, Tmax 102 F.  Also endorses congested cough, shortness of breath with coughing, wheezing, and rib cage/abdominal pain from coughing so much.  She also endorses intermittent sore throat and headache and fatigue.  Patient denies chest pain or tightness at rest, runny nose, postnasal drainage, ear pain, abdominal pain, nausea/vomiting, and diarrhea.  No change in appetite or loss of taste/smell.  COVID-19 testing at home yesterday was negative.  Has been taking Mucinex 1200 mg twice daily, azelastine nasal spray, Tylenol for the headache, and Delsym for the cough with minimal improvement.    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 08/22/2015   Generalized anxiety disorder 08/17/2015   Depression 08/17/2015   Gastroesophageal reflux disease with esophagitis without hemorrhage 10/14/2008   Irritable bowel syndrome 04/12/2008    Past Surgical History:  Procedure Laterality Date    ANAL FISSURE REPAIR  06/13/2000   ARTHRODESIS METATARSAL Left 01/05/2022   [facility]   BONE CYST EXCISION Left 01/05/2022   [facility]   BUNIONECTOMY Right 07/30/1989   CESAREAN SECTION  07/30/1992   COLONOSCOPY WITH PROPOFOL N/A 06/03/2019   Procedure: COLONOSCOPY WITH PROPOFOL;  Surgeon: Pasty Spillers, MD;  Location: [facility];  Service: Endoscopy;  Laterality: N/A;   CONVERSION TO TOTAL KNEE Right 08/22/2015   Procedure: partial knee CONVERSION TO RIGHT TOTAL KNEE;  Surgeon: Durene Romans, MD;  Location: [facility];  Service: Orthopedics;  Laterality: Right;   CYST REMOVAL WITH BONE GRAFT Left 01/05/2022   [facility]   D & C HYSTEROSCOPY /  NOVASURE ENDOMETRIAL ABLATION  11/15/2005   ESOPHAGOGASTRODUODENOSCOPY (EGD) WITH PROPOFOL N/A 06/03/2019   Procedure: ESOPHAGOGASTRODUODENOSCOPY (EGD) WITH PROPOFOL;  Surgeon: Pasty Spillers, MD;  Location: [facility];  Service: Endoscopy;  Laterality: N/A;   HAMMER TOE SURGERY Left 01/05/2022   KNEE ARTHROSCOPY W/ MENISCECTOMY Right    KNEE ARTHROSCOPY WITH MEDIAL MENISECTOMY Right 10/06/2013   Procedure: RIGHT KNEE ARTHROSCOPY WITH CHONDROPLASTY;  Surgeon: Eugenia Mcalpine, MD;  Location: [facility];  Service: Orthopedics;  Laterality: Right;   LAPAROSCOPY N/A 11/01/2015   Procedure: LAPAROSCOPY DIAGNOSTIC;  Surgeon: Richarda Overlie, MD;  Location: [facility];  Service: Gynecology;  Laterality: N/A;   RIGHT KNEE PATELLOFEMORAL ARTHROPLASTY  06/10/2008   SEPTOPLASTY  07/30/1998  TONSILLECTOMY  AGE 5   TRANSTHORACIC ECHOCARDIOGRAM  06/03/2008   NORMAL /  EF 60%   TUBAL LIGATION  07/30/1994    OB History   No obstetric history on file.      Home Medications    Prior to Admission medications   Medication Sig Start Date End Date Taking? Authorizing Provider  albuterol (VENTOLIN HFA) 108 (90 Base) MCG/ACT inhaler Inhale 1-2 puffs into the lungs every 6 (six) hours as needed for wheezing or  shortness of breath. 05/20/23  Yes Valentino Nose, NP  benzonatate (TESSALON) 100 MG capsule Take 1 capsule (100 mg total) by mouth 3 (three) times daily as needed for cough. Do not take with alcohol or while driving or operating heavy machinery.  May cause drowsiness. 05/20/23  Yes Valentino Nose, NP  predniSONE (DELTASONE) 20 MG tablet Take 2 tablets (40 mg total) by mouth daily with breakfast for 5 days. 05/20/23 05/25/23 Yes Valentino Nose, NP  ALPRAZolam Prudy Feeler) 0.5 MG tablet Take 1 tablet (0.5 mg total) by mouth daily as needed for anxiety. 11/21/22   Alba Cory, MD  aspirin EC 81 MG tablet Take 81 mg by mouth daily.    [provider]  azelastine (ASTELIN) 0.1 % nasal spray Place 2 sprays into both nostrils every 12 (twelve) hours as needed for drainage. 10/02/22     Cholecalciferol (VITAMIN D) 50 MCG (2000 UT) tablet Take 2,000 Units by mouth daily.    [provider]  DULoxetine (CYMBALTA) 30 MG capsule Take 1 capsule (30 mg total) by mouth daily. 03/13/23   Alba Cory, MD  famotidine (PEPCID) 20 MG tablet Take 1 tablet (20 mg total) by mouth every 12 (twelve) hours as needed for reflux and heart burn. 10/02/22     metoprolol succinate (TOPROL-XL) 25 MG 24 hr tablet Take 1 tablet (25 mg total) by mouth daily. 03/13/23   Alba Cory, MD  mupirocin ointment (BACTROBAN) 2 % Place into the nose 2 (two) times daily with qtip for 10 days, then as needed for nasal sores/crusting. 10/03/22     Olopatadine HCl (PATADAY) 0.7 % SOLN Place 1 drop into both eyes daily as needed for allergies. 10/02/22     triamcinolone (NASACORT) 55 MCG/ACT AERO nasal inhaler Place 2 sprays into the nose daily. 10/02/22   Bud Face, MD    Family History Family History  Problem Relation Age of Onset   Hypertension Mother    Thyroid disease Mother    Atrial fibrillation Mother    Stroke Mother    Heart disease Father    Heart attack Father 11   Colon cancer Maternal Aunt         dx in her 88's   Breast cancer Paternal Aunt    Uterine cancer Paternal Aunt    Esophageal cancer Maternal Grandfather    Hypertension Paternal Grandfather    Diabetes Paternal Grandfather     Social History Social History   Tobacco Use   Smoking status: Never   Smokeless tobacco: Never  Vaping Use   Vaping status: Never Used  Substance Use Topics   Alcohol use: Yes    Comment: 1 drink per month   Drug use: No     Allergies   Codeine, Morphine, Hydrocodone, and Hydromorphone hcl   Review of Systems Review of Systems Per HPI  Physical Exam Triage Vital Signs ED Triage Vitals  Encounter Vitals Group     BP 05/20/23 0903 (!) 148/83  Systolic BP Percentile --      Diastolic BP Percentile --      Pulse Rate 05/20/23 0903 94     Resp 05/20/23 0903 14     Temp 05/20/23 0903 99.2 F (37.3 C)     Temp Source 05/20/23 0903 Oral     SpO2 05/20/23 0903 94 %     Weight --      Height --      Head Circumference --      Peak Flow --      Pain Score 05/20/23 0906 8     Pain Loc --      Pain Education --      Exclude from Growth Chart --    No data found.  Updated Vital Signs BP (!) 148/83 (BP Location: Right Arm)   Pulse 90   Temp 99.2 F (37.3 C) (Oral)   Resp 14   SpO2 98%   Visual Acuity Right Eye Distance:   Left Eye Distance:   Bilateral Distance:    Right Eye Near:   Left Eye Near:    Bilateral Near:     Physical Exam Vitals and nursing note reviewed.  Constitutional:      General: She is not in acute distress.    Appearance: Normal appearance. She is not ill-appearing or toxic-appearing.  HENT:     Head: Normocephalic and atraumatic.     Right Ear: Tympanic membrane, ear canal and external ear normal.     Left Ear: Tympanic membrane, ear canal and external ear normal.     Nose: No congestion or rhinorrhea.     Mouth/Throat:     Mouth: Mucous membranes are moist.     Pharynx: Oropharynx is clear. Posterior oropharyngeal erythema  present. No oropharyngeal exudate.  Eyes:     General: No scleral icterus.    Extraocular Movements: Extraocular movements intact.  Cardiovascular:     Rate and Rhythm: Normal rate and regular rhythm.  Pulmonary:     Effort: Pulmonary effort is normal. No respiratory distress.     Breath sounds: Wheezing present. No rhonchi or rales.  Musculoskeletal:     Cervical back: Normal range of motion and neck supple.  Lymphadenopathy:     Cervical: No cervical adenopathy.  Skin:    General: Skin is warm and dry.     Coloration: Skin is not jaundiced or pale.     Findings: No erythema or rash.  Neurological:     Mental Status: She is alert and oriented to person, place, and time.  Psychiatric:        Behavior: Behavior is cooperative.      UC Treatments / Results  Labs (all labs ordered are listed, but only abnormal results are displayed) Labs Reviewed - No data to display  EKG   Radiology No results found.  Procedures Procedures (including critical care time)  Medications Ordered in UC Medications  ipratropium-albuterol (DUONEB) 0.5-2.5 (3) MG/3ML nebulizer solution 3 mL (3 mLs Nebulization Given 05/20/23 0928)  methylPREDNISolone acetate (DEPO-MEDROL) injection 40 mg (40 mg Intramuscular Given 05/20/23 0927)    Initial Impression / Assessment and Plan / UC Course  I have reviewed the triage vital signs and the nursing notes.  Pertinent labs & imaging results that were available during my care of the patient were reviewed by me and considered in my medical decision making (see chart for details).   Patient is well-appearing, normotensive, afebrile, not tachycardic, not tachypneic, oxygenating well on room  air.  Initially om room air, SpO2 is 94%.  After DuoNeb and Depo-Medrol injection, SpO2 increased to 98% on room air.  1. Viral URI with cough 2. Wheezing Vitals and exam are reassuring Viral testing deferred given length of symptoms DuoNeb given with improvement in  symptoms, start albuterol inhaler at home every 4-6 hours as needed for wheezing/shortness of breath and start oral prednisone tomorrow Chest x-ray pending at time of discharge, will contact patient later today with results and treat with antimicrobial therapy if positive for pneumonia Other supportive care discussed including Tessalon Perles, continue Mucinex/Delsym Strict ER precautions discussed with patient Work excuse provided  The patient was given the opportunity to ask questions.  All questions answered to their satisfaction.  The patient is in agreement to this plan.    Final Clinical Impressions(s) / UC Diagnoses   Final diagnoses:  Viral URI with cough  Wheezing     Discharge Instructions      We are treating you today for a viral upper respiratory infection.  The chest x-ray is pending and I will contact you later today if it shows pneumonia and we need to start antibiotic therapy.  If this is a viral illness, symptoms should improve over the next week to 10 days.  If you develop chest pain or shortness of breath, go to the emergency room.  We gave you a DuoNeb and injection of steroid medicine today.  Please start the oral steroids tomorrow morning and continue the albuterol inhaler at home every 4-6 hours as needed for wheezing or shortness of breath.  Some things that can make you feel better are: - Increased rest - Increasing fluid with water/sugar free electrolytes - Acetaminophen and ibuprofen as needed for fever/pain - Salt water gargling, chloraseptic spray and throat lozenges for sore throat - OTC guaifenesin (Mucinex) 1200 mg twice daily for congestion - Saline sinus flushes or a neti pot - Humidifying the air --Tessalon Perles every 8 hours as needed for dry cough      ED Prescriptions     Medication Sig Dispense Auth. Provider   albuterol (VENTOLIN HFA) 108 (90 Base) MCG/ACT inhaler Inhale 1-2 puffs into the lungs every 6 (six) hours as needed for  wheezing or shortness of breath. 6.7 g Cathlean Marseilles A, NP   predniSONE (DELTASONE) 20 MG tablet Take 2 tablets (40 mg total) by mouth daily with breakfast for 5 days. 10 tablet Cathlean Marseilles A, NP   benzonatate (TESSALON) 100 MG capsule Take 1 capsule (100 mg total) by mouth 3 (three) times daily as needed for cough. Do not take with alcohol or while driving or operating heavy machinery.  May cause drowsiness. 21 capsule Valentino Nose, NP      PDMP not reviewed this encounter.   Valentino Nose, NP 05/20/23 1052

## 2023-05-20 NOTE — Discharge Instructions (Signed)
We are treating you today for a viral upper respiratory infection.  The chest x-ray is pending and I will contact you later today if it shows pneumonia and we need to start antibiotic therapy.  If this is a viral illness, symptoms should improve over the next week to 10 days.  If you develop chest pain or shortness of breath, go to the emergency room.  We gave you a DuoNeb and injection of steroid medicine today.  Please start the oral steroids tomorrow morning and continue the albuterol inhaler at home every 4-6 hours as needed for wheezing or shortness of breath.  Some things that can make you feel better are: - Increased rest - Increasing fluid with water/sugar free electrolytes - Acetaminophen and ibuprofen as needed for fever/pain - Salt water gargling, chloraseptic spray and throat lozenges for sore throat - OTC guaifenesin (Mucinex) 1200 mg twice daily for congestion - Saline sinus flushes or a neti pot - Humidifying the air --Tessalon Perles every 8 hours as needed for dry cough

## 2023-05-20 NOTE — ED Triage Notes (Signed)
Pt c/o cough pt states she has been sick while out of town over the weekend with fever, chills, body aches, persistent productive cough, and lack of sleep due to coughing. Pain with coughing

## 2023-05-23 ENCOUNTER — Telehealth: Payer: BC Managed Care – PPO | Admitting: Physician Assistant

## 2023-05-23 ENCOUNTER — Other Ambulatory Visit: Payer: Self-pay

## 2023-05-23 DIAGNOSIS — J019 Acute sinusitis, unspecified: Secondary | ICD-10-CM | POA: Diagnosis not present

## 2023-05-23 DIAGNOSIS — B9689 Other specified bacterial agents as the cause of diseases classified elsewhere: Secondary | ICD-10-CM

## 2023-05-23 MED ORDER — DOXYCYCLINE HYCLATE 100 MG PO TABS
100.0000 mg | ORAL_TABLET | Freq: Two times a day (BID) | ORAL | 0 refills | Status: DC
  Filled 2023-05-23: qty 20, 10d supply, fill #0

## 2023-05-23 NOTE — Progress Notes (Signed)
I have spent 5 minutes in review of e-visit questionnaire, review and updating patient chart, medical decision making and response to patient.   Mia Milan Cody Jacklynn Dehaas, PA-C    

## 2023-05-23 NOTE — Progress Notes (Signed)

## 2023-05-24 ENCOUNTER — Telehealth: Payer: BC Managed Care – PPO | Admitting: Family Medicine

## 2023-05-24 DIAGNOSIS — H10021 Other mucopurulent conjunctivitis, right eye: Secondary | ICD-10-CM

## 2023-05-24 MED ORDER — POLYMYXIN B-TRIMETHOPRIM 10000-0.1 UNIT/ML-% OP SOLN: 1.0000 [drp] | Freq: Four times a day (QID) | OPHTHALMIC | 0 refills | Status: DC

## 2023-05-24 NOTE — Progress Notes (Signed)
Virtual Visit Consent   Jill Cook, you are scheduled for a virtual visit with a [facility] provider today. Just as with appointments in the office, your consent must be obtained to participate. Your consent will be active for this visit and any virtual visit you may have with one of our providers in the next 365 days. If you have a MyChart account, a copy of this consent can be sent to you electronically.  As this is a virtual visit, video technology does not allow for your provider to perform a traditional examination. This may limit your provider's ability to fully assess your condition. If your provider identifies any concerns that need to be evaluated in person or the need to arrange testing (such as labs, EKG, etc.), we will make arrangements to do so. Although advances in technology are sophisticated, we cannot ensure that it will always work on either your end or our end. If the connection with a video visit is poor, the visit may have to be switched to a telephone visit. With either a video or telephone visit, we are not always able to ensure that we have a secure connection.  By engaging in this virtual visit, you consent to the provision of healthcare and authorize for your insurance to be billed (if applicable) for the services provided during this visit. Depending on your insurance coverage, you may receive a charge related to this service.  I need to obtain your verbal consent now. Are you willing to proceed with your visit today? Jill Cook has provided verbal consent on 05/24/2023 for a virtual visit (video or telephone). Freddy Finner, NP  Date: 05/24/2023 10:01 AM  Virtual Visit via Video Note   I, Freddy Finner, connected with  Jill Cook  ([id number], 1965-11-16) on 05/24/23 at 10:00 AM EDT by a video-enabled telemedicine application and verified that I am speaking with the correct person using two identifiers.  Location: Patient: Virtual Visit  Location Patient: Home Provider: Virtual Visit Location Provider: Home Office   I discussed the limitations of evaluation and management by telemedicine and the availability of in person appointments. The patient expressed understanding and agreed to proceed.    History of Present Illness: Jill Cook is a 57 y.o. who identifies as a female who was assigned female at birth, and is being seen today for eye infection.  Onset was this morning, red scratchy, red, yellow drainage Associated symptoms are current URI infection Modifying factors are warm compresses  Denies chest pain, shortness of breath, fevers, chills No contacts. 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 08/22/2015   Generalized anxiety disorder 08/17/2015   Depression 08/17/2015   Gastroesophageal reflux disease with esophagitis without hemorrhage 10/14/2008   Irritable bowel syndrome 04/12/2008    Allergies:  Allergies  Allergen Reactions   Codeine Nausea And Vomiting and Hives   Morphine Hives and Nausea And Vomiting   Hydrocodone Nausea Only   Hydromorphone Hcl Nausea Only   Medications:  Current Outpatient Medications:    albuterol (VENTOLIN HFA) 108 (90 Base) MCG/ACT inhaler, Inhale 1-2 puffs into the lungs  every 6 (six) hours as needed for wheezing or shortness of breath., Disp: 6.7 g, Rfl: 0   ALPRAZolam (XANAX) 0.5 MG tablet, Take 1 tablet (0.5 mg total) by mouth daily as needed for anxiety., Disp: 5 tablet, Rfl: 0   aspirin EC 81 MG tablet, Take 81 mg by mouth daily., Disp: , Rfl:    azelastine (ASTELIN) 0.1 % nasal spray, Place 2  sprays into both nostrils every 12 (twelve) hours as needed for drainage., Disp: 30 mL, Rfl: 11   benzonatate (TESSALON) 100 MG capsule, Take 1 capsule (100 mg total) by mouth 3 (three) times daily as needed for cough. Do not take with alcohol or while driving or operating heavy machinery.  May cause drowsiness., Disp: 21 capsule, Rfl: 0   Cholecalciferol (VITAMIN D) 50 MCG (2000 UT) tablet, Take 2,000 Units by mouth daily., Disp: , Rfl:    doxycycline (VIBRA-TABS) 100 MG tablet, Take 1 tablet (100 mg total) by mouth 2 (two) times daily., Disp: 20 tablet, Rfl: 0   DULoxetine (CYMBALTA) 30 MG capsule, Take 1 capsule (30 mg total) by mouth daily., Disp: 90 capsule, Rfl: 2   famotidine (PEPCID) 20 MG tablet, Take 1 tablet (20 mg total) by mouth every 12 (twelve) hours as needed for reflux and heart burn., Disp: 90 tablet, Rfl: 0   metoprolol succinate (TOPROL-XL) 25 MG 24 hr tablet, Take 1 tablet (25 mg total) by mouth daily., Disp: 90 tablet, Rfl: 2   mupirocin ointment (BACTROBAN) 2 %, Place into the nose 2 (two) times daily with qtip for 10 days, then as needed for nasal sores/crusting., Disp: 22 g, Rfl: 10   Olopatadine HCl (PATADAY) 0.7 % SOLN, Place 1 drop into both eyes daily as needed for allergies., Disp: 5 mL, Rfl: 0   predniSONE (DELTASONE) 20 MG tablet, Take 2 tablets (40 mg total) by mouth daily with breakfast for 5 days., Disp: 10 tablet, Rfl: 0   triamcinolone (NASACORT) 55 MCG/ACT AERO nasal inhaler, Place 2 sprays into the nose daily., Disp: 32.4 mL, Rfl: 11  Observations/Objective: Patient is well-developed, well-nourished in no acute distress.  Resting comfortably  at home.  Head is normocephalic, atraumatic.  No labored breathing.  Speech is clear and coherent with logical content.  Patient is alert and oriented at baseline.  Right eye is red   Assessment and Plan:  1. Pink eye, right  - trimethoprim-polymyxin b (POLYTRIM) ophthalmic solution; Place 1 drop into the right eye  in the morning, at noon, in the evening, and at bedtime.  Dispense: 10 mL; Refill: 0  You have been diagnosed with an eye infection. You will be treated with the ordered medication, please take and use as directed.  -keep eyes clean and pat dry (you can use baby soap to wash them) -warm compresses -avoid rubbing -change pillowcases -toss make up out that has been recently used -avoid contacts or products in or on the eye until it has improved.  If you develop vision changes, pain with eye movement or changes or worsening in condition please go be seen in person.    Reviewed side effects, risks and benefits of medication.    Patient acknowledged agreement and understanding of the plan.   Past Medical, Surgical, Social History, Allergies, and Medications have been Reviewed.   Follow Up Instructions: I discussed the assessment and treatment plan with the patient. The patient was provided an opportunity to ask questions and all were answered. The patient agreed with the plan and  demonstrated an understanding of the instructions.  A copy of instructions were sent to the patient via MyChart unless otherwise noted below.    The patient was advised to call back or seek an in-person evaluation if the symptoms worsen or if the condition fails to improve as anticipated.    Freddy Finner, NP

## 2023-05-24 NOTE — Patient Instructions (Signed)
Carmin Richmond, thank you for joining Freddy Finner, NP for today's virtual visit.  While this provider is not your primary care provider (PCP), if your PCP is located in our provider database this encounter information will be shared with them immediately following your visit.   A [facility] MyChart account gives you access to today's visit and all your visits, tests, and labs performed at [facility] " click here if you don't have a [place] MyChart account or go to [email or web address]  Consent: (Patient) Jill Cook provided verbal consent for this virtual visit at the beginning of the encounter.  Current Medications:  Current Outpatient Medications:    trimethoprim-polymyxin b (POLYTRIM) ophthalmic solution, Place 1 drop into the right eye in the morning, at noon, in the evening, and at bedtime., Disp: 10 mL, Rfl: 0   albuterol (VENTOLIN HFA) 108 (90 Base) MCG/ACT inhaler, Inhale 1-2 puffs into the lungs every 6 (six) hours as needed for wheezing or shortness of breath., Disp: 6.7 g, Rfl: 0   ALPRAZolam (XANAX) 0.5 MG tablet, Take 1 tablet (0.5 mg total) by mouth daily as needed for anxiety., Disp: 5 tablet, Rfl: 0   aspirin EC 81 MG tablet, Take 81 mg by mouth daily., Disp: , Rfl:    azelastine (ASTELIN) 0.1 % nasal spray, Place 2 sprays into both nostrils every 12 (twelve) hours as needed for drainage., Disp: 30 mL, Rfl: 11   benzonatate (TESSALON) 100 MG capsule, Take 1 capsule (100 mg total) by mouth 3 (three) times daily as needed for cough. Do not take with alcohol or while driving or operating heavy machinery.  May cause drowsiness., Disp: 21 capsule, Rfl: 0   Cholecalciferol (VITAMIN D) 50 MCG (2000 UT) tablet, Take 2,000 Units by mouth daily., Disp: , Rfl:    doxycycline (VIBRA-TABS) 100 MG tablet, Take 1 tablet (100 mg total) by mouth 2 (two) times daily., Disp: 20 tablet, Rfl: 0   DULoxetine (CYMBALTA) 30 MG capsule, Take 1 capsule (30 mg  total) by mouth daily., Disp: 90 capsule, Rfl: 2   famotidine (PEPCID) 20 MG tablet, Take 1 tablet (20 mg total) by mouth every 12 (twelve) hours as needed for reflux and heart burn., Disp: 90 tablet, Rfl: 0   metoprolol succinate (TOPROL-XL) 25 MG 24 hr tablet, Take 1 tablet (25 mg total) by mouth daily., Disp: 90 tablet, Rfl: 2   mupirocin ointment (BACTROBAN) 2 %, Place into the nose 2 (two) times daily with qtip for 10 days, then as needed for nasal sores/crusting., Disp: 22 g, Rfl: 10   Olopatadine HCl (PATADAY) 0.7 % SOLN, Place 1 drop into both eyes daily as needed for allergies., Disp: 5 mL, Rfl: 0   predniSONE (DELTASONE) 20 MG tablet, Take 2 tablets (40 mg total) by mouth daily with breakfast for 5 days., Disp: 10 tablet, Rfl: 0   triamcinolone (NASACORT) 55 MCG/ACT AERO nasal inhaler, Place 2 sprays into the nose daily., Disp: 32.4 mL, Rfl: 11   Medications ordered in this encounter:  Meds ordered this encounter  Medications   trimethoprim-polymyxin b (POLYTRIM) ophthalmic solution    Sig: Place 1 drop into the right eye in the morning, at noon, in the evening, and at bedtime.    Dispense:  10 mL    Refill:  0    Order Specific Question:   Supervising Provider    Answer:   Merrilee Jansky [id number]     *If you need refills on other medications  prior to your next appointment, please contact your pharmacy*  Follow-Up: Call back or seek an in-person evaluation if the symptoms worsen or if the condition fails to improve as anticipated.  [facility] [phone]  Other Instructions   -Use eye medication as ordered -Advised to change pillowcase after 2-3 days of medication use to avoid reinfection -Keep eye clean and dry -Avoid rubbing -Wash hands frequently -Warm compresses used prior to eye medication -Follow up in person if not improving in next 24-48 hours of starting medication, if swelling occurs, or develop vision changes or worsening blurry  vision. -Tylenol for any discomfort    If you have been instructed to have an in-person evaluation today at a local Urgent Care facility, please use the link below. It will take you to a list of all of our available [facility], including address, phone number and hours of operation. Please do not delay care.  [facility]  If you or a family member do not have a primary care provider, use the link below to schedule a visit and establish care. When you choose a [facility] primary care physician or advanced practice provider, you gain a long-term partner in health. Find a Primary Care Provider  Learn more about [facility] in-office and virtual care options: [facility] - Get Care Now

## 2023-07-14 ENCOUNTER — Other Ambulatory Visit: Payer: Self-pay

## 2023-07-14 ENCOUNTER — Telehealth: Payer: BC Managed Care – PPO | Admitting: Family

## 2023-07-14 DIAGNOSIS — J4521 Mild intermittent asthma with (acute) exacerbation: Secondary | ICD-10-CM

## 2023-07-14 MED ORDER — PREDNISONE 20 MG PO TABS
40.0000 mg | ORAL_TABLET | Freq: Every day | ORAL | 0 refills | Status: AC
  Filled 2023-07-14: qty 14, 7d supply, fill #0

## 2023-07-14 MED ORDER — ALBUTEROL SULFATE HFA 108 (90 BASE) MCG/ACT IN AERS
2.0000 | INHALATION_SPRAY | Freq: Four times a day (QID) | RESPIRATORY_TRACT | 0 refills | Status: DC | PRN
  Filled 2023-07-14: qty 6.7, 25d supply, fill #0

## 2023-07-14 NOTE — Progress Notes (Signed)

## 2023-08-30 ENCOUNTER — Other Ambulatory Visit: Payer: Self-pay | Admitting: Family Medicine

## 2023-08-30 ENCOUNTER — Other Ambulatory Visit: Payer: Self-pay

## 2023-08-30 ENCOUNTER — Encounter: Payer: Self-pay | Admitting: Family Medicine

## 2023-08-30 DIAGNOSIS — F410 Panic disorder [episodic paroxysmal anxiety] without agoraphobia: Secondary | ICD-10-CM

## 2023-08-30 DIAGNOSIS — F341 Dysthymic disorder: Secondary | ICD-10-CM

## 2023-08-30 MED ORDER — DULOXETINE HCL 60 MG PO CPEP
60.0000 mg | ORAL_CAPSULE | Freq: Every day | ORAL | 0 refills | Status: DC
  Filled 2023-08-30 – 2023-09-12 (×2): qty 30, 30d supply, fill #0

## 2023-09-09 ENCOUNTER — Other Ambulatory Visit: Payer: Self-pay

## 2023-09-10 ENCOUNTER — Other Ambulatory Visit: Payer: Self-pay

## 2023-09-12 ENCOUNTER — Other Ambulatory Visit: Payer: Self-pay

## 2023-09-27 ENCOUNTER — Other Ambulatory Visit: Payer: Self-pay

## 2023-09-27 ENCOUNTER — Ambulatory Visit: Payer: 59 | Admitting: Family Medicine

## 2023-09-27 ENCOUNTER — Encounter: Payer: Self-pay | Admitting: Family Medicine

## 2023-09-27 VITALS — BP 148/92 | HR 84 | Temp 97.9°F | Resp 16 | Ht 66.0 in | Wt 220.4 lb

## 2023-09-27 DIAGNOSIS — I10 Essential (primary) hypertension: Secondary | ICD-10-CM

## 2023-09-27 DIAGNOSIS — K581 Irritable bowel syndrome with constipation: Secondary | ICD-10-CM

## 2023-09-27 DIAGNOSIS — R202 Paresthesia of skin: Secondary | ICD-10-CM

## 2023-09-27 DIAGNOSIS — R682 Dry mouth, unspecified: Secondary | ICD-10-CM

## 2023-09-27 DIAGNOSIS — Z23 Encounter for immunization: Secondary | ICD-10-CM

## 2023-09-27 DIAGNOSIS — F341 Dysthymic disorder: Secondary | ICD-10-CM | POA: Diagnosis not present

## 2023-09-27 DIAGNOSIS — E785 Hyperlipidemia, unspecified: Secondary | ICD-10-CM

## 2023-09-27 DIAGNOSIS — F410 Panic disorder [episodic paroxysmal anxiety] without agoraphobia: Secondary | ICD-10-CM

## 2023-09-27 DIAGNOSIS — R1313 Dysphagia, pharyngeal phase: Secondary | ICD-10-CM

## 2023-09-27 DIAGNOSIS — Z131 Encounter for screening for diabetes mellitus: Secondary | ICD-10-CM

## 2023-09-27 DIAGNOSIS — H04123 Dry eye syndrome of bilateral lacrimal glands: Secondary | ICD-10-CM

## 2023-09-27 DIAGNOSIS — K2100 Gastro-esophageal reflux disease with esophagitis, without bleeding: Secondary | ICD-10-CM

## 2023-09-27 DIAGNOSIS — E559 Vitamin D deficiency, unspecified: Secondary | ICD-10-CM

## 2023-09-27 MED ORDER — FAMOTIDINE 20 MG PO TABS
20.0000 mg | ORAL_TABLET | Freq: Every day | ORAL | 0 refills | Status: DC
  Filled 2023-09-27 – 2023-10-14 (×2): qty 90, 90d supply, fill #0

## 2023-09-27 MED ORDER — METOPROLOL SUCCINATE ER 25 MG PO TB24
25.0000 mg | ORAL_TABLET | Freq: Every day | ORAL | 2 refills | Status: AC
  Filled 2023-09-27 – 2023-12-29 (×2): qty 90, 90d supply, fill #0
  Filled 2024-04-21: qty 90, 90d supply, fill #1
  Filled 2024-07-21: qty 90, 90d supply, fill #2

## 2023-09-27 MED ORDER — DULOXETINE HCL 60 MG PO CPEP
60.0000 mg | ORAL_CAPSULE | Freq: Every day | ORAL | 0 refills | Status: DC
  Filled 2023-09-27 – 2023-10-13 (×2): qty 90, 90d supply, fill #0

## 2023-09-27 NOTE — Progress Notes (Signed)
 Name: Jill Cook   MRN: [id number]    DOB: April 11, 1966   Date:09/27/2023       Progress Note  Subjective  Chief Complaint  Chief Complaint  Patient presents with   Medical Management of Chronic Issues   HPI    IBS: she feels bloated, intermittent abdominal pain, mostly constipation type Amitiza is not working, she is trying a FODMAP diet with Dr. Manson Passey - functional dietician and is getting better . No longer having lower abdominal pain   GERD: she was on Omeprazole but notice muscle pain, currently taking pepcid prn, seen by ENT in the past and had EGD in the past. She is now noticing hoarseness, dysphagia to solids such as pasta and bread.    Depression/Anxiety: she contacted our office about one month ago asking to go up on dose of Duloxetine from 30 mg to 60 mg due to crying spells followed by guilty feeling, anhedonia, she is now taking 60 mg and she states crying resolved and is feeling better    HTN: BP was elevated when she came in but did not take Metoprolol this morning. She denies chest pain and intermittent palpitation She has seen cardiologist in the past    Family history of heart disease and stroke: she would like to have some other labs done    She is worried about Sjogren's disease, she states there is a family history of auto immune disorder such as RA - several cousins. She has noticed dry , itchy eyes, dry mouth , joint pain mostly on knees, shoulder and hips, fatigue that is more than usual, and intermittent lymphadenopathy  Morbid obesity: BMI over 35 with arthralgias, GERD, HTN and dyslipidemia. Discussed medication, weight watchers.    Patient Active Problem List   Diagnosis Date Noted   History of endometriosis 04/15/2020   History of hypertension 05/11/2019   Palpitations 05/11/2019   Bilateral carpal tunnel syndrome 01/08/2019   Polyp of colon 01/08/2019   Family history of colorectal cancer 01/08/2019   Obese 08/23/2015   S/P conversion from  right UKR to TKA 08/22/2015   Generalized anxiety disorder 08/17/2015   Depression 08/17/2015   Gastroesophageal reflux disease with esophagitis without hemorrhage 10/14/2008   Irritable bowel syndrome 04/12/2008    Past Surgical History:  Procedure Laterality Date   ANAL FISSURE REPAIR  06/13/2000   ARTHRODESIS METATARSAL Left 01/05/2022   [facility]   BONE CYST EXCISION Left 01/05/2022   [facility]   BUNIONECTOMY Right 07/30/1989   CESAREAN SECTION  07/30/1992   COLONOSCOPY WITH PROPOFOL N/A 06/03/2019   Procedure: COLONOSCOPY WITH PROPOFOL;  Surgeon: Pasty Spillers, MD;  Location: [facility];  Service: Endoscopy;  Laterality: N/A;   CONVERSION TO TOTAL KNEE Right 08/22/2015   Procedure: partial knee CONVERSION TO RIGHT TOTAL KNEE;  Surgeon: Durene Romans, MD;  Location: [facility];  Service: Orthopedics;  Laterality: Right;   CYST REMOVAL WITH BONE GRAFT Left 01/05/2022   [facility]   D & C HYSTEROSCOPY /  NOVASURE ENDOMETRIAL ABLATION  11/15/2005   ESOPHAGOGASTRODUODENOSCOPY (EGD) WITH PROPOFOL N/A 06/03/2019   Procedure: ESOPHAGOGASTRODUODENOSCOPY (EGD) WITH PROPOFOL;  Surgeon: Pasty Spillers, MD;  Location: [facility];  Service: Endoscopy;  Laterality: N/A;   HAMMER TOE SURGERY Left 01/05/2022   KNEE ARTHROSCOPY W/ MENISCECTOMY Right    KNEE ARTHROSCOPY WITH MEDIAL MENISECTOMY Right 10/06/2013   Procedure: RIGHT KNEE ARTHROSCOPY WITH CHONDROPLASTY;  Surgeon: Eugenia Mcalpine, MD;  Location: [facility];  Service: Orthopedics;  Laterality: Right;  LAPAROSCOPY N/A 11/01/2015   Procedure: LAPAROSCOPY DIAGNOSTIC;  Surgeon: Richarda Overlie, MD;  Location[facility];  Service: Gynecology;  Laterality: N/A;   RIGHT KNEE PATELLOFEMORAL ARTHROPLASTY  06/10/2008   SEPTOPLASTY  07/30/1998   TONSILLECTOMY  AGE 58   TRANSTHORACIC ECHOCARDIOGRAM  06/03/2008   NORMAL /  EF 60%   TUBAL LIGATION  07/30/1994    Family History  Problem Relation Age  of Onset   Hypertension Mother    Thyroid disease Mother    Atrial fibrillation Mother    Stroke Mother    Heart disease Father    Heart attack Father 48   Colon cancer Maternal Aunt        dx in her 22's   Breast cancer Paternal Aunt    Uterine cancer Paternal Aunt    Esophageal cancer Maternal Grandfather    Hypertension Paternal Grandfather    Diabetes Paternal Grandfather     Social History   Tobacco Use   Smoking status: Never   Smokeless tobacco: Never  Substance Use Topics   Alcohol use: Yes    Comment: 1 drink per month     Current Outpatient Medications:    albuterol (VENTOLIN HFA) 108 (90 Base) MCG/ACT inhaler, Inhale 2 puffs into the lungs every 6 (six) hours as needed for wheezing or shortness of breath., Disp: 6.7 g, Rfl: 0   ALPRAZolam (XANAX) 0.5 MG tablet, Take 1 tablet (0.5 mg total) by mouth daily as needed for anxiety., Disp: 5 tablet, Rfl: 0   aspirin EC 81 MG tablet, Take 81 mg by mouth daily., Disp: , Rfl:    azelastine (ASTELIN) 0.1 % nasal spray, Place 2 sprays into both nostrils every 12 (twelve) hours as needed for drainage., Disp: 30 mL, Rfl: 11   Cholecalciferol (VITAMIN D) 50 MCG (2000 UT) tablet, Take 2,000 Units by mouth daily., Disp: , Rfl:    DULoxetine (CYMBALTA) 60 MG capsule, Take 1 capsule (60 mg total) by mouth daily., Disp: 30 capsule, Rfl: 0   famotidine (PEPCID) 20 MG tablet, Take 1 tablet (20 mg total) by mouth every 12 (twelve) hours as needed for reflux and heart burn., Disp: 90 tablet, Rfl: 0   metoprolol succinate (TOPROL-XL) 25 MG 24 hr tablet, Take 1 tablet (25 mg total) by mouth daily., Disp: 90 tablet, Rfl: 2   mupirocin ointment (BACTROBAN) 2 %, Place into the nose 2 (two) times daily with qtip for 10 days, then as needed for nasal sores/crusting., Disp: 22 g, Rfl: 10   Olopatadine HCl (PATADAY) 0.7 % SOLN, Place 1 drop into both eyes daily as needed for allergies., Disp: 5 mL, Rfl: 0   triamcinolone (NASACORT) 55 MCG/ACT AERO  nasal inhaler, Place 2 sprays into the nose daily., Disp: 32.4 mL, Rfl: 11   trimethoprim-polymyxin b (POLYTRIM) ophthalmic solution, Place 1 drop into the right eye in the morning, at noon, in the evening, and at bedtime., Disp: 10 mL, Rfl: 0   benzonatate (TESSALON) 100 MG capsule, Take 1 capsule (100 mg total) by mouth 3 (three) times daily as needed for cough. Do not take with alcohol or while driving or operating heavy machinery.  May cause drowsiness. (Patient not taking: Reported on 09/27/2023), Disp: 21 capsule, Rfl: 0  Allergies  Allergen Reactions   Codeine Nausea And Vomiting and Hives   Morphine Hives and Nausea And Vomiting   Hydrocodone Nausea Only   Hydromorphone Hcl Nausea Only    I personally reviewed active problem list, medication  list, allergies, family history with the patient/caregiver today.   ROS  Ten systems reviewed and is negative except as mentioned in HPI    Objective  Vitals:   09/27/23 1047  BP: (!) 154/92  Pulse: 84  Resp: 16  Temp: 97.9 F (36.6 C)  TempSrc: Oral  SpO2: 97%  Weight: 220 lb 6.4 oz (100 kg)  Height: 5\' 6"  (1.676 m)    Body mass index is 35.57 kg/m.  Physical Exam  Constitutional: Patient appears well-developed and well-nourished. Obese  No distress.  HEENT: head atraumatic, normocephalic, pupils equal and reactive to light, neck supple Cardiovascular: Normal rate, regular rhythm and normal heart sounds.  No murmur heard. No BLE edema. Pulmonary/Chest: Effort normal and breath sounds normal. No respiratory distress. Abdominal: Soft.  There is no tenderness. Psychiatric: Patient has a normal mood and affect. behavior is normal. Judgment and thought content normal.    Diabetic Foot Exam:     PHQ2/9:    09/27/2023   10:41 AM 03/13/2023   10:05 AM 11/21/2022    3:22 PM 03/28/2022    2:04 PM 10/31/2021    1:06 PM  Depression screen PHQ 2/9  Decreased Interest 1 0 0 0 0  Down, Depressed, Hopeless 1 0 0 0 1  PHQ - 2 Score  2 0 0 0 1  Altered sleeping 1 0 0 0 2  Tired, decreased energy 1 2 0 0 3  Change in appetite 0 0 0 0 0  Feeling bad or failure about yourself  0 0 0 0 0  Trouble concentrating 1 0 0 0 0  Moving slowly or fidgety/restless 0 0 0 0 0  Suicidal thoughts 0 0 0 0 0  PHQ-9 Score 5 2 0 0 6  Difficult doing work/chores Somewhat difficult  Not difficult at all Not difficult at all     phq 9 is positive  Fall Risk:    03/13/2023   10:05 AM 11/21/2022    3:22 PM 03/28/2022    2:03 PM 10/31/2021    1:06 PM 05/30/2021    8:33 AM  Fall Risk   Falls in the past year? 0 0 0 0 0  Number falls in past yr: 0 0 0 0 0  Injury with Fall? 0 0 0 0 0  Risk for fall due to : No Fall Risks No Fall Risks No Fall Risks No Fall Risks   Follow up Falls prevention discussed Falls prevention discussed;Education provided;Falls evaluation completed Falls prevention discussed;Education provided Falls prevention discussed      Assessment & Plan  1. Dysthymia (Primary)  - DULoxetine (CYMBALTA) 60 MG capsule; Take 1 capsule (60 mg total) by mouth daily.  Dispense: 90 capsule; Refill: 0  2. Dyslipidemia  - Lipid panel  3. Morbid obesity (HCC)  Discussed with the patient the risk posed by an increased BMI. Discussed importance of portion control, calorie counting and at least 150 minutes of physical activity weekly. Avoid sweet beverages and drink more water. Eat at least 6 servings of fruit and vegetables daily    4. Dry mouth and eyes  - Sedimentation rate - C-reactive protein - Sjogrens syndrome-A extractable nuclear antibody - Sjogrens syndrome-B extractable nuclear antibody - ANA,IFA RA Diag Pnl w/rflx Tit/Patn  5. Gastroesophageal reflux disease with esophagitis without hemorrhage  - Ambulatory referral to Gastroenterology  6. Essential hypertension  - metoprolol succinate (TOPROL-XL) 25 MG 24 hr tablet; Take 1 tablet (25 mg total) by mouth daily.  Dispense: 90  tablet; Refill: 2  7. Pharyngeal  dysphagia  - Ambulatory referral to Gastroenterology  8. Vitamin D deficiency  - VITAMIN D 25 Hydroxy (Vit-D Deficiency, Fractures)  9. Irritable bowel syndrome with constipation  Continue Amitiza  10. Paresthesia  - B12 and Folate Panel - CBC with Differential/Platelet  11. Hypertension, benign  - COMPLETE METABOLIC PANEL WITH GFR - CBC with Differential/Platelet - TSH  12. Diabetes mellitus screening  - Hemoglobin A1c  13. Panic attack  - DULoxetine (CYMBALTA) 60 MG capsule; Take 1 capsule (60 mg total) by mouth daily.  Dispense: 90 capsule; Refill: 0   14. Immunization due  - Pneumococcal conjugate vaccine 20-valent

## 2023-10-01 LAB — LIPID PANEL
Cholesterol: 177 mg/dL (ref <200)
HDL: 42 mg/dL — ABNORMAL LOW (ref >=50)
LDL Cholesterol (Calc): 104 mg/dL — ABNORMAL HIGH
Non-HDL Cholesterol (Calc): 135 mg/dL — ABNORMAL HIGH (ref <130)
Total CHOL/HDL Ratio: 4.2 (calc) (ref <5.0)
Triglycerides: 185 mg/dL — ABNORMAL HIGH (ref <150)

## 2023-10-01 LAB — COMPLETE METABOLIC PANEL WITH GFR
AG Ratio: 2 (calc) (ref 1.0–2.5)
ALT: 19 U/L (ref 6–29)
AST: 18 U/L (ref 10–35)
Albumin: 4.4 g/dL (ref 3.6–5.1)
Alkaline phosphatase (APISO): 63 U/L (ref 37–153)
BUN: 12 mg/dL (ref 7–25)
CO2: 30 mmol/L (ref 20–32)
Calcium: 9.3 mg/dL (ref 8.6–10.4)
Chloride: 101 mmol/L (ref 98–110)
Creat: 0.81 mg/dL (ref 0.50–1.03)
Globulin: 2.2 g/dL (ref 1.9–3.7)
Glucose, Bld: 88 mg/dL (ref 65–99)
Potassium: 4 mmol/L (ref 3.5–5.3)
Sodium: 139 mmol/L (ref 135–146)
Total Bilirubin: 0.4 mg/dL (ref 0.2–1.2)
Total Protein: 6.6 g/dL (ref 6.1–8.1)
eGFR: 85 mL/min/{1.73_m2} (ref >=60)

## 2023-10-01 LAB — SJOGRENS SYNDROME-A EXTRACTABLE NUCLEAR ANTIBODY: SSA (Ro) (ENA) Antibody, IgG: <1 AI

## 2023-10-01 LAB — CBC WITH DIFFERENTIAL/PLATELET
Absolute Lymphocytes: 1555 {cells}/uL (ref 850–3900)
Absolute Monocytes: 313 {cells}/uL (ref 200–950)
Basophils Absolute: 49 {cells}/uL (ref 0–200)
Basophils Relative: 0.9 %
Eosinophils Absolute: 130 {cells}/uL (ref 15–500)
Eosinophils Relative: 2.4 %
HCT: 38.1 % (ref 35.0–45.0)
Hemoglobin: 12.9 g/dL (ref 11.7–15.5)
MCH: 29.7 pg (ref 27.0–33.0)
MCHC: 33.9 g/dL (ref 32.0–36.0)
MCV: 87.6 fL (ref 80.0–100.0)
MPV: 10.3 fL (ref 7.5–12.5)
Monocytes Relative: 5.8 %
Neutro Abs: 3353 {cells}/uL (ref 1500–7800)
Neutrophils Relative %: 62.1 %
Platelets: 171 10*3/uL (ref 140–400)
RBC: 4.35 10*6/uL (ref 3.80–5.10)
RDW: 13.2 % (ref 11.0–15.0)
Total Lymphocyte: 28.8 %
WBC: 5.4 10*3/uL (ref 3.8–10.8)

## 2023-10-01 LAB — HEMOGLOBIN A1C
Hgb A1c MFr Bld: 5.6 %{Hb} (ref <5.7)
Mean Plasma Glucose: 114 mg/dL
eAG (mmol/L): 6.3 mmol/L

## 2023-10-01 LAB — B12 AND FOLATE PANEL
Folate: >24 ng/mL
Vitamin B-12: 522 pg/mL (ref 200–1100)

## 2023-10-01 LAB — SJOGRENS SYNDROME-B EXTRACTABLE NUCLEAR ANTIBODY: SSB (La) (ENA) Antibody, IgG: <1 AI

## 2023-10-01 LAB — ANA,IFA RA DIAG PNL W/RFLX TIT/PATN
Anti Nuclear Antibody (ANA): POSITIVE — AB
Cyclic Citrullin Peptide Ab: <16 U
Rheumatoid fact SerPl-aCnc: <10 [IU]/mL (ref <14)

## 2023-10-01 LAB — ANTI-NUCLEAR AB-TITER (ANA TITER): ANA Titer 1: 1:80 {titer} — ABNORMAL HIGH

## 2023-10-01 LAB — C-REACTIVE PROTEIN: CRP: 3 mg/L (ref <8.0)

## 2023-10-01 LAB — TSH: TSH: 1.57 m[IU]/L (ref 0.40–4.50)

## 2023-10-01 LAB — VITAMIN D 25 HYDROXY (VIT D DEFICIENCY, FRACTURES): Vit D, 25-Hydroxy: 40 ng/mL (ref 30–100)

## 2023-10-01 LAB — SEDIMENTATION RATE: Sed Rate: 6 mm/h (ref 0–30)

## 2023-10-02 ENCOUNTER — Encounter: Payer: Self-pay | Admitting: Family Medicine

## 2023-10-02 ENCOUNTER — Other Ambulatory Visit: Payer: Self-pay | Admitting: Family Medicine

## 2023-10-02 DIAGNOSIS — H04123 Dry eye syndrome of bilateral lacrimal glands: Secondary | ICD-10-CM

## 2023-10-02 DIAGNOSIS — R768 Other specified abnormal immunological findings in serum: Secondary | ICD-10-CM

## 2023-10-02 DIAGNOSIS — Z832 Family history of diseases of the blood and blood-forming organs and certain disorders involving the immune mechanism: Secondary | ICD-10-CM

## 2023-10-11 ENCOUNTER — Other Ambulatory Visit: Payer: Self-pay

## 2023-10-11 ENCOUNTER — Ambulatory Visit: Payer: Self-pay | Admitting: Family Medicine

## 2023-10-13 ENCOUNTER — Other Ambulatory Visit: Payer: Self-pay

## 2023-10-14 ENCOUNTER — Other Ambulatory Visit: Payer: Self-pay

## 2023-11-04 ENCOUNTER — Ambulatory Visit
Admission: RE | Admit: 2023-11-04 | Discharge: 2023-11-04 | Disposition: A | Discharge status: Home / Self Care | Source: Ambulatory Visit | Attending: Internal Medicine | Admitting: Internal Medicine

## 2023-11-04 ENCOUNTER — Other Ambulatory Visit: Payer: Self-pay

## 2023-11-04 VITALS — BP 127/85 | HR 100 | Temp 99.4°F | Resp 16

## 2023-11-04 DIAGNOSIS — J029 Acute pharyngitis, unspecified: Secondary | ICD-10-CM | POA: Insufficient documentation

## 2023-11-04 DIAGNOSIS — R051 Acute cough: Secondary | ICD-10-CM | POA: Diagnosis not present

## 2023-11-04 LAB — POC COVID19/FLU A&B COMBO
Covid Antigen, POC: NEGATIVE
Influenza A Antigen, POC: NEGATIVE
Influenza B Antigen, POC: NEGATIVE

## 2023-11-04 LAB — POCT RAPID STREP A (OFFICE): Rapid Strep A Screen: NEGATIVE

## 2023-11-04 MED ORDER — ONDANSETRON 4 MG PO TBDP
4.0000 mg | ORAL_TABLET | Freq: Three times a day (TID) | ORAL | 0 refills | Status: DC | PRN
  Filled 2023-11-04: qty 18, 21d supply, fill #0

## 2023-11-04 NOTE — ED Provider Notes (Signed)
 [facility]    CSN: [id number] Arrival date & time: 11/04/23  1305      History   Chief Complaint Chief Complaint  Patient presents with   Sore Throat    Severe pain with swallowing.  Left side of neck painful to touch, vomiting, dizziness - Entered by patient    HPI Jill Cook is a 58 y.o. female.   Jill Cook is a 58 y.o. female presenting for chief complaint of Sore Throat that started 3-4 days ago and left-sided neck lymph node swelling that started yesterday. She additionally reports nausea with 3-4 episodes of non-bilious/non-bloody emesis over the last 24 hours. Reports generalized headache. Sore throat is currently 9/10 and worsened by swallowing/coughing. She is not currently nauseous and denies abdominal pain, diarrhea, urinary symptoms, dizziness, shortness of breath, chest pain, heart palpitations, ear pain. 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 Denies recent antibiotic/steroid use. Taking OTC medications at home without much relief.    Sore Throat    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 08/22/2015   Generalized anxiety disorder 08/17/2015   Depression 08/17/2015   Gastroesophageal reflux disease with esophagitis without hemorrhage 10/14/2008   Irritable bowel syndrome 04/12/2008    Past Surgical History:   Procedure Laterality Date   ANAL FISSURE REPAIR  06/13/2000   ARTHRODESIS METATARSAL Left 01/05/2022   [facility]   BONE CYST EXCISION Left 01/05/2022   [facility]   BUNIONECTOMY Right 07/30/1989   CESAREAN SECTION  07/30/1992   COLONOSCOPY WITH PROPOFOL N/A 06/03/2019   Procedure: COLONOSCOPY WITH PROPOFOL;  Surgeon: Pasty Spillers, MD;  Location: [facility];  Service: Endoscopy;  Laterality: N/A;   CONVERSION TO TOTAL KNEE Right 08/22/2015   Procedure: partial knee CONVERSION TO RIGHT TOTAL KNEE;  Surgeon: Durene Romans, MD;  Location: [facility];  Service: Orthopedics;  Laterality: Right;   CYST REMOVAL WITH BONE GRAFT Left 01/05/2022   [facility]   D & C HYSTEROSCOPY /  NOVASURE ENDOMETRIAL ABLATION  11/15/2005   ESOPHAGOGASTRODUODENOSCOPY (EGD) WITH PROPOFOL N/A 06/03/2019   Procedure: ESOPHAGOGASTRODUODENOSCOPY (EGD) WITH PROPOFOL;  Surgeon: Pasty Spillers, MD;  Location: [facility];  Service: Endoscopy;  Laterality: N/A;   HAMMER TOE SURGERY Left 01/05/2022   KNEE ARTHROSCOPY W/ MENISCECTOMY Right    KNEE ARTHROSCOPY WITH MEDIAL MENISECTOMY Right 10/06/2013   Procedure: RIGHT KNEE ARTHROSCOPY WITH CHONDROPLASTY;  Surgeon: Eugenia Mcalpine, MD;  Location: [facility];  Service: Orthopedics;  Laterality: Right;   LAPAROSCOPY N/A 11/01/2015   Procedure: LAPAROSCOPY DIAGNOSTIC;  Surgeon: Richarda Overlie, MD;  Location: [facility]  Service: Gynecology;  Laterality: N/A;   RIGHT KNEE PATELLOFEMORAL ARTHROPLASTY  06/10/2008   SEPTOPLASTY  07/30/1998   TONSILLECTOMY  AGE 59  TRANSTHORACIC ECHOCARDIOGRAM  06/03/2008   NORMAL /  EF 60%   TUBAL LIGATION  07/30/1994    OB History   No obstetric history on file.      Home Medications    Prior to Admission medications   Medication Sig Start Date End Date Taking? Authorizing Provider  aspirin EC 81 MG tablet Take 81 mg by mouth daily.   Yes [provider]  Cholecalciferol (VITAMIN  D) 50 MCG (2000 UT) tablet Take 2,000 Units by mouth daily.   Yes [provider]  DULoxetine (CYMBALTA) 60 MG capsule Take 1 capsule (60 mg total) by mouth daily. 09/27/23  Yes Sowles, Danna Hefty, MD  famotidine (PEPCID) 20 MG tablet Take 1 tablet (20 mg total) by mouth daily. 09/27/23  Yes Sowles, Danna Hefty, MD  metoprolol succinate (TOPROL-XL) 25 MG 24 hr tablet Take 1 tablet (25 mg total) by mouth daily. 09/27/23  Yes Sowles, Danna Hefty, MD  ondansetron (ZOFRAN-ODT) 4 MG disintegrating tablet Take 1 tablet (4 mg total) by mouth every 8 (eight) hours as needed for nausea or vomiting. 11/04/23  Yes Carlisle Beers, FNP  albuterol (VENTOLIN HFA) 108 (90 Base) MCG/ACT inhaler Inhale 2 puffs into the lungs every 6 (six) hours as needed for wheezing or shortness of breath. 07/14/23   Junie Spencer, FNP  ALPRAZolam Prudy Feeler) 0.5 MG tablet Take 1 tablet (0.5 mg total) by mouth daily as needed for anxiety. 11/21/22   Alba Cory, MD  azelastine (ASTELIN) 0.1 % nasal spray Place 2 sprays into both nostrils every 12 (twelve) hours as needed for drainage. 10/02/22     triamcinolone (NASACORT) 55 MCG/ACT AERO nasal inhaler Place 2 sprays into the nose daily. 10/02/22   Bud Face, MD    Family History Family History  Problem Relation Age of Onset   Hypertension Mother    Thyroid disease Mother    Atrial fibrillation Mother    Stroke Mother    Heart disease Father    Heart attack Father 12   Colon cancer Maternal Aunt        dx in her 27's   Breast cancer Paternal Aunt    Uterine cancer Paternal Aunt    Esophageal cancer Maternal Grandfather    Hypertension Paternal Grandfather    Diabetes Paternal Grandfather     Social History Social History   Tobacco Use   Smoking status: Never   Smokeless tobacco: Never  Vaping Use   Vaping status: Never Used  Substance Use Topics   Alcohol use: Yes    Comment: 1 drink per month   Drug use: No     Allergies   Codeine, Morphine,  Hydrocodone, and Hydromorphone hcl   Review of Systems Review of Systems Per HPI  Physical Exam Triage Vital Signs ED Triage Vitals [11/04/23 1322]  Encounter Vitals Group     BP 127/85     Systolic BP Percentile      Diastolic BP Percentile      Pulse Rate 100     Resp 16     Temp 99.4 F (37.4 C)     Temp Source Oral     SpO2 94 %     Weight      Height      Head Circumference      Peak Flow      Pain Score 9     Pain Loc      Pain Education      Exclude from Growth Chart  No data found.  Updated Vital Signs BP 127/85 (BP Location: Right Arm)   Pulse 100   Temp 99.4 F (37.4 C) (Oral)   Resp 16   SpO2 94%   Visual Acuity Right Eye Distance:   Left Eye Distance:   Bilateral Distance:    Right Eye Near:   Left Eye Near:    Bilateral Near:     Physical Exam Vitals and nursing note reviewed.  Constitutional:      Appearance: She is not ill-appearing or toxic-appearing.  HENT:     Head: Normocephalic and atraumatic.     Right Ear: Hearing, tympanic membrane, ear canal and external ear normal.     Left Ear: Hearing, tympanic membrane, ear canal and external ear normal.     Nose: Nose normal.     Mouth/Throat:     Lips: Pink.     Mouth: Mucous membranes are moist. No injury or oral lesions.     Dentition: Normal dentition.     Tongue: No lesions.     Pharynx: Uvula midline. Pharyngeal swelling and posterior oropharyngeal erythema present. No oropharyngeal exudate, uvula swelling or postnasal drip.     Tonsils: No tonsillar exudate. 1+ on the right. 1+ on the left.  Eyes:     General: Lids are normal. Vision grossly intact. Gaze aligned appropriately.     Extraocular Movements: Extraocular movements intact.     Conjunctiva/sclera: Conjunctivae normal.  Neck:     Trachea: Trachea and phonation normal.  Cardiovascular:     Rate and Rhythm: Normal rate and regular rhythm.     Heart sounds: Normal heart sounds, S1 normal and S2 normal.  Pulmonary:      Effort: Pulmonary effort is normal. No respiratory distress.     Breath sounds: Normal breath sounds and air entry. No wheezing, rhonchi or rales.  Chest:     Chest wall: No tenderness.  Musculoskeletal:     Cervical back: Neck supple.  Lymphadenopathy:     Cervical: Cervical adenopathy present.  Skin:    General: Skin is warm and dry.     Capillary Refill: Capillary refill takes less than 2 seconds.     Findings: No rash.  Neurological:     General: No focal deficit present.     Mental Status: She is alert and oriented to person, place, and time. Mental status is at baseline.     Cranial Nerves: No dysarthria or facial asymmetry.  Psychiatric:        Mood and Affect: Mood normal.        Speech: Speech normal.        Behavior: Behavior normal.        Thought Content: Thought content normal.        Judgment: Judgment normal.      UC Treatments / Results  Labs (all labs ordered are listed, but only abnormal results are displayed) Labs Reviewed  POC COVID19/FLU A&B COMBO - Normal  POCT RAPID STREP A (OFFICE) - Normal  CULTURE, GROUP A STREP [facility])    EKG   Radiology No results found.  Procedures Procedures (including critical care time)  Medications Ordered in UC Medications - No data to display  Initial Impression / Assessment and Plan / UC Course  I have reviewed the triage vital signs and the nursing notes.  Pertinent labs & imaging results that were available during my care of the patient were reviewed by me and considered in my medical decision making (see chart  for details).   1. Viral pharyngitis Evaluation suggests viral pharyngitis etiology.   Group A strep POC testing negative, throat culture pending, will treat based on throat culture results for bacterial pharyngitis if indicated. COVID and flu rapid testing negative.  Low suspicion for mononucleosis, epiglottitis, peritonsillar abscess, etc.  HEENT exam stable without red flag signs. Will manage  this conservatively with supportive care as outlined in AVS.  Salt water gargles as needed, warm water with honey.    Counseled patient on potential for adverse effects with medications prescribed/recommended today, strict ER and return-to-clinic precautions discussed, patient verbalized understanding.    Final Clinical Impressions(s) / UC Diagnoses   Final diagnoses:  Viral pharyngitis  Acute cough     Discharge Instructions      You have a viral illness which will improve on its own with rest, fluids, and medications to help with your symptoms.  Tylenol, guaifenesin (plain mucinex), and saline nasal sprays may help relieve symptoms.   Two teaspoons of honey in 1 cup of warm water every 4-6 hours may help with throat pains.  Humidifier in room at nighttime may help soothe cough (clean well daily).   For chest pain, shortness of breath, inability to keep food or fluids down without vomiting, fever that does not respond to tylenol or motrin, or any other severe symptoms, please go to the ER for further evaluation. Return to urgent care as needed, otherwise follow-up with PCP.       ED Prescriptions     Medication Sig Dispense Auth. Provider   ondansetron (ZOFRAN-ODT) 4 MG disintegrating tablet Take 1 tablet (4 mg total) by mouth every 8 (eight) hours as needed for nausea or vomiting. 20 tablet Carlisle Beers, FNP      PDMP not reviewed this encounter.   Carlisle Beers, [place] 11/04/23 1406

## 2023-11-04 NOTE — ED Triage Notes (Signed)
 Sore throat x 4 days, left side of neck painful to touch, vomiting, dizziness, chills, headache x 1 day.

## 2023-11-04 NOTE — Discharge Instructions (Signed)

## 2023-11-07 ENCOUNTER — Telehealth (HOSPITAL_COMMUNITY): Payer: Self-pay

## 2023-11-07 ENCOUNTER — Other Ambulatory Visit: Payer: Self-pay

## 2023-11-07 LAB — CULTURE, GROUP A STREP (THRC)

## 2023-11-07 MED ORDER — AMOXICILLIN 500 MG PO CAPS
500.0000 mg | ORAL_CAPSULE | Freq: Two times a day (BID) | ORAL | 0 refills | Status: AC
  Filled 2023-11-07: qty 20, 10d supply, fill #0

## 2023-11-07 NOTE — Telephone Encounter (Signed)
 Per protocol, pt requires treatment with Amoxicillin. Attempted to reach patient x1. LVM.  Rx sent to pharmacy on file.

## 2023-12-25 ENCOUNTER — Ambulatory Visit: Payer: 59 | Admitting: Family Medicine

## 2023-12-27 ENCOUNTER — Other Ambulatory Visit: Payer: Self-pay

## 2023-12-27 MED ORDER — HYDROXYCHLOROQUINE SULFATE 200 MG PO TABS
200.0000 mg | ORAL_TABLET | Freq: Two times a day (BID) | ORAL | 2 refills | Status: AC
  Filled 2023-12-27: qty 60, 30d supply, fill #0
  Filled 2024-03-26 – 2024-06-20 (×2): qty 60, 30d supply, fill #1
  Filled 2024-09-03: qty 60, 30d supply, fill #2

## 2023-12-29 ENCOUNTER — Other Ambulatory Visit: Payer: Self-pay | Admitting: Family Medicine

## 2023-12-29 DIAGNOSIS — F410 Panic disorder [episodic paroxysmal anxiety] without agoraphobia: Secondary | ICD-10-CM

## 2023-12-29 DIAGNOSIS — F341 Dysthymic disorder: Secondary | ICD-10-CM

## 2023-12-30 ENCOUNTER — Other Ambulatory Visit: Payer: Self-pay

## 2024-01-01 ENCOUNTER — Ambulatory Visit: Admitting: Family Medicine

## 2024-01-01 ENCOUNTER — Other Ambulatory Visit: Payer: Self-pay

## 2024-01-01 ENCOUNTER — Encounter: Payer: Self-pay | Admitting: Family Medicine

## 2024-01-01 VITALS — BP 124/78 | HR 89 | Resp 16 | Ht 66.0 in | Wt 215.0 lb

## 2024-01-01 DIAGNOSIS — I10 Essential (primary) hypertension: Secondary | ICD-10-CM | POA: Insufficient documentation

## 2024-01-01 DIAGNOSIS — E66811 Obesity, class 1: Secondary | ICD-10-CM | POA: Insufficient documentation

## 2024-01-01 DIAGNOSIS — F410 Panic disorder [episodic paroxysmal anxiety] without agoraphobia: Secondary | ICD-10-CM | POA: Insufficient documentation

## 2024-01-01 DIAGNOSIS — M3500 Sicca syndrome, unspecified: Secondary | ICD-10-CM | POA: Diagnosis not present

## 2024-01-01 DIAGNOSIS — M0609 Rheumatoid arthritis without rheumatoid factor, multiple sites: Secondary | ICD-10-CM | POA: Insufficient documentation

## 2024-01-01 DIAGNOSIS — K2100 Gastro-esophageal reflux disease with esophagitis, without bleeding: Secondary | ICD-10-CM

## 2024-01-01 DIAGNOSIS — F341 Dysthymic disorder: Secondary | ICD-10-CM

## 2024-01-01 DIAGNOSIS — J301 Allergic rhinitis due to pollen: Secondary | ICD-10-CM | POA: Insufficient documentation

## 2024-01-01 MED ORDER — OMEPRAZOLE 20 MG PO CPDR
20.0000 mg | DELAYED_RELEASE_CAPSULE | Freq: Every day | ORAL | 1 refills | Status: DC
  Filled 2024-01-01: qty 90, 90d supply, fill #0
  Filled 2024-03-26: qty 90, 90d supply, fill #1

## 2024-01-01 MED ORDER — DULOXETINE HCL 60 MG PO CPEP
60.0000 mg | ORAL_CAPSULE | Freq: Every day | ORAL | 1 refills | Status: DC
  Filled 2024-01-01: qty 90, 90d supply, fill #0
  Filled 2024-03-26: qty 90, 90d supply, fill #1

## 2024-01-01 NOTE — Progress Notes (Signed)
 Name: Jill Cook   MRN: [id number]    DOB: 1965/11/07   Date:01/01/2024       Progress Note  Subjective  Chief Complaint  Chief Complaint  Patient presents with   Medical Management of Chronic Issues   Discussed the use of AI scribe software for clinical note transcription with the patient, who gave verbal consent to proceed.  History of Present Illness Jill Cook is a 58 year old female with rheumatoid arthritis and Sjogren's syndrome who presents for follow-up of her autoimmune conditions.  She was diagnosed with rheumatoid arthritis affecting multiple sites and Sjogren's syndrome, characterized by dry mouth, dry eyes, and vaginal dryness. She has a negative rheumatoid factor. She recently started Plaquenil  three days ago and is monitoring for symptom improvement, which is expected to take six weeks to three months.  She has a family history of autoimmune disorders, which facilitated early diagnosis and treatment. She reports weight loss from 220 pounds to 215 pounds since her last visit in February, attributed to adopting a Mediterranean style of cooking. Her BMI has decreased to just under 35.  Her blood pressure has improved from 148/92 to 124/78, and she is currently taking metoprolol . No chest pain or palpitations.  She experiences gastroesophageal reflux disease (GERD) symptoms, including hoarseness and discomfort when eating certain foods, possibly related to Sjogren's syndrome. She takes omeprazole  over the counter and finds it somewhat helpful, particularly for dry foods like bread. She also uses Pepcid  as needed.  She has a history of anxiety and depression, with recent improvement following an increase in duloxetine  from 30 mg to 60 mg daily. No recent crying spells.  She has been menopausal since her late thirties, following an ablation in her forties, and experiences vaginal dryness, which may be exacerbated by Sjogren's syndrome.  She has seasonal  allergies, particularly to tree pollen, and uses Nasacort  and Astelin  nasal sprays, which sometimes cause nasal sores treated with mupirocin  ointment.  She reports improvement in irritable bowel syndrome symptoms, attributing better bowel movements to dietary changes.    Patient Active Problem List   Diagnosis Date Noted   History of endometriosis 04/15/2020   History of hypertension 05/11/2019   Palpitations 05/11/2019   Bilateral carpal tunnel syndrome 01/08/2019   Polyp of colon 01/08/2019   Family history of colorectal cancer 01/08/2019   Obese 08/23/2015   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 08/22/2015   Generalized anxiety disorder 08/17/2015   Depression 08/17/2015   Gastroesophageal reflux disease with esophagitis without hemorrhage 10/14/2008   Irritable bowel syndrome 04/12/2008    Past Surgical History:  Procedure Laterality Date   ANAL FISSURE REPAIR  06/13/2000   ARTHRODESIS METATARSAL Left 01/05/2022   [facility]   BONE CYST EXCISION Left 01/05/2022   [facility]   BUNIONECTOMY Right 07/30/1989   CESAREAN SECTION  07/30/1992   COLONOSCOPY WITH PROPOFOL  N/A 06/03/2019   Procedure: COLONOSCOPY WITH PROPOFOL ;  Surgeon: Irby Mannan, MD;  Location: [facility];  Service: Endoscopy;  Laterality: N/A;   CONVERSION TO TOTAL KNEE Right 08/22/2015   Procedure: partial knee CONVERSION TO RIGHT TOTAL KNEE;  Surgeon: Claiborne Crew, MD;  Location: [facility];  Service: Orthopedics;  Laterality: Right;   CYST REMOVAL WITH BONE GRAFT Left 01/05/2022   [facility]   D & C HYSTEROSCOPY /  NOVASURE ENDOMETRIAL ABLATION  11/15/2005   ESOPHAGOGASTRODUODENOSCOPY (EGD) WITH PROPOFOL  N/A 06/03/2019   Procedure: ESOPHAGOGASTRODUODENOSCOPY (EGD) WITH PROPOFOL ;  Surgeon: Irby Mannan, MD;  Location: [facility];  Service: Endoscopy;  Laterality: N/A;   HAMMER TOE SURGERY Left 01/05/2022   KNEE ARTHROSCOPY W/ MENISCECTOMY Right    KNEE ARTHROSCOPY WITH MEDIAL MENISECTOMY Right  10/06/2013   Procedure: RIGHT KNEE ARTHROSCOPY WITH CHONDROPLASTY;  Surgeon: Genevie Kerns, MD;  Location: [facility];  Service: Orthopedics;  Laterality: Right;   LAPAROSCOPY N/A 11/01/2015   Procedure: LAPAROSCOPY DIAGNOSTIC;  Surgeon: Woodrow Hazy, MD;  Location: [facility];  Service: Gynecology;  Laterality: N/A;   RIGHT KNEE PATELLOFEMORAL ARTHROPLASTY  06/10/2008   SEPTOPLASTY  07/30/1998   TONSILLECTOMY  AGE 58   TRANSTHORACIC ECHOCARDIOGRAM  06/03/2008   NORMAL /  EF 60%   TUBAL LIGATION  07/30/1994    Family History  Problem Relation Age of Onset   Hypertension Mother    Thyroid  disease Mother    Atrial fibrillation Mother    Stroke Mother    Heart disease Father    Heart attack Father 48   Colon cancer Maternal Aunt        dx in her 75's   Breast cancer Paternal Aunt    Uterine cancer Paternal Aunt    Esophageal cancer Maternal Grandfather    Hypertension Paternal Grandfather    Diabetes Paternal Grandfather     Social History   Tobacco Use   Smoking status: Never   Smokeless tobacco: Never  Substance Use Topics   Alcohol use: Yes    Comment: 1 drink per month     Current Outpatient Medications:    albuterol  (VENTOLIN  HFA) 108 (90 Base) MCG/ACT inhaler, Inhale 2 puffs into the lungs every 6 (six) hours as needed for wheezing or shortness of breath., Disp: 6.7 g, Rfl: 0   ALPRAZolam  (XANAX ) 0.5 MG tablet, Take 1 tablet (0.5 mg total) by mouth daily as needed for anxiety., Disp: 5 tablet, Rfl: 0   aspirin  EC 81 MG tablet, Take 81 mg by mouth daily., Disp: , Rfl:    azelastine  (ASTELIN ) 0.1 % nasal spray, Place 2 sprays into both nostrils every 12 (twelve) hours as needed for drainage., Disp: 30 mL, Rfl: 11   Cholecalciferol (VITAMIN D ) 50 MCG (2000 UT) tablet, Take 2,000 Units by mouth daily., Disp: , Rfl:    DULoxetine  (CYMBALTA ) 60 MG capsule, Take 1 capsule (60 mg total) by mouth daily., Disp: 90 capsule, Rfl: 0    famotidine  (PEPCID ) 20 MG tablet, Take 1 tablet (20 mg total) by mouth daily., Disp: 90 tablet, Rfl: 0   hydroxychloroquine  (PLAQUENIL ) 200 MG tablet, Take 1 tablet (200 mg total) by mouth 2 (two) times daily with food., Disp: 60 tablet, Rfl: 2   metoprolol  succinate (TOPROL -XL) 25 MG 24 hr tablet, Take 1 tablet (25 mg total) by mouth daily., Disp: 90 tablet, Rfl: 2   mupirocin  ointment (BACTROBAN ) 2 %, Apply 1 Application topically daily as needed., Disp: , Rfl:    ondansetron  (ZOFRAN -ODT) 4 MG disintegrating tablet, Take 1 tablet (4 mg total) by mouth every 8 (eight) hours as needed for nausea or vomiting., Disp: 20 tablet, Rfl: 0   triamcinolone  (NASACORT ) 55 MCG/ACT AERO nasal inhaler, Place 2 sprays into the nose daily., Disp: 32.4 mL, Rfl: 11  Allergies  Allergen Reactions   Codeine Nausea And Vomiting and Hives   Morphine  Hives and Nausea And Vomiting   Hydrocodone  Nausea Only   Hydromorphone  Hcl Nausea Only    I personally reviewed active problem list, medication list, allergies, family history with the patient/caregiver today.   ROS  Ten systems  reviewed and is negative except as mentioned in HPI    Objective Physical Exam Constitutional: Patient appears well-developed and well-nourished. No distress.  HEENT: head atraumatic, normocephalic, pupils equal and reactive to light, neck supple Cardiovascular: Normal rate, regular rhythm and normal heart sounds.  No murmur heard. No BLE edema. Pulmonary/Chest: Effort normal and breath sounds normal. No respiratory distress. Abdominal: Soft.  There is no tenderness. Psychiatric: Patient has a normal mood and affect. behavior is normal. Judgment and thought content normal.    VITALS: BP- 124/78 MEASUREMENTS: Weight- 215, BMI- 34.9. CONSTITUTIONAL: Patient appears well-developed and well-nourished.  No distress. HEENT: Head atraumatic, normocephalic, neck supple. CARDIOVASCULAR: Normal rate, regular rhythm and normal heart sounds.   No murmur heard. No BLE edema. PULMONARY: Effort normal and breath sounds normal. No respiratory distress. ABDOMINAL: There is no tenderness or distention. MUSCULOSKELETAL: Normal gait. Without gross motor or sensory deficit. PSYCHIATRIC: Patient has a normal mood and affect. behavior is normal. Judgment and thought content normal.  Vitals:   01/01/24 1413  BP: 124/78  Pulse: 89  Resp: 16  SpO2: 99%  Weight: 215 lb (97.5 kg)  Height: 5\' 6"  (1.676 m)    Body mass index is 34.7 kg/m.  Recent Results (from the past 2160 hours)  POC Covid19/Flu A&B Antigen     Status: Normal   Collection Time: 11/04/23  1:56 PM  Result Value Ref Range   Influenza A Antigen, POC Negative    Influenza B Antigen, POC Negative    Covid Antigen, POC Negative   POCT rapid strep A     Status: Normal   Collection Time: 11/04/23  2:03 PM  Result Value Ref Range   Rapid Strep A Screen Negative   Culture, group A strep     Status: None   Collection Time: 11/04/23  2:07 PM   Specimen: Throat  Result Value Ref Range   Specimen Description      THROAT Performed at [facility], [address], [place], [place] [address]    Special Requests      NONE Performed at [facility], [address] [place], [place] [address]    Culture      MODERATE GROUP A STREP (S.PYOGENES) ISOLATED Beta hemolytic streptococci are predictably susceptible to penicillin and other beta lactams. Susceptibility testing not routinely performed. Performed at [facility], [address] [place], [place] [address]    Report Status 11/07/2023 FINAL       PHQ2/9:    01/01/2024    2:12 PM 09/27/2023   10:41 AM 03/13/2023   10:05 AM 11/21/2022    3:22 PM 03/28/2022    2:04 PM  Depression screen PHQ 2/9  Decreased Interest 0 1 0 0 0  Down, Depressed, Hopeless 0 1 0 0 0  PHQ - 2 Score 0 2 0 0 0  Altered sleeping 0 1 0 0 0  Tired, decreased energy 0 1 2 0 0  Change in appetite 0 0 0 0 0  Feeling bad or failure about  yourself  0 0 0 0 0  Trouble concentrating 0 1 0 0 0  Moving slowly or fidgety/restless 0 0 0 0 0  Suicidal thoughts 0 0 0 0 0  PHQ-9 Score 0 5 2 0 0  Difficult doing work/chores Not difficult at all Somewhat difficult  Not difficult at all Not difficult at all    phq 9 is negative  Fall Risk:    01/01/2024    2:08  PM 03/13/2023   10:05 AM 11/21/2022    3:22 PM 03/28/2022    2:03 PM 10/31/2021    1:06 PM  Fall Risk   Falls in the past year? 0 0 0 0 0  Number falls in past yr: 0 0 0 0 0  Injury with Fall? 0 0 0 0 0  Risk for fall due to : No Fall Risks No Fall Risks No Fall Risks No Fall Risks No Fall Risks  Follow up Falls prevention discussed;Education provided;Falls evaluation completed Falls prevention discussed Falls prevention discussed;Education provided;Falls evaluation completed Falls prevention discussed;Education provided Falls prevention discussed      Assessment & Plan Rheumatoid Arthritis Diagnosed with rheumatoid arthritis, negative rheumatoid factor. Started Plaquenil , benefits expected in 6 weeks to 3 months. Discussed immune function monitoring. - Continue Plaquenil  as prescribed. - Monitor for infection or autoimmune symptoms. - Be aware of potential fatigue.  Sjogren's Syndrome Secondary Sjogren's syndrome with xerostomia, keratoconjunctivitis sicca, and vaginal dryness. Discussed Plaquenil 's potential benefits and topical estrogen for persistent vaginal dryness. - Consider topical estrogen for vaginal dryness if symptoms persist.  Gastroesophageal Reflux Disease (GERD) Intermittent GERD symptoms, possibly exacerbated by Sjogren's. Current relief with OTC omeprazole . Discussed GI referral if symptoms persist post-Plaquenil . - Prescribe omeprazole  20 mg, 90 pills with one refill. - Consider GI referral if symptoms persist after 3 months of Plaquenil .  Hypertension Blood pressure controlled with metoprolol , currently 124/78 mmHg. - Continue metoprolol  as  prescribed.  Anxiety and Depression Improvement in anxiety and depression with increased duloxetine  dose, no recent crying spells. - Prescribe duloxetine  60 mg, 90 pills with one refill.  Menopausal Symptoms Postmenopausal, discussed hormone replacement therapy benefits and risks, especially for vaginal dryness with Sjogren's. - Consider topical estrogen for vaginal dryness if symptoms persist.  General Health Maintenance Weight loss achieved, BMI reduced. Discussed benefits of Mediterranean diet and continued weight loss. - Encourage continuation of Mediterranean diet. - Encourage weight loss and healthy lifestyle.

## 2024-03-25 ENCOUNTER — Telehealth: Admitting: Physician Assistant

## 2024-03-25 DIAGNOSIS — B3731 Acute candidiasis of vulva and vagina: Secondary | ICD-10-CM | POA: Diagnosis not present

## 2024-03-26 ENCOUNTER — Other Ambulatory Visit: Payer: Self-pay

## 2024-03-26 MED ORDER — FLUCONAZOLE 150 MG PO TABS
ORAL_TABLET | ORAL | 0 refills | Status: DC
  Filled 2024-03-26: qty 2, 1d supply, fill #0

## 2024-03-26 MED ORDER — HYDROXYCHLOROQUINE SULFATE 200 MG PO TABS
200.0000 mg | ORAL_TABLET | Freq: Two times a day (BID) | ORAL | 0 refills | Status: DC
  Filled 2024-03-26: qty 180, 90d supply, fill #0

## 2024-03-26 NOTE — Progress Notes (Signed)
 I have spent 5 minutes in review of e-visit questionnaire, review and updating patient chart, medical decision making and response to patient.   Elsie Velma Lunger, PA-C

## 2024-03-26 NOTE — Progress Notes (Signed)

## 2024-03-27 ENCOUNTER — Other Ambulatory Visit: Payer: Self-pay

## 2024-04-21 ENCOUNTER — Other Ambulatory Visit: Payer: Self-pay | Admitting: Family Medicine

## 2024-04-21 ENCOUNTER — Other Ambulatory Visit: Payer: Self-pay

## 2024-04-21 MED ORDER — PREDNISONE 5 MG PO TABS
ORAL_TABLET | ORAL | 0 refills | Status: AC
  Filled 2024-04-21: qty 21, 6d supply, fill #0

## 2024-04-22 ENCOUNTER — Other Ambulatory Visit: Payer: Self-pay

## 2024-04-22 MED ORDER — FAMOTIDINE 20 MG PO TABS
20.0000 mg | ORAL_TABLET | Freq: Every day | ORAL | 0 refills | Status: AC
  Filled 2024-04-22: qty 90, 90d supply, fill #0

## 2024-06-17 ENCOUNTER — Other Ambulatory Visit: Payer: Self-pay

## 2024-06-17 MED ORDER — PREDNISONE 10 MG PO TABS
ORAL_TABLET | ORAL | 0 refills | Status: AC
  Filled 2024-06-17: qty 21, 6d supply, fill #0

## 2024-07-01 ENCOUNTER — Other Ambulatory Visit: Payer: Self-pay

## 2024-07-01 MED ORDER — METHOTREXATE SODIUM 2.5 MG PO TABS
10.0000 mg | ORAL_TABLET | ORAL | 2 refills | Status: AC
  Filled 2024-07-01: qty 16, 28d supply, fill #0
  Filled 2024-07-25: qty 16, 28d supply, fill #1
  Filled 2024-09-03: qty 16, 28d supply, fill #2

## 2024-07-01 MED ORDER — FOLIC ACID 1 MG PO TABS
1.0000 mg | ORAL_TABLET | Freq: Every day | ORAL | 3 refills | Status: AC
  Filled 2024-07-01: qty 90, 90d supply, fill #0
  Filled 2024-09-03: qty 90, 90d supply, fill #1

## 2024-07-01 MED ORDER — HYDROXYCHLOROQUINE SULFATE 200 MG PO TABS
200.0000 mg | ORAL_TABLET | Freq: Two times a day (BID) | ORAL | 0 refills | Status: AC
  Filled 2024-07-01: qty 180, 90d supply, fill #0

## 2024-07-03 ENCOUNTER — Other Ambulatory Visit: Payer: Self-pay

## 2024-07-03 ENCOUNTER — Other Ambulatory Visit (HOSPITAL_COMMUNITY)
Admission: RE | Admit: 2024-07-03 | Discharge: 2024-07-03 | Disposition: A | Discharge status: Home / Self Care | Source: Ambulatory Visit | Attending: Family Medicine | Admitting: Family Medicine

## 2024-07-03 ENCOUNTER — Encounter: Payer: Self-pay | Admitting: Family Medicine

## 2024-07-03 ENCOUNTER — Ambulatory Visit: Admitting: Family Medicine

## 2024-07-03 VITALS — BP 130/80 | HR 92 | Resp 16 | Ht 66.0 in | Wt 220.9 lb

## 2024-07-03 DIAGNOSIS — K2100 Gastro-esophageal reflux disease with esophagitis, without bleeding: Secondary | ICD-10-CM | POA: Diagnosis not present

## 2024-07-03 DIAGNOSIS — Z1211 Encounter for screening for malignant neoplasm of colon: Secondary | ICD-10-CM

## 2024-07-03 DIAGNOSIS — E2839 Other primary ovarian failure: Secondary | ICD-10-CM | POA: Diagnosis not present

## 2024-07-03 DIAGNOSIS — M3500 Sicca syndrome, unspecified: Secondary | ICD-10-CM

## 2024-07-03 DIAGNOSIS — Z0001 Encounter for general adult medical examination with abnormal findings: Secondary | ICD-10-CM

## 2024-07-03 DIAGNOSIS — E66812 Obesity, class 2: Secondary | ICD-10-CM

## 2024-07-03 DIAGNOSIS — M0609 Rheumatoid arthritis without rheumatoid factor, multiple sites: Secondary | ICD-10-CM

## 2024-07-03 DIAGNOSIS — F410 Panic disorder [episodic paroxysmal anxiety] without agoraphobia: Secondary | ICD-10-CM

## 2024-07-03 DIAGNOSIS — F341 Dysthymic disorder: Secondary | ICD-10-CM | POA: Diagnosis not present

## 2024-07-03 DIAGNOSIS — Z6835 Body mass index (BMI) 35.0-35.9, adult: Secondary | ICD-10-CM | POA: Diagnosis not present

## 2024-07-03 DIAGNOSIS — Z124 Encounter for screening for malignant neoplasm of cervix: Secondary | ICD-10-CM

## 2024-07-03 DIAGNOSIS — Z23 Encounter for immunization: Secondary | ICD-10-CM | POA: Diagnosis not present

## 2024-07-03 DIAGNOSIS — Z0000 Encounter for general adult medical examination without abnormal findings: Secondary | ICD-10-CM

## 2024-07-03 DIAGNOSIS — Z1231 Encounter for screening mammogram for malignant neoplasm of breast: Secondary | ICD-10-CM

## 2024-07-03 DIAGNOSIS — I10 Essential (primary) hypertension: Secondary | ICD-10-CM | POA: Diagnosis not present

## 2024-07-03 MED ORDER — OMEPRAZOLE 20 MG PO CPDR
20.0000 mg | DELAYED_RELEASE_CAPSULE | Freq: Every day | ORAL | 1 refills | Status: AC
  Filled 2024-07-03 – 2024-07-21 (×2): qty 90, 90d supply, fill #0

## 2024-07-03 MED ORDER — DULOXETINE HCL 60 MG PO CPEP
60.0000 mg | ORAL_CAPSULE | Freq: Every day | ORAL | 1 refills | Status: AC
  Filled 2024-07-03 – 2024-07-21 (×2): qty 90, 90d supply, fill #0

## 2024-07-03 MED ORDER — ALPRAZOLAM 0.5 MG PO TABS
0.5000 mg | ORAL_TABLET | Freq: Every day | ORAL | 0 refills | Status: AC | PRN
  Filled 2024-07-03 – 2024-07-21 (×2): qty 5, 5d supply, fill #0

## 2024-07-03 NOTE — Progress Notes (Signed)
 Name: Jill Cook   MRN: [id number]    DOB: Sep 10, 1965   Date:07/03/2024       Progress Note  Subjective  Chief Complaint  Chief Complaint  Patient presents with   Medical Management of Chronic Issues    HPI  Patient presents for annual CPE and follow up  Discussed the use of AI scribe software for clinical note transcription with the patient, who gave verbal consent to proceed.  History of Present Illness Jill Cook is a 58 year old female with rheumatoid arthritis and hypertension who presents for a physical exam and medication refills.  She has rheumatoid arthritis affecting multiple sites with a negative rheumatoid factor. She takes Plaquenil  200 mg twice daily, which partially alleviates her symptoms. She experiences daily swelling and stiffness in her hands, with variability in severity. Methotrexate  10 mg weekly has been prescribed but not yet initiated. She also takes folic acid  due to methotrexate  use.  She has Sjogren's syndrome, experiencing dry mouth and dry eyes. No vaginal dryness is reported, and she has not been sexually active for a long time due to her husband's erectile dysfunction.  Her hypertension is managed with metoprolol . No chest pain, palpitations, or shortness of breath.  For gastroesophageal reflux disease, she takes omeprazole  20 mg daily and has not experienced heartburn or indigestion recently.  She has a history of vitamin D  deficiency and takes vitamin D  over the counter. Duloxetine  is used for mood and musculoskeletal pain. She feels down but does not experience major depression. She has had two panic attacks in the last year and previously used Xanax  for panic attacks.  She does not have asthma and uses albuterol  only as needed for past bronchitis episodes. She denies smoking and has never smoked.  Her BMI is over 35. Her diet includes yogurt for breakfast, sandwiches or salads for lunch, and variable dinners due to a busy  schedule. She has limited physical activity due to pain but is considering options like water  aerobics or yoga.  She received a flu shot today and feels safe in her relationship. She is postmenopausal and aware of the need to report any vaginal bleeding due to a family history of uterine cancer. Her family history includes breast cancer in a paternal aunt, uterine cancer in another paternal aunt, and colon cancer in a maternal uncle.  She drinks alcohol occasionally, less than once a month, and has not had any skin cancer herself, though her daughter has had two melanomas removed.      Diet: balanced  Exercise: discussed regular physical active   Last Eye Exam: completed Last Dental Exam: completed  Flowsheet Row Office Visit from 07/03/2024 in [facility]  AUDIT-C Score 1   Depression: Phq 9 is  negative    07/03/2024    2:00 PM 01/01/2024    2:12 PM 09/27/2023   10:41 AM 03/13/2023   10:05 AM 11/21/2022    3:22 PM  Depression screen PHQ 2/9  Decreased Interest 0 0 1 0 0  Down, Depressed, Hopeless 0 0 1 0 0  PHQ - 2 Score 0 0 2 0 0  Altered sleeping 0 0 1 0 0  Tired, decreased energy 0 0 1 2 0  Change in appetite 0 0 0 0 0  Feeling bad or failure about yourself  0 0 0 0 0  Trouble concentrating 0 0 1 0 0  Moving slowly or fidgety/restless 0 0 0 0 0  Suicidal thoughts  0 0 0 0 0  PHQ-9 Score 0 0  5  2  0   Difficult doing work/chores Not difficult at all Not difficult at all Somewhat difficult  Not difficult at all     Data saved with a previous flowsheet row definition   Hypertension: BP Readings from Last 3 Encounters:  07/03/24 130/80  01/01/24 124/78  11/04/23 127/85   Obesity: Wt Readings from Last 3 Encounters:  07/03/24 220 lb 14.4 oz (100.2 kg)  01/01/24 215 lb (97.5 kg)  09/27/23 220 lb 6.4 oz (100 kg)   BMI Readings from Last 3 Encounters:  07/03/24 35.65 kg/m  01/01/24 34.70 kg/m  09/27/23 35.57 kg/m     Vaccines: reviewed  with the patient.   Hep C Screening: completed STD testing and prevention (HIV/chl/gon/syphilis): N/A Intimate partner violence: negative screen  Sexual History : not in a while , husband has ED Menstrual History/LMP/Abnormal Bleeding: post menopausal  Discussed importance of follow up if any post-menopausal bleeding: yes  Incontinence Symptoms: negative for symptoms   Breast cancer:  - Last Mammogram: needs to schedule  - BRCA gene screening: paternal aunt had breast cancer, one paternal aunt had uterine cancer, maternal uncle had colon cancer   Osteoporosis Prevention : Discussed high calcium  and vitamin D  supplementation, weight bearing exercises Bone density :yes   Cervical cancer screening: performing today  Skin cancer: Discussed monitoring for atypical lesions  Colorectal cancer: referral placed for GI   Lung cancer:  Low Dose CT Chest recommended if Age 19-80 years, 20 pack-year currently smoking OR have quit w/in 15years. Patient does not qualify for screen   ECG: 2022  Advanced Care Planning: A voluntary discussion about advance care planning including the explanation and discussion of advance directives.  Discussed health care proxy and Living will, and the patient was able to identify a health care proxy as  husband .  Patient does not have a living will and power of attorney of health care   Patient Active Problem List   Diagnosis Date Noted   Seasonal allergic rhinitis due to pollen 01/01/2024   Panic attack 01/01/2024   Obesity (BMI 30.0-34.9) 01/01/2024   Essential hypertension 01/01/2024   Secondary Sjogren's syndrome 01/01/2024   Rheumatoid arthritis of multiple sites with negative rheumatoid factor (HCC) 01/01/2024   History of endometriosis 04/15/2020   History of hypertension 05/11/2019   Palpitations 05/11/2019   Bilateral carpal tunnel syndrome 01/08/2019   Polyp of colon 01/08/2019   Family history of colorectal cancer 01/08/2019   Obese 08/23/2015    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 08/22/2015   Generalized anxiety disorder 08/17/2015   Dysthymia 08/17/2015   Gastroesophageal reflux disease with esophagitis without hemorrhage 10/14/2008   Irritable bowel syndrome 04/12/2008    Past Surgical History:  Procedure Laterality Date   ANAL FISSURE REPAIR  06/13/2000   ARTHRODESIS METATARSAL Left 01/05/2022   [facility]   BONE CYST EXCISION Left 01/05/2022   [facility]   BUNIONECTOMY Right 07/30/1989   CESAREAN SECTION  07/30/1992   COLONOSCOPY WITH PROPOFOL  N/A 06/03/2019   Procedure: COLONOSCOPY WITH PROPOFOL ;  Surgeon: Janalyn Keene NOVAK, MD;  Location: [facility];  Service: Endoscopy;  Laterality: N/A;   CONVERSION TO TOTAL KNEE Right 08/22/2015   Procedure: partial knee CONVERSION TO RIGHT TOTAL KNEE;  Surgeon: Donnice Car, MD;  Location: [facility];  Service: Orthopedics;  Laterality: Right;   CYST REMOVAL WITH BONE GRAFT Left 01/05/2022   [facility]   D & C HYSTEROSCOPY /  NOVASURE ENDOMETRIAL ABLATION  11/15/2005   ESOPHAGOGASTRODUODENOSCOPY (EGD) WITH PROPOFOL  N/A 06/03/2019   Procedure: ESOPHAGOGASTRODUODENOSCOPY (EGD) WITH PROPOFOL ;  Surgeon: Janalyn Keene NOVAK, MD;  Location: [facility];  Service: Endoscopy;  Laterality: N/A;   HAMMER TOE SURGERY Left 01/05/2022   KNEE ARTHROSCOPY W/ MENISCECTOMY Right    KNEE ARTHROSCOPY WITH MEDIAL MENISECTOMY Right 10/06/2013   Procedure: RIGHT KNEE ARTHROSCOPY WITH CHONDROPLASTY;  Surgeon: Lamar Collet, MD;  Location: [facility];  Service: Orthopedics;  Laterality: Right;   LAPAROSCOPY N/A 11/01/2015   Procedure: LAPAROSCOPY DIAGNOSTIC;  Surgeon: Charlie Croak, MD;  Location[facility];  Service: Gynecology;  Laterality: N/A;   RIGHT KNEE PATELLOFEMORAL ARTHROPLASTY  06/10/2008   SEPTOPLASTY  07/30/1998   TONSILLECTOMY  AGE 49   TRANSTHORACIC ECHOCARDIOGRAM  06/03/2008   NORMAL /  EF 60%   TUBAL LIGATION  07/30/1994    Family History   Problem Relation Age of Onset   Hypertension Mother    Thyroid  disease Mother    Atrial fibrillation Mother    Stroke Mother    Heart disease Father    Heart attack Father 38   Colon cancer Maternal Aunt        dx in her 17's   Breast cancer Paternal Aunt    Uterine cancer Paternal Aunt    Esophageal cancer Maternal Grandfather    Hypertension Paternal Grandfather    Diabetes Paternal Grandfather     Social History   Socioeconomic History   Marital status: Married    Spouse name: Not on file   Number of children: 3   Years of education: Not on file   Highest education level: Bachelor's degree (e.g., BA, AB, BS)  Occupational History   Occupation: [employer]    Comment: Quarry Manager unit  Tobacco Use   Smoking status: Never   Smokeless tobacco: Never  Vaping Use   Vaping status: Never Used  Substance and Sexual Activity   Alcohol use: Yes    Comment: 1 drink per month   Drug use: No   Sexual activity: Yes    Partners: Male  Other Topics Concern   Not on file  Social History Narrative   Not on file   Social Drivers of Health   Financial Resource Strain: Low Risk  (11/12/2023)   Received from [facility]   Overall Financial Resource Strain (CARDIA)    Difficulty of Paying Living Expenses: Not hard at all  Food Insecurity: No Food Insecurity (11/12/2023)   [facility]   Hunger Vital Sign    Within the past 12 months, you worried that your food would run out before you got the money to buy more.: Never true    Within the past 12 months, the food you bought just didn't last and you didn't have money to get more.: Never true  Transportation Needs: No Transportation Needs (11/12/2023)   Received from [facility] - Transportation    In the past 12 months, has lack of transportation kept you from medical appointments or from getting medications?: No    Lack of Transportation  (Non-Medical): No  Physical Activity: Insufficiently Active (11/21/2022)   Exercise Vital Sign    Days of Exercise per Week: 1 day    Minutes of Exercise per Session: 20 min  Stress: No Stress Concern Present (11/21/2022)   [employer] of [employer] - Occupational Stress Questionnaire    Feeling of Stress :  Only a little  Social Connections: Socially Integrated (11/21/2022)   Social Connection and Isolation Panel    Frequency of Communication with Friends and Family: More than three times a week    Frequency of Social Gatherings with Friends and Family: More than three times a week    Attends Religious Services: More than 4 times per year    Active Member of [employer] or Organizations: Yes    Attends Engineer, Structural: More than 4 times per year    Marital Status: Married  Catering Manager Violence: Not At Risk (01/08/2019)   Humiliation, Afraid, Rape, and Kick questionnaire    Fear of Current or Ex-Partner: No    Emotionally Abused: No    Physically Abused: No    Sexually Abused: No     Current Outpatient Medications:    aspirin  EC 81 MG tablet, Take 81 mg by mouth daily., Disp: , Rfl:    azelastine  (ASTELIN ) 0.1 % nasal spray, Place 2 sprays into both nostrils every 12 (twelve) hours as needed for drainage., Disp: 30 mL, Rfl: 11   Cholecalciferol (VITAMIN D ) 50 MCG (2000 UT) tablet, Take 2,000 Units by mouth daily., Disp: , Rfl:    famotidine  (PEPCID ) 20 MG tablet, Take 1 tablet (20 mg total) by mouth daily., Disp: 90 tablet, Rfl: 0   folic acid  (FOLVITE ) 1 MG tablet, Take 1 tablet (1 mg total) by mouth daily., Disp: 90 tablet, Rfl: 3   hydroxychloroquine  (PLAQUENIL ) 200 MG tablet, Take 1 tablet (200 mg total) by mouth 2 (two) times daily with food., Disp: 60 tablet, Rfl: 2   hydroxychloroquine  (PLAQUENIL ) 200 MG tablet, Take 1 tablet (200 mg total) by mouth 2 (two) times daily. Take with food., Disp: 180 tablet, Rfl: 0   methotrexate  (RHEUMATREX) 2.5 MG tablet,  Take 4 tablets (10 mg total) by mouth once a week., Disp: 16 tablet, Rfl: 2   metoprolol  succinate (TOPROL -XL) 25 MG 24 hr tablet, Take 1 tablet (25 mg total) by mouth daily., Disp: 90 tablet, Rfl: 2   mupirocin  ointment (BACTROBAN ) 2 %, Apply 1 Application topically daily as needed., Disp: , Rfl:    triamcinolone  (NASACORT ) 55 MCG/ACT AERO nasal inhaler, Place 2 sprays into the nose daily., Disp: 32.4 mL, Rfl: 11   ALPRAZolam  (XANAX ) 0.5 MG tablet, Take 1 tablet (0.5 mg total) by mouth daily as needed for anxiety., Disp: 5 tablet, Rfl: 0   DULoxetine  (CYMBALTA ) 60 MG capsule, Take 1 capsule (60 mg total) by mouth daily., Disp: 90 capsule, Rfl: 1   omeprazole  (PRILOSEC) 20 MG capsule, Take 1 capsule (20 mg total) by mouth daily., Disp: 90 capsule, Rfl: 1  Allergies  Allergen Reactions   Codeine Nausea And Vomiting and Hives   Morphine  Hives and Nausea And Vomiting   Hydrocodone  Nausea Only   Hydromorphone  Hcl Nausea Only     ROS  Ten systems reviewed and is negative except as mentioned in HPI    Objective  Vitals:   07/03/24 1403  BP: 130/80  Pulse: 92  Resp: 16  SpO2: 93%  Weight: 220 lb 14.4 oz (100.2 kg)  Height: 5' 6 (1.676 m)    Body mass index is 35.65 kg/m.  Physical Exam  Constitutional: Patient appears well-developed and well-nourished. No distress.  HENT: Head: Normocephalic and atraumatic. Ears: B TMs ok, no erythema or effusion; Nose: Nose normal. Mouth/Throat: Oropharynx is clear and moist. No oropharyngeal exudate.  Eyes: Conjunctivae and EOM are normal. Pupils are equal, round, and  reactive to light. No scleral icterus.  Neck: Normal range of motion. Neck supple. No JVD present. No thyromegaly present.  Cardiovascular: Normal rate, regular rhythm and normal heart sounds.  No murmur heard. No BLE edema. Pulmonary/Chest: Effort normal and breath sounds normal. No respiratory distress. Abdominal: Soft. Bowel sounds are normal, no distension. There is no  tenderness. no masses Breast: no lumps or masses, no nipple discharge or rashes FEMALE GENITALIA:  External genitalia normal External urethra normal Vaginal vault normal without discharge or lesions Cervix normal without discharge or lesions Bimanual exam normal without masses RECTAL: not done  Musculoskeletal: Normal range of motion, no joint effusions. No gross deformities Neurological: he is alert and oriented to person, place, and time. No cranial nerve deficit. Coordination, balance, strength, speech and gait are normal.  Skin: Skin is warm and dry. No rash noted. No erythema.  Psychiatric: Patient has a normal mood and affect. behavior is normal. Judgment and thought content normal.      Assessment & Plan Adult Wellness Visit Due for preventive care including Pap smear, mammogram, and colonoscopy. Discussed importance of screenings due to history of polyps and family history of uterine and colon cancer. Postmenopausal and due for bone density scan. - Performed Pap smear with HPV testing. - Referred to GI for colonoscopy. - Scheduled mammogram. - Scheduled bone density scan.  Rheumatoid arthritis, multiple sites, seronegative Experiencing daily swelling and stiffness. Plaquenil  partially effective. Recently started methotrexate  and folic acid  as per rheumatologist. - Continue Plaquenil . - Continue methotrexate  10 mg weekly. - Continue folic acid  supplementation.  Sjogren syndrome Experiencing dry mouth and dry eyes. No vaginal dryness reported.  Essential hypertension Blood pressure 130/80 mmHg on metoprolol . No chest pain, palpitations, or shortness of breath. - Continue metoprolol . - Rechecked blood pressure before leaving.  Morbid obesity BMI over 35. Discussed dietary habits and importance of reducing calorie and salt intake. Encouraged physical activity despite pain limitations. - Encouraged dietary modifications to reduce calorie and salt intake. - Encouraged  physical activity such as water  aerobics or yoga.  Gastroesophageal reflux disease with esophagitis Currently on omeprazole . No recent heartburn or indigestion. Discussed tapering off omeprazole . - Continue omeprazole . - Taper omeprazole  by skipping doses gradually.  Vitamin D  deficiency Currently taking over-the-counter vitamin D  supplementation. - Continue over-the-counter vitamin D  supplementation.  Dysthymia Currently on duloxetine  for mild depressive symptoms and musculoskeletal pain. No major depression reported. - Continue duloxetine .  Panic disorder, episodic Experiencing infrequent panic attacks, two in the past year. Previously prescribed Xanax  for acute episodes. - Prescribed Xanax  5 tablets for acute panic attacks.  Other primary ovarian failure (postmenopausal state) Postmenopausal state confirmed. Discussed importance of monitoring for postmenopausal bleeding due to family history of uterine cancer. - Monitor for any postmenopausal bleeding.        -USPSTF grade A and B recommendations reviewed with patient; age-appropriate recommendations, preventive care, screening tests, etc discussed and encouraged; healthy living encouraged; see AVS for patient education given to patient -Discussed importance of 150 minutes of physical activity weekly, eat two servings of fish weekly, eat one serving of tree nuts ( cashews, pistachios, pecans, almonds.SABRA) every other day, eat 6 servings of fruit/vegetables daily and drink plenty of water  and avoid sweet beverages.   -Reviewed Health Maintenance: Yes.

## 2024-07-07 ENCOUNTER — Other Ambulatory Visit: Payer: Self-pay

## 2024-07-07 ENCOUNTER — Ambulatory Visit: Payer: Self-pay | Admitting: Family Medicine

## 2024-07-07 ENCOUNTER — Telehealth: Payer: Self-pay

## 2024-07-07 DIAGNOSIS — Z860100 Personal history of colon polyps, unspecified: Secondary | ICD-10-CM

## 2024-07-07 LAB — CYTOLOGY - PAP
Comment: NEGATIVE
Diagnosis: NEGATIVE
High risk HPV: NEGATIVE

## 2024-07-07 MED ORDER — NA SULFATE-K SULFATE-MG SULF 17.5-3.13-1.6 GM/177ML PO SOLN
1.0000 | Freq: Once | ORAL | 0 refills | Status: AC
  Filled 2024-07-07: qty 354, 1d supply, fill #0

## 2024-07-07 NOTE — Telephone Encounter (Signed)
 Gastroenterology Pre-Procedure Review  Request Date: 10/06/24 Requesting Physician: Dr. Jinny  PATIENT REVIEW QUESTIONS: The patient responded to the following health history questions as indicated:    1. Are you having any GI issues? no 2. Do you have a personal history of Polyps? yes (last colonoscopy performed by Dr. Janalyn 06/03/2019) 3. Do you have a family history of Colon Cancer or Polyps? yes (mother colon polyps, paternal great aunt colon cancer (deceased) 4. Diabetes Mellitus? no 5. Joint replacements in the past 12 months?no 6. Major health problems in the past 3 months?no 7. Any artificial heart valves, MVP, or defibrillator?no    MEDICATIONS & ALLERGIES:    Patient reports the following regarding taking any anticoagulation/antiplatelet therapy:   Plavix, Coumadin, Eliquis, Xarelto , Lovenox, Pradaxa, Brilinta, or Effient? no Aspirin ? no  Patient confirms/reports the following medications:  Current Outpatient Medications  Medication Sig Dispense Refill   ALPRAZolam  (XANAX ) 0.5 MG tablet Take 1 tablet (0.5 mg total) by mouth daily as needed for anxiety. 5 tablet 0   aspirin  EC 81 MG tablet Take 81 mg by mouth daily.     azelastine  (ASTELIN ) 0.1 % nasal spray Place 2 sprays into both nostrils every 12 (twelve) hours as needed for drainage. 30 mL 11   Cholecalciferol (VITAMIN D ) 50 MCG (2000 UT) tablet Take 2,000 Units by mouth daily.     DULoxetine  (CYMBALTA ) 60 MG capsule Take 1 capsule (60 mg total) by mouth daily. 90 capsule 1   famotidine  (PEPCID ) 20 MG tablet Take 1 tablet (20 mg total) by mouth daily. 90 tablet 0   folic acid  (FOLVITE ) 1 MG tablet Take 1 tablet (1 mg total) by mouth daily. 90 tablet 3   hydroxychloroquine  (PLAQUENIL ) 200 MG tablet Take 1 tablet (200 mg total) by mouth 2 (two) times daily with food. 60 tablet 2   hydroxychloroquine  (PLAQUENIL ) 200 MG tablet Take 1 tablet (200 mg total) by mouth 2 (two) times daily. Take with food. 180 tablet 0    methotrexate  (RHEUMATREX) 2.5 MG tablet Take 4 tablets (10 mg total) by mouth once a week. 16 tablet 2   metoprolol  succinate (TOPROL -XL) 25 MG 24 hr tablet Take 1 tablet (25 mg total) by mouth daily. 90 tablet 2   mupirocin  ointment (BACTROBAN ) 2 % Apply 1 Application topically daily as needed.     omeprazole  (PRILOSEC) 20 MG capsule Take 1 capsule (20 mg total) by mouth daily. 90 capsule 1   triamcinolone  (NASACORT ) 55 MCG/ACT AERO nasal inhaler Place 2 sprays into the nose daily. 32.4 mL 11   No current facility-administered medications for this visit.    Patient confirms/reports the following allergies:  Allergies  Allergen Reactions   Codeine Nausea And Vomiting and Hives   Morphine  Hives and Nausea And Vomiting   Hydrocodone  Nausea Only   Hydromorphone  Hcl Nausea Only    No orders of the defined types were placed in this encounter.   AUTHORIZATION INFORMATION Primary Insurance: 1D#: Group #:  Secondary Insurance: 1D#: Group #:  SCHEDULE INFORMATION: Date: 10/06/24 Time: Location: [facility]

## 2024-07-13 ENCOUNTER — Other Ambulatory Visit: Payer: Self-pay

## 2024-07-17 ENCOUNTER — Other Ambulatory Visit: Payer: Self-pay

## 2024-07-21 ENCOUNTER — Other Ambulatory Visit: Payer: Self-pay

## 2024-08-12 ENCOUNTER — Ambulatory Visit
Admission: RE | Admit: 2024-08-12 | Discharge: 2024-08-12 | Disposition: A | Discharge status: Home / Self Care | Source: Ambulatory Visit | Attending: Family Medicine | Admitting: Family Medicine

## 2024-08-12 DIAGNOSIS — Z0000 Encounter for general adult medical examination without abnormal findings: Secondary | ICD-10-CM | POA: Diagnosis present

## 2024-08-12 DIAGNOSIS — E2839 Other primary ovarian failure: Secondary | ICD-10-CM | POA: Insufficient documentation

## 2024-08-12 DIAGNOSIS — Z1231 Encounter for screening mammogram for malignant neoplasm of breast: Secondary | ICD-10-CM | POA: Diagnosis present

## 2024-09-03 ENCOUNTER — Other Ambulatory Visit: Payer: Self-pay

## 2024-09-04 ENCOUNTER — Other Ambulatory Visit: Payer: Self-pay

## 2024-10-06 ENCOUNTER — Ambulatory Visit: Admit: 2024-10-06 | Admitting: Gastroenterology

## 2024-10-06 SURGERY — COLONOSCOPY
Anesthesia: General

## 2025-01-04 ENCOUNTER — Ambulatory Visit: Admitting: Family Medicine

## 2025-07-05 ENCOUNTER — Encounter: Admitting: Family Medicine
